# Patient Record
Sex: Female | Born: 1975 | Race: Black or African American | Hispanic: No | Marital: Single | Smoking: Never smoker
Health system: Southern US, Community
[De-identification: ages and names within clinical notes are randomized; demographics above are authoritative.]

## PROBLEM LIST (undated history)

## (undated) ENCOUNTER — Inpatient Hospital Stay (HOSPITAL_COMMUNITY): Payer: Self-pay

## (undated) DIAGNOSIS — Z8619 Personal history of other infectious and parasitic diseases: Secondary | ICD-10-CM

## (undated) DIAGNOSIS — D649 Anemia, unspecified: Secondary | ICD-10-CM

## (undated) DIAGNOSIS — B999 Unspecified infectious disease: Secondary | ICD-10-CM

## (undated) DIAGNOSIS — B379 Candidiasis, unspecified: Secondary | ICD-10-CM

## (undated) DIAGNOSIS — IMO0002 Reserved for concepts with insufficient information to code with codable children: Secondary | ICD-10-CM

## (undated) DIAGNOSIS — S3993XA Unspecified injury of pelvis, initial encounter: Secondary | ICD-10-CM

## (undated) DIAGNOSIS — R87619 Unspecified abnormal cytological findings in specimens from cervix uteri: Secondary | ICD-10-CM

## (undated) DIAGNOSIS — A599 Trichomoniasis, unspecified: Secondary | ICD-10-CM

## (undated) DIAGNOSIS — A749 Chlamydial infection, unspecified: Secondary | ICD-10-CM

## (undated) DIAGNOSIS — R102 Pelvic and perineal pain: Secondary | ICD-10-CM

## (undated) DIAGNOSIS — Z8742 Personal history of other diseases of the female genital tract: Secondary | ICD-10-CM

## (undated) HISTORY — PX: GYNECOLOGIC CRYOSURGERY: SHX857

## (undated) HISTORY — DX: Trichomoniasis, unspecified: A59.9

## (undated) HISTORY — DX: Unspecified infectious disease: B99.9

## (undated) HISTORY — DX: Unspecified injury of pelvis, initial encounter: S39.93XA

## (undated) HISTORY — DX: Personal history of other infectious and parasitic diseases: Z86.19

## (undated) HISTORY — DX: Candidiasis, unspecified: B37.9

## (undated) HISTORY — DX: Pelvic and perineal pain: R10.2

## (undated) HISTORY — PX: TUBAL LIGATION: SHX77

## (undated) HISTORY — DX: Anemia, unspecified: D64.9

## (undated) HISTORY — DX: Personal history of other diseases of the female genital tract: Z87.42

---

## 1998-11-13 ENCOUNTER — Emergency Department (HOSPITAL_COMMUNITY): Admission: EM | Admit: 1998-11-13 | Discharge: 1998-11-13 | Payer: Self-pay | Admitting: Emergency Medicine

## 1999-07-25 ENCOUNTER — Other Ambulatory Visit: Admission: RE | Admit: 1999-07-25 | Discharge: 1999-07-25 | Payer: Self-pay | Admitting: Obstetrics and Gynecology

## 1999-08-20 ENCOUNTER — Inpatient Hospital Stay (HOSPITAL_COMMUNITY): Admission: AD | Admit: 1999-08-20 | Discharge: 1999-08-20 | Payer: Self-pay | Admitting: Obstetrics and Gynecology

## 2000-02-14 ENCOUNTER — Inpatient Hospital Stay (HOSPITAL_COMMUNITY): Admission: AD | Admit: 2000-02-14 | Discharge: 2000-02-16 | Payer: Self-pay | Admitting: *Deleted

## 2000-08-12 ENCOUNTER — Other Ambulatory Visit: Admission: RE | Admit: 2000-08-12 | Discharge: 2000-08-12 | Payer: Self-pay | Admitting: Obstetrics and Gynecology

## 2001-03-20 ENCOUNTER — Emergency Department (HOSPITAL_COMMUNITY): Admission: EM | Admit: 2001-03-20 | Discharge: 2001-03-20 | Payer: Self-pay | Admitting: Internal Medicine

## 2002-02-10 ENCOUNTER — Emergency Department (HOSPITAL_COMMUNITY): Admission: EM | Admit: 2002-02-10 | Discharge: 2002-02-11 | Payer: Self-pay | Admitting: Emergency Medicine

## 2002-03-13 DIAGNOSIS — Z8742 Personal history of other diseases of the female genital tract: Secondary | ICD-10-CM

## 2002-03-13 HISTORY — DX: Personal history of other diseases of the female genital tract: Z87.42

## 2002-03-23 ENCOUNTER — Other Ambulatory Visit: Admission: RE | Admit: 2002-03-23 | Discharge: 2002-03-23 | Payer: Self-pay | Admitting: Obstetrics and Gynecology

## 2002-07-09 DIAGNOSIS — Z8742 Personal history of other diseases of the female genital tract: Secondary | ICD-10-CM

## 2002-07-09 HISTORY — DX: Personal history of other diseases of the female genital tract: Z87.42

## 2002-07-22 ENCOUNTER — Encounter: Payer: Self-pay | Admitting: Obstetrics and Gynecology

## 2002-07-22 ENCOUNTER — Ambulatory Visit (HOSPITAL_COMMUNITY): Admission: RE | Admit: 2002-07-22 | Discharge: 2002-07-22 | Payer: Self-pay | Admitting: Obstetrics and Gynecology

## 2002-11-11 ENCOUNTER — Encounter: Admission: RE | Admit: 2002-11-11 | Discharge: 2002-11-11 | Payer: Self-pay | Admitting: Family Medicine

## 2002-11-17 ENCOUNTER — Encounter: Admission: RE | Admit: 2002-11-17 | Discharge: 2002-11-17 | Payer: Self-pay | Admitting: Sports Medicine

## 2002-11-24 ENCOUNTER — Encounter: Admission: RE | Admit: 2002-11-24 | Discharge: 2002-11-24 | Payer: Self-pay | Admitting: Family Medicine

## 2002-12-25 ENCOUNTER — Encounter: Admission: RE | Admit: 2002-12-25 | Discharge: 2002-12-25 | Payer: Self-pay | Admitting: Family Medicine

## 2002-12-29 ENCOUNTER — Encounter: Admission: RE | Admit: 2002-12-29 | Discharge: 2002-12-29 | Payer: Self-pay | Admitting: Sports Medicine

## 2003-02-11 ENCOUNTER — Encounter: Admission: RE | Admit: 2003-02-11 | Discharge: 2003-02-11 | Payer: Self-pay | Admitting: Sports Medicine

## 2003-12-20 ENCOUNTER — Encounter: Admission: RE | Admit: 2003-12-20 | Discharge: 2003-12-20 | Payer: Self-pay | Admitting: Sports Medicine

## 2004-01-26 ENCOUNTER — Emergency Department (HOSPITAL_COMMUNITY): Admission: EM | Admit: 2004-01-26 | Discharge: 2004-01-26 | Payer: Self-pay | Admitting: Emergency Medicine

## 2004-08-08 ENCOUNTER — Emergency Department (HOSPITAL_COMMUNITY): Admission: EM | Admit: 2004-08-08 | Discharge: 2004-08-08 | Payer: Self-pay | Admitting: Family Medicine

## 2004-09-10 DIAGNOSIS — B999 Unspecified infectious disease: Secondary | ICD-10-CM

## 2004-09-10 HISTORY — DX: Unspecified infectious disease: B99.9

## 2004-09-22 ENCOUNTER — Emergency Department (HOSPITAL_COMMUNITY): Admission: EM | Admit: 2004-09-22 | Discharge: 2004-09-22 | Payer: Self-pay | Admitting: Emergency Medicine

## 2004-12-27 ENCOUNTER — Ambulatory Visit: Payer: Self-pay | Admitting: Sports Medicine

## 2005-01-02 ENCOUNTER — Encounter (INDEPENDENT_AMBULATORY_CARE_PROVIDER_SITE_OTHER): Payer: Self-pay | Admitting: Sports Medicine

## 2005-01-02 ENCOUNTER — Ambulatory Visit: Payer: Self-pay | Admitting: Sports Medicine

## 2005-07-21 ENCOUNTER — Emergency Department (HOSPITAL_COMMUNITY): Admission: EM | Admit: 2005-07-21 | Discharge: 2005-07-21 | Payer: Self-pay | Admitting: Family Medicine

## 2005-08-01 ENCOUNTER — Emergency Department (HOSPITAL_COMMUNITY): Admission: EM | Admit: 2005-08-01 | Discharge: 2005-08-01 | Payer: Self-pay | Admitting: Family Medicine

## 2005-08-20 ENCOUNTER — Inpatient Hospital Stay (HOSPITAL_COMMUNITY): Admission: AD | Admit: 2005-08-20 | Discharge: 2005-08-20 | Payer: Self-pay | Admitting: Obstetrics and Gynecology

## 2005-09-10 HISTORY — PX: WISDOM TOOTH EXTRACTION: SHX21

## 2005-09-26 ENCOUNTER — Other Ambulatory Visit: Admission: RE | Admit: 2005-09-26 | Discharge: 2005-09-26 | Payer: Self-pay | Admitting: Obstetrics and Gynecology

## 2006-04-14 ENCOUNTER — Inpatient Hospital Stay (HOSPITAL_COMMUNITY): Admission: AD | Admit: 2006-04-14 | Discharge: 2006-04-15 | Payer: Self-pay | Admitting: Obstetrics and Gynecology

## 2006-04-16 ENCOUNTER — Inpatient Hospital Stay (HOSPITAL_COMMUNITY): Admission: AD | Admit: 2006-04-16 | Discharge: 2006-04-16 | Payer: Self-pay | Admitting: Obstetrics and Gynecology

## 2006-04-18 ENCOUNTER — Inpatient Hospital Stay (HOSPITAL_COMMUNITY): Admission: AD | Admit: 2006-04-18 | Discharge: 2006-04-19 | Payer: Self-pay | Admitting: Obstetrics and Gynecology

## 2006-04-20 ENCOUNTER — Inpatient Hospital Stay (HOSPITAL_COMMUNITY): Admission: AD | Admit: 2006-04-20 | Discharge: 2006-04-23 | Payer: Self-pay | Admitting: Obstetrics and Gynecology

## 2006-08-10 ENCOUNTER — Encounter (INDEPENDENT_AMBULATORY_CARE_PROVIDER_SITE_OTHER): Payer: Self-pay | Admitting: *Deleted

## 2006-08-10 LAB — CONVERTED CEMR LAB

## 2006-08-27 ENCOUNTER — Ambulatory Visit: Payer: Self-pay | Admitting: Sports Medicine

## 2006-08-27 ENCOUNTER — Other Ambulatory Visit: Admission: RE | Admit: 2006-08-27 | Discharge: 2006-08-27 | Payer: Self-pay | Admitting: Sports Medicine

## 2006-08-27 ENCOUNTER — Encounter (INDEPENDENT_AMBULATORY_CARE_PROVIDER_SITE_OTHER): Payer: Self-pay | Admitting: Sports Medicine

## 2006-10-16 ENCOUNTER — Emergency Department (HOSPITAL_COMMUNITY): Admission: EM | Admit: 2006-10-16 | Discharge: 2006-10-16 | Payer: Self-pay | Admitting: Family Medicine

## 2006-11-08 ENCOUNTER — Encounter (INDEPENDENT_AMBULATORY_CARE_PROVIDER_SITE_OTHER): Payer: Self-pay | Admitting: *Deleted

## 2006-11-19 ENCOUNTER — Ambulatory Visit: Payer: Self-pay | Admitting: Family Medicine

## 2007-01-08 ENCOUNTER — Telehealth (INDEPENDENT_AMBULATORY_CARE_PROVIDER_SITE_OTHER): Payer: Self-pay | Admitting: *Deleted

## 2007-01-08 ENCOUNTER — Telehealth: Payer: Self-pay | Admitting: *Deleted

## 2007-01-09 ENCOUNTER — Encounter (INDEPENDENT_AMBULATORY_CARE_PROVIDER_SITE_OTHER): Payer: Self-pay | Admitting: *Deleted

## 2007-01-10 ENCOUNTER — Telehealth: Payer: Self-pay | Admitting: *Deleted

## 2007-02-07 ENCOUNTER — Encounter: Payer: Self-pay | Admitting: Family Medicine

## 2007-03-17 ENCOUNTER — Telehealth: Payer: Self-pay | Admitting: *Deleted

## 2007-04-01 ENCOUNTER — Ambulatory Visit: Payer: Self-pay | Admitting: Family Medicine

## 2007-04-01 ENCOUNTER — Telehealth (INDEPENDENT_AMBULATORY_CARE_PROVIDER_SITE_OTHER): Payer: Self-pay | Admitting: *Deleted

## 2007-04-01 LAB — CONVERTED CEMR LAB: Beta hcg, urine, semiquantitative: NEGATIVE

## 2007-08-14 ENCOUNTER — Emergency Department (HOSPITAL_COMMUNITY): Admission: EM | Admit: 2007-08-14 | Discharge: 2007-08-14 | Payer: Self-pay | Admitting: Emergency Medicine

## 2008-06-16 ENCOUNTER — Emergency Department (HOSPITAL_COMMUNITY): Admission: EM | Admit: 2008-06-16 | Discharge: 2008-06-16 | Payer: Self-pay | Admitting: Family Medicine

## 2008-06-16 ENCOUNTER — Inpatient Hospital Stay (HOSPITAL_COMMUNITY): Admission: AD | Admit: 2008-06-16 | Discharge: 2008-06-16 | Payer: Self-pay | Admitting: Obstetrics & Gynecology

## 2008-08-23 ENCOUNTER — Emergency Department (HOSPITAL_COMMUNITY): Admission: EM | Admit: 2008-08-23 | Discharge: 2008-08-23 | Payer: Self-pay | Admitting: Family Medicine

## 2008-09-29 ENCOUNTER — Inpatient Hospital Stay (HOSPITAL_COMMUNITY): Admission: AD | Admit: 2008-09-29 | Discharge: 2008-09-29 | Payer: Self-pay | Admitting: Obstetrics & Gynecology

## 2008-10-01 ENCOUNTER — Inpatient Hospital Stay (HOSPITAL_COMMUNITY): Admission: AD | Admit: 2008-10-01 | Discharge: 2008-10-01 | Payer: Self-pay | Admitting: Obstetrics & Gynecology

## 2008-10-22 ENCOUNTER — Ambulatory Visit: Payer: Self-pay | Admitting: Obstetrics and Gynecology

## 2008-10-22 LAB — CONVERTED CEMR LAB: hCG, Beta Chain, Quant, S: <2 milliintl units/mL

## 2008-12-27 ENCOUNTER — Encounter: Payer: Self-pay | Admitting: Family Medicine

## 2009-01-19 ENCOUNTER — Encounter: Admission: RE | Admit: 2009-01-19 | Discharge: 2009-01-19 | Payer: Self-pay | Admitting: Family Medicine

## 2009-01-19 ENCOUNTER — Ambulatory Visit: Payer: Self-pay | Admitting: Family Medicine

## 2009-01-19 ENCOUNTER — Encounter: Payer: Self-pay | Admitting: Family Medicine

## 2009-01-19 ENCOUNTER — Other Ambulatory Visit: Admission: RE | Admit: 2009-01-19 | Discharge: 2009-01-19 | Payer: Self-pay | Admitting: Family Medicine

## 2009-01-19 LAB — CONVERTED CEMR LAB: Beta hcg, urine, semiquantitative: NEGATIVE

## 2009-01-21 ENCOUNTER — Encounter: Payer: Self-pay | Admitting: Family Medicine

## 2009-03-17 ENCOUNTER — Inpatient Hospital Stay (HOSPITAL_COMMUNITY): Admission: AD | Admit: 2009-03-17 | Discharge: 2009-03-17 | Payer: Self-pay | Admitting: Obstetrics & Gynecology

## 2009-04-01 ENCOUNTER — Inpatient Hospital Stay (HOSPITAL_COMMUNITY): Admission: AD | Admit: 2009-04-01 | Discharge: 2009-04-01 | Payer: Self-pay | Admitting: Obstetrics & Gynecology

## 2009-04-17 ENCOUNTER — Inpatient Hospital Stay (HOSPITAL_COMMUNITY): Admission: AD | Admit: 2009-04-17 | Discharge: 2009-04-18 | Payer: Self-pay | Admitting: Obstetrics and Gynecology

## 2009-05-01 ENCOUNTER — Inpatient Hospital Stay (HOSPITAL_COMMUNITY): Admission: AD | Admit: 2009-05-01 | Discharge: 2009-05-01 | Payer: Self-pay | Admitting: Obstetrics and Gynecology

## 2009-06-20 ENCOUNTER — Encounter: Admission: RE | Admit: 2009-06-20 | Discharge: 2009-07-14 | Payer: Self-pay | Admitting: Obstetrics and Gynecology

## 2009-10-29 ENCOUNTER — Inpatient Hospital Stay (HOSPITAL_COMMUNITY): Admission: AD | Admit: 2009-10-29 | Discharge: 2009-10-29 | Payer: Self-pay | Admitting: Obstetrics and Gynecology

## 2009-11-02 ENCOUNTER — Inpatient Hospital Stay (HOSPITAL_COMMUNITY): Admission: AD | Admit: 2009-11-02 | Discharge: 2009-11-05 | Payer: Self-pay | Admitting: Obstetrics and Gynecology

## 2010-07-11 DIAGNOSIS — R102 Pelvic and perineal pain: Secondary | ICD-10-CM

## 2010-07-11 HISTORY — DX: Pelvic and perineal pain: R10.2

## 2010-10-01 ENCOUNTER — Encounter: Payer: Self-pay | Admitting: Obstetrics & Gynecology

## 2010-10-10 NOTE — Letter (Signed)
Summary: PAP Letter  [facility]  [address]   [place], [place] [address]   Phone: [phone]  Fax: [phone]    01/21/2009  [facility] [address] [place], [place]  [address]  Dear Ms. Augusta,   Your pap smear showed no sign of cervical cancer. Your next pap should be in 1 year.         Sincerely,   Dorcas Mcmurray MD  Appended Document: PAP Letter mailed

## 2010-10-10 NOTE — Consult Note (Signed)
Summary: [facility]  [facility]   Imported By: Drucie Ip 03/05/2007 13:37:14  _____________________________________________________________________  External Attachment:    Type:   Image     Comment:   External Document

## 2010-10-10 NOTE — Assessment & Plan Note (Signed)
Summary: MEET NEW DOC/PHYSICAL/[facility]   Vital Signs:  Patient profile:   35 year old female Height:      61 inches Weight:      158.4 pounds BMI:     30.04 Temp:     98 degrees F Pulse rate:   81 / minute BP sitting:   112 / 77  (left arm)  Vitals Entered By: Marcell Barlow RN (Jan 19, 2009 10:26 AM) CC: adult CPE, Pap Is Patient Diabetic? No Pain Assessment Patient in pain? no        History of Present Illness: Here for CPE had miscarriage in January 2010--was at about 6 weeks. No complications of that--is doing pretty well--that was unplanned but wanted pregnancy. Thought it might be time to update her pap and discuss mammogram  FH Breast CA: her Mom had breast Ca in her 20s--is still living at 64 yoa w no recurrence. She has never had mammogram.  Has recurrent issues with yeast vaginitis--especially in teh summer. It resolves w otc meds.  no other current issues  Habits & Providers     Tobacco Status: never  Past History:  Past Medical History:    chlamydia--3/06    Menarche at 8 yoa, had her first child at 62 and her second at 36. [id number].  Family History:    Mom breast canccer in her 68s. She is alive at 37 (in 2010) with no recurrence.    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  The patient denies anorexia, fever, weight loss, chest pain, syncope, dyspnea on exertion, abdominal pain, melena, hematochezia, severe indigestion/heartburn, genital sores, muscle weakness, depression, abnormal bleeding, enlarged lymph nodes, and breast masses.    Physical Exam  General:  alert, well-developed, well-nourished, well-hydrated, and overweight-appearing.   Eyes:  vision grossly intact, pupils equal, pupils round,  and pupils reactive to light.   Ears:  R ear normal and L ear normal.   Mouth:  pharynx pink and moist.   Neck:  supple, full ROM, no masses, and no thyromegaly.   Breasts:  skin/areolae normal, no masses, no abnormal thickening, no nipple discharge, and no tenderness.   Lungs:  normal respiratory effort and normal breath sounds.   Heart:  normal rate, regular rhythm, and no murmur.   Abdomen:  soft and non-tender.   Genitalia:  normal introitus, no external lesions, mucosa pink and moist, no vaginal or cervical lesions, and normal uterus size and position.   Msk:  normal ROM and no joint tenderness.   Extremities:  No clubbing, cyanosis, edema, or deformity noted with normal full range of motion of all joints.   Neurologic:  no focal deficits grossly Skin:  color normal and no suspicious lesions.   Psych:  Oriented X3, good eye contact, not anxious appearing, and not depressed appearing.     Impression & Recommendations:  Problem # 1:  Preventive Health Care (ICD-V70.0) Assessment Unchanged pap today recommend more regular exercise and calcium supplementation will give her some fluconazole to take 1 q week or 1 qoweek to see if we can help out w recurrent yeast vaginitis. Also some terrazol if breathru.  Problem #  2:  ADENOCARCINOMA, BREAST, FAMILY HX (ICD-V16.3)  Orders: Mammogram (Mammogram) [facility] - Est  18-39 yrs [id number]) long discussion about this issue, we figured (roughly) her lifeime risk to be >20% so will send her for screenijng mammo and then will re-discuss next year whether she want annual or other screening, also I offered sending her to breast center for more discussion. unclear specifics of her Moms breast CA--she has had no other cancers and is without recurrence 20 years later. Clinical breast exam today and discussed monthly SBE which I recommend for her.  Problem # 3:  CONTRACEPTIVE MANAGEMENT (ICD-V25.09) Assessment: Unchanged  Orders: Pap Smear-[facility]  ([id number]) [facility] - Est  18-39 yrs ([id number]) she does not want to consider anything other than condoms at this time.  Complete Medication List: 1)  Fluconazole 100 Mg Tabs (Fluconazole) .[name].. 1 by mouth q week 2)  Terazol 7 0.4 % Crea (Terconazole) .[name].. 1 applicator per vagina at bedtime for 7 days dispense one 7 day packet  Other Orders: U Preg-[facility] (21194)  Patient Instructions: 1)  I sent two presciptions to [facility] on [address] for your yeast infections that are recurrent. 2)  We will be setting up your mammogram. 3)  I will send you a note about your pap smear. I will see you in a year for another pap and a further discussion about your family history of breast cancer and appropriate screening. 4)  Nice to see you!  Prescriptions: TERAZOL 7 0.4 % CREA (TERCONAZOLE) 1 applicator per vagina at bedtime for 7 days dispense one 7 day packet  #1 x 5   Entered and Authorized by:   Dorcas Mcmurray MD   Signed by:   Dorcas Mcmurray MD on 01/19/2009   Method used:   Electronically to        [facility] [phone]* (retail)       [place], [place]  [address]       Ph: [phone]       Fax: [phone]   RxID:   [id number] FLUCONAZOLE 100 MG TABS (FLUCONAZOLE) 1 by mouth q week  #12 x 3   Entered and Authorized by:   Dorcas Mcmurray MD   Signed by:   Dorcas Mcmurray MD on 01/19/2009   Method used:   Electronically to        [facility] [phone]* (retail)       [address]       [place], [place]  [address]       Ph: [phone]       Fax: [phone]   RxID:   [id number]   Laboratory Results   Urine Tests  Date/Time Received: Jan 19, 2009 10:56 AM  Date/Time Reported: Jan 19, 2009 11:00 AM     Urine HCG: negative Comments: ...............test performed by......Marland KitchenBonnie A. Martin[place], [place] (ASCP)

## 2010-10-10 NOTE — Assessment & Plan Note (Signed)
Summary: Depo Injection  Nurse Visit       Medication Administration  Injection # 1:    Medication: Depo-Provera 124m    Diagnosis: CONTRACEPTIVE MANAGEMENT (ICD-V25.09)    Route: IM    Site: LUOQ gluteus    Exp Date: 01/10    Lot #: [id number]   Mfr: Sicor    Comments: Pt tolerated injection well, site unremarkable, injection given by DSharlyne Pacas SMA.    Patient tolerated injection without complications  Orders Added: 1)  Depo-Provera 1549m[J1055]

## 2010-10-10 NOTE — Progress Notes (Signed)
Summary: Referral to allergy specialist (Dr Gaynell Face)  Phone Note Call from Patient Call back at [phone]   Reason for Call: Referral Summary of Call: pt is wanting a referral to Dr Gaynell Face - states Dr Layne Benton knows about her allergy problems Initial call taken by: Drucie Ip,  January 08, 2007 2:20 PM  Follow-up for Phone Call        ok to refer to allergy specialist-- would refer to [facility] in [place].  dr. Hardie Pulley.  dr. Gaynell Face is a neurologist. Follow-up by: Wandra Feinstein MD,  Jan 09, 2007 11:23 AM  Additional Follow-up for Phone Call Additional follow up Details #1::        scheduled. see orders Additional Follow-up by: April Manson CMA,  Jan 09, 2007 2:54 PM

## 2010-10-10 NOTE — Progress Notes (Signed)
Summary: referral  Phone Note Call from Patient Call back at [phone]   Reason for Call: Talk to Nurse, Talk to Doctor Summary of Call: pt had requested a referral to the allergist but they couldn't see her until the end of May, pt wants to know if we can refer her anywhere else? Initial call taken by: ERIN LEVAN,  Jan 10, 2007 4:56 PM  Follow-up for Phone Call        ok with me to try another allergy specialist (nurse can pick out of book).  If cannot find one she can come in to see me or workin if urgent.  //[place] Follow-up by: Wandra Feinstein MD,  Jan 13, 2007 9:17 AM  Additional Follow-up for Phone Call Additional follow up Details #1::        CALLED PT AND ADVISED TO [place]. [place] APPT OR APPT WITH DR.BASSETT. PT AGREED. Additional Follow-up by: Mauricia Area CMA,,  Jan 13, 2007 3:07 PM

## 2010-10-10 NOTE — Consult Note (Signed)
Summary: [facility]  [facility]   Imported By: Audie Clear 01/19/2009 12:10:00  _____________________________________________________________________  External Attachment:    Type:   Image     Comment:   External Document

## 2010-10-10 NOTE — Miscellaneous (Signed)
Summary: Allergist appt  Appt with Dr. Orvil Feil [place]. [address] [place] May 30th, 2008 @ 9:00am No antihistamines 3-5 days prior to appt Pt's aunt Santiago Glad informed

## 2010-10-10 NOTE — Assessment & Plan Note (Signed)
Summary: pregnancy test/depo shot/neal/el  Nurse Visit     Laboratory Results   Urine Tests  Date/Time Recieved: April 01, 2007 3:00 PM     Urine HCG: negative Comments: ...................................................................[place] CMA  April 01, 2007 3:05 PM       Medication Administration  Injection # 1:    Medication: Depo-Provera 17m    Diagnosis: CONTRACEPTIVE MANAGEMENT (ICD-V25.09)    Route: IM    Site: RUOQ gluteus    Exp Date: 07/11/2009    Lot #: [id number]   Mfr: [email or web address]    Comments: To RTC 0ct 7th-oct 21st    Patient tolerated injection without complications    Given by: JChristen BameCMA (April 01, 2007 3:06 PM)  Orders Added: 1)  U Preg-[facility] [81025] 2)  Depo-Provera 1540m[J1055]

## 2010-10-10 NOTE — Progress Notes (Signed)
Summary: referral  Phone Note Call from Patient Call back at Home Phone [phone]   Reason for Call: Talk to Nurse Summary of Call: pt is requesting wi appt for her allergies, she wants a referral Initial call taken by: ERIN LEVAN,  January 08, 2007 2:12 PM  Follow-up for Phone Call        pt reports itchy watery eyes, runny nose with occasional bleeding. appointment scheduled tomorrow. Follow-up by: Marcell Barlow RN,  January 08, 2007 2:17 PM

## 2010-10-10 NOTE — Progress Notes (Signed)
Summary: Depo  Phone Note Call from Patient Call back at [phone]   Summary of Call: pt wants to speak with someone about being late for her depo Initial call taken by: Drucie Ip,  April 01, 2007 9:38 AM  Follow-up for Phone Call        Spoke with pt, states on menses @ this time and is late for her Depo. Instructed pt to make appt with nursing for U-preg if negative may receive Depo. Follow-up by: Dalbert Mayotte,  April 01, 2007 10:02 AM

## 2010-10-10 NOTE — Progress Notes (Signed)
Summary: Depo  Phone Note Call from Patient Call back at [phone]   Summary of Call: pt wants to speak with someone about being late for her depo Initial call taken by: Drucie Ip,  March 17, 2007 9:40 AM  Follow-up for Phone Call        Attempted above nimber, but it was the wrong one.  # in EMR was disconnected.  When pt calls back will inform her of need to take pregnacny test here and then we can give her the shot Follow-up by: [facility] CMA,  March 17, 2007 9:48 AM  Additional Follow-up for Phone Call Additional follow up Details #1::        attempted to call again Additional Follow-up by: [facility] CMA,  March 17, 2007 4:44 PM

## 2010-10-29 ENCOUNTER — Encounter: Payer: Self-pay | Admitting: *Deleted

## 2010-11-29 LAB — CBC
HCT: 30.4 % — ABNORMAL LOW (ref 36.0–46.0)
HCT: 36.5 % (ref 36.0–46.0)
Hemoglobin: 10.2 g/dL — ABNORMAL LOW (ref 12.0–15.0)
Hemoglobin: 12.1 g/dL (ref 12.0–15.0)
MCHC: 33 g/dL (ref 30.0–36.0)
MCHC: 33.4 g/dL (ref 30.0–36.0)
MCV: 88.8 fL (ref 78.0–100.0)
MCV: 90.2 fL (ref 78.0–100.0)
Platelets: 177 10*3/uL (ref 150–400)
Platelets: 201 10*3/uL (ref 150–400)
RBC: 3.37 MIL/uL — ABNORMAL LOW (ref 3.87–5.11)
RBC: 4.11 MIL/uL (ref 3.87–5.11)
RDW: 15.2 % (ref 11.5–15.5)
RDW: 15.5 % (ref 11.5–15.5)
WBC: 8.2 10*3/uL (ref 4.0–10.5)
WBC: 9.5 10*3/uL (ref 4.0–10.5)

## 2010-11-29 LAB — CCBB MATERNAL DONOR DRAW

## 2010-11-29 LAB — RPR: RPR Ser Ql: NONREACTIVE

## 2010-12-16 LAB — URINALYSIS, ROUTINE W REFLEX MICROSCOPIC
Bilirubin Urine: NEGATIVE
Bilirubin Urine: NEGATIVE
Glucose, UA: NEGATIVE mg/dL
Glucose, UA: NEGATIVE mg/dL
Hgb urine dipstick: NEGATIVE
Hgb urine dipstick: NEGATIVE
Ketones, ur: >80 mg/dL — AB
Ketones, ur: >80 mg/dL — AB
Nitrite: NEGATIVE
Nitrite: NEGATIVE
Protein, ur: NEGATIVE mg/dL
Protein, ur: NEGATIVE mg/dL
Specific Gravity, Urine: 1.02 (ref 1.005–1.030)
Specific Gravity, Urine: >1.03 — ABNORMAL HIGH (ref 1.005–1.030)
Urobilinogen, UA: 0.2 mg/dL (ref 0.0–1.0)
Urobilinogen, UA: 0.2 mg/dL (ref 0.0–1.0)
pH: 6.5 (ref 5.0–8.0)
pH: 6 (ref 5.0–8.0)

## 2010-12-16 LAB — COMPREHENSIVE METABOLIC PANEL
ALT: 10 U/L (ref 0–35)
AST: 14 U/L (ref 0–37)
Albumin: 3.7 g/dL (ref 3.5–5.2)
Alkaline Phosphatase: 60 U/L (ref 39–117)
BUN: 3 mg/dL — ABNORMAL LOW (ref 6–23)
CO2: 23 mEq/L (ref 19–32)
Calcium: 9.3 mg/dL (ref 8.4–10.5)
Chloride: 105 mEq/L (ref 96–112)
Creatinine, Ser: 0.61 mg/dL (ref 0.4–1.2)
GFR calc Af Amer: >60 mL/min (ref >60)
GFR calc non Af Amer: >60 mL/min (ref >60)
Glucose, Bld: 80 mg/dL (ref 70–99)
Potassium: 3.4 mEq/L — ABNORMAL LOW (ref 3.5–5.1)
Sodium: 135 mEq/L (ref 135–145)
Total Bilirubin: 0.4 mg/dL (ref 0.3–1.2)
Total Protein: 6.7 g/dL (ref 6.0–8.3)

## 2010-12-16 LAB — CBC
HCT: 35.4 % — ABNORMAL LOW (ref 36.0–46.0)
Hemoglobin: 12.1 g/dL (ref 12.0–15.0)
MCHC: 34.2 g/dL (ref 30.0–36.0)
MCV: 89.3 fL (ref 78.0–100.0)
Platelets: 176 10*3/uL (ref 150–400)
RBC: 3.96 MIL/uL (ref 3.87–5.11)
RDW: 12.2 % (ref 11.5–15.5)
WBC: 6.7 10*3/uL (ref 4.0–10.5)

## 2010-12-17 LAB — COMPREHENSIVE METABOLIC PANEL
ALT: 10 U/L (ref 0–35)
AST: 12 U/L (ref 0–37)
Albumin: 3.9 g/dL (ref 3.5–5.2)
Alkaline Phosphatase: 68 U/L (ref 39–117)
BUN: 5 mg/dL — ABNORMAL LOW (ref 6–23)
CO2: 24 mEq/L (ref 19–32)
Calcium: 9.1 mg/dL (ref 8.4–10.5)
Chloride: 102 mEq/L (ref 96–112)
Creatinine, Ser: 0.63 mg/dL (ref 0.4–1.2)
GFR calc Af Amer: >60 mL/min (ref >60)
GFR calc non Af Amer: >60 mL/min (ref >60)
Glucose, Bld: 81 mg/dL (ref 70–99)
Potassium: 3.5 mEq/L (ref 3.5–5.1)
Sodium: 135 mEq/L (ref 135–145)
Total Bilirubin: 0.3 mg/dL (ref 0.3–1.2)
Total Protein: 6.9 g/dL (ref 6.0–8.3)

## 2010-12-17 LAB — CBC
HCT: 34.7 % — ABNORMAL LOW (ref 36.0–46.0)
HCT: 37.2 % (ref 36.0–46.0)
Hemoglobin: 12.2 g/dL (ref 12.0–15.0)
Hemoglobin: 13 g/dL (ref 12.0–15.0)
MCHC: 35.2 g/dL (ref 30.0–36.0)
MCHC: 35 g/dL (ref 30.0–36.0)
MCV: 88.8 fL (ref 78.0–100.0)
MCV: 88.7 fL (ref 78.0–100.0)
Platelets: 187 10*3/uL (ref 150–400)
Platelets: 187 10*3/uL (ref 150–400)
RBC: 3.91 MIL/uL (ref 3.87–5.11)
RBC: 4.2 MIL/uL (ref 3.87–5.11)
RDW: 12.4 % (ref 11.5–15.5)
RDW: 13.1 % (ref 11.5–15.5)
WBC: 7.2 10*3/uL (ref 4.0–10.5)
WBC: 6.2 10*3/uL (ref 4.0–10.5)

## 2010-12-17 LAB — WET PREP, GENITAL
Trich, Wet Prep: NONE SEEN
Yeast Wet Prep HPF POC: NONE SEEN

## 2010-12-17 LAB — BASIC METABOLIC PANEL
BUN: 5 mg/dL — ABNORMAL LOW (ref 6–23)
CO2: 24 mEq/L (ref 19–32)
Calcium: 9.4 mg/dL (ref 8.4–10.5)
Chloride: 104 mEq/L (ref 96–112)
Creatinine, Ser: 0.77 mg/dL (ref 0.4–1.2)
GFR calc Af Amer: >60 mL/min (ref >60)
GFR calc non Af Amer: >60 mL/min (ref >60)
Glucose, Bld: 114 mg/dL — ABNORMAL HIGH (ref 70–99)
Potassium: 3.2 mEq/L — ABNORMAL LOW (ref 3.5–5.1)
Sodium: 136 mEq/L (ref 135–145)

## 2010-12-17 LAB — URINALYSIS, ROUTINE W REFLEX MICROSCOPIC
Bilirubin Urine: NEGATIVE
Bilirubin Urine: NEGATIVE
Glucose, UA: NEGATIVE mg/dL
Glucose, UA: NEGATIVE mg/dL
Hgb urine dipstick: NEGATIVE
Hgb urine dipstick: NEGATIVE
Ketones, ur: NEGATIVE mg/dL
Ketones, ur: NEGATIVE mg/dL
Nitrite: NEGATIVE
Nitrite: NEGATIVE
Protein, ur: NEGATIVE mg/dL
Protein, ur: NEGATIVE mg/dL
Specific Gravity, Urine: 1.01 (ref 1.005–1.030)
Specific Gravity, Urine: >1.03 — ABNORMAL HIGH (ref 1.005–1.030)
Urobilinogen, UA: 0.2 mg/dL (ref 0.0–1.0)
Urobilinogen, UA: 0.2 mg/dL (ref 0.0–1.0)
pH: 7 (ref 5.0–8.0)
pH: 5 (ref 5.0–8.0)

## 2010-12-17 LAB — POCT PREGNANCY, URINE
Preg Test, Ur: POSITIVE
Preg Test, Ur: POSITIVE

## 2010-12-17 LAB — GC/CHLAMYDIA PROBE AMP, GENITAL
Chlamydia, DNA Probe: NEGATIVE
GC Probe Amp, Genital: NEGATIVE

## 2010-12-25 LAB — CBC
HCT: 39 % (ref 36.0–46.0)
Hemoglobin: 13.1 g/dL (ref 12.0–15.0)
MCHC: 33.6 g/dL (ref 30.0–36.0)
MCV: 89.8 fL (ref 78.0–100.0)
Platelets: 197 10*3/uL (ref 150–400)
RBC: 4.34 MIL/uL (ref 3.87–5.11)
RDW: 13.3 % (ref 11.5–15.5)
WBC: 6 10*3/uL (ref 4.0–10.5)

## 2010-12-25 LAB — WET PREP, GENITAL
Trich, Wet Prep: NONE SEEN
Yeast Wet Prep HPF POC: NONE SEEN

## 2010-12-25 LAB — GC/CHLAMYDIA PROBE AMP, GENITAL
Chlamydia, DNA Probe: NEGATIVE
GC Probe Amp, Genital: NEGATIVE

## 2010-12-25 LAB — URINALYSIS, ROUTINE W REFLEX MICROSCOPIC
Bilirubin Urine: NEGATIVE
Glucose, UA: NEGATIVE mg/dL
Ketones, ur: NEGATIVE mg/dL
Leukocytes, UA: NEGATIVE
Nitrite: NEGATIVE
Protein, ur: NEGATIVE mg/dL
Specific Gravity, Urine: 1.01 (ref 1.005–1.030)
Urobilinogen, UA: 0.2 mg/dL (ref 0.0–1.0)
pH: 6 (ref 5.0–8.0)

## 2010-12-25 LAB — ABO/RH: ABO/RH(D): B POS

## 2010-12-25 LAB — URINE MICROSCOPIC-ADD ON

## 2010-12-25 LAB — HCG, QUANTITATIVE, PREGNANCY
hCG, Beta Chain, Quant, S: 819 m[IU]/mL — ABNORMAL HIGH (ref <5)
hCG, Beta Chain, Quant, S: 878 m[IU]/mL — ABNORMAL HIGH (ref <5)

## 2010-12-25 LAB — POCT PREGNANCY, URINE: Preg Test, Ur: POSITIVE

## 2011-01-26 NOTE — H&P (Signed)
[place]  Patient:    Erika David, Erika David                        MRN: [id number] Adm. Date:  [id number] Attending:  Beaulah Dinning Dictator:   Hansel Feinstein, C.N.M.                         History and Physical  HISTORY OF PRESENT ILLNESS:   Ms. Wojtas is a 35 year old single black female, gravida 1, para 0, at 39-1/7 weeks who presents with uterine contractions every  four to five minutes since 3 a.m.  She denies leaking or bleeding.  Denies nausea, vomiting, headache, or visual disturbances, and reports positive fetal movement. Pregnancy has been followed at [facility] by the certified nurse midwife service and has been at risk for:  1) First trimester weight loss.  2) History of LEEP procedure.  3) Group B Strep negative.  Her cervix on admission was 1.5 cm, 50% effaced, and -2 to -1 station.  Her cervix at this  time has progressed to 3+ cm, 90% effaced, and -1 to 0 station.  Therefore, she is admitted for labor.  PRENATAL LABORATORY DATA:     Hemoglobin 13.1, hematocrit 32.1, platelets 203. Blood type B positive, antibody screen negative, sickle cell trait negative, RPR nonreactive, rubella immune.  HBSAG negative, HIV declined.  Gonorrhea negative, Chlamydia negative.  AFP free beta within normal limits.  Glucose Challenge 108 and Group B Strep negative.  PAST MEDICAL HISTORY:         Remarkable for abnormal Pap smear in 1998 treated  with a LEEP procedure.  Normal Paps since then.  History of Trichomonas in 1998  which was treated.  Rare yeast infections.  Childhood varicella.  FAMILY HISTORY:               Remarkable for maternal grandmother with stroke and varicose veins and type 2 diabetes.  Mother with some type of thyroid dysfunction and breast cancer.  Maternal aunt with drug abuse.  GENETIC HISTORY:              Remarkable for sister with a heart murmur and a cousin with a hole in her  heart.  SOCIAL HISTORY:               The patient is single, father of the babys name is [facility] who is involved and supportive.  She has other family members here  with her today, however.  She works as a Chemical engineer. She denies any alcohol, tobacco, or drug use.  REVIEW OF SYSTEMS:            No somatic complaints in ENT, chest, abdomen, or extremities.  The patient reports painful uterine contractions since 3 a.m. with no leaking of fluid and positive fetal movement.  PHYSICAL EXAMINATION:  HEENT:                        Within normal limits.  Thyroid normal, not enlarged.  CHEST:                        Clear to auscultation bilaterally.  HEART:                        Regular rate and rhythm with no  murmurs.  ABDOMEN:                      Gravid at 39 cm.  NST is reactive with fetal heart rate in the 130s to 140s with positive accellerations and average variability. Ucx are every two to four minutes, moderate in strength.  PELVIC:                       Cervical examination is 3 cm, 90% effaced, and -1 to 0 station.  EXTREMITIES:                  Within normal limits.  ASSESSMENT:                   1. Intrauterine pregnancy at 39-1/7 weeks.                               2. Early labor.                               3. Group B Strep negative.  PLAN:                         1) Admit to birthing suite per consult with Eli Hose, M.D.  2) Routine C.N.M. orders.  3) Analgesia p.r.n.  4) Anticipate NSVD. DD:  02/14/00 TD:  02/14/00 Job: [id number]

## 2011-01-26 NOTE — H&P (Signed)
NAME:  Erika David, Erika David NO.:  [id number]   MEDICAL RECORD NO.:  [id number]          PATIENT TYPE:  INP   LOCATION:  9107                          FACILITY:  [facility]   PHYSICIAN:  Everett Graff, M.D.   DATE OF BIRTH:  10-Dec-1975   DATE OF ADMISSION:  04/20/2006  DATE OF DISCHARGE:                                HISTORY & PHYSICAL   HISTORY OF PRESENT ILLNESS:  Ms. Mastrogiovanni is a 35 year old single black female,  gravida 4, para 1-0-2-1 at 39-5/7 weeks who presents with leaking fluid  since 7:40 p.m. tonight.  She has also been having regular uterine  contractions every three minutes all day today.  She reports positive fetal  movement.  She denies nausea, vomiting, diarrhea.  She denies signs and  symptoms of PIH or bleeding.  Her pregnancy has been followed by the [facility] Certified Nurse Midwife service and has been remarkable for:  (1) history of abnormal Pap and LEEP in 1999, (2) positive gonorrhea and  chlamydia in November of 2006, and (3) positive group B strep.   PRENATAL LABS:  Prenatal labs were collected on September 26, 2005.  Hemoglobin 13.0, hematocrit 37.2, platelets 194,000.  Blood type B positive,  antibody negative, sickle cell trait negative, RPR nonreactive, rubella  immune, hepatitis B surface antigen negative, HIV nonreactive, pap smear  within normal limits, gonorrhea negative, chlamydia negative.  One-hour  Glucola from Jan 15, 2006 was 160.  RPR at that same time was nonreactive.  Hemoglobin at that time was 12.1.  Three-hour glucose tolerance test from  Feb 05, 2006 was within normal limits.  Culture of the vaginal tract for  group B strep on March 26, 2006 was positive.   HISTORY OF PRESENT PREGNANCY:  The patient presented for care at [facility] on September 26, 2005 at 10-3/[redacted] weeks gestation.  She reported being  treated for gonorrhea and chlamydia in November of 2006.  Ultrasonography at  18-2/[redacted] weeks gestation confirmed the  [facility] of April 22, 2006.  Anatomy scan  was complete.  One-hour Glucola was elevated, requiring a three-hour glucose  tolerance test which was within normal limits.  The patient had a  hydradenitis lesion on her mons pubis at [redacted] weeks gestation.  She also had  bacterial vaginosis at that time.  She was treated with Keflex and MetroGel.  Ultrasonography at [redacted] weeks gestation was done for measuring size greater  than dates.  Estimated fetal weight at that time was at the 69th percentile  with normal fluid.  The patient has been seen frequently during this past  week for prodromal labor and minimal cervix change.  Other than that, the  rest of her pregnancy has been unremarkable.   PAST OBSTETRICAL HISTORY:  She is a gravida 4, para 1-0-2-1.  In June of  2001, she had a vaginal delivery of a female infant weighing 6 pounds and 6  ounces at [redacted] weeks gestation after 12 hours in labor.  She had an epidural  for anesthesia.  There was meconium stained fluid at the birth.  The  infant's name was Lois Huxley and was delivered by Hansel Feinstein, Certified  Nurse Midwife.  In April of 2003, she had an EAB at 12 weeks.  In June of  2003, she had an SAB at 6 weeks.  This present pregnancy is with a different  paternity.   ALLERGIES:  SHE HAS NO MEDICATION ALLERGIES.   PAST GYNECOLOGICAL HISTORY:  She experienced menarche at the age of 68 with  28-30 day cycles lasting four to five days.  She had a certain last  menstrual period of July 16, 2005.  She has used Depo-Provera for two  years, stopped in 2003.  Has used condoms since then.  She had an abnormal  Pap and cryosurgery in 1999.  She was treated for gonorrhea and chlamydia in  November of 2006.   PAST MEDICAL HISTORY:  She reports having had the usual childhood illnesses.  In 1999, she chipped her hip bone and was on bedrest for two weeks at home.   PAST SURGICAL HISTORY:  She has no surgical history.   FAMILY HISTORY:  Remarkable for mother  and sister with chronic hypertension  and maternal grandmother and father with diabetes.  Maternal grandmother  with chronic renal disease.  Mother with breast cancer.  Maternal aunt with  unknown type of cancer.  Genetic history is limited of the father of the  baby and his family.  The patient's sister has a heart murmur.  Maternal  cousin died at 13 to 52 years of age of heart defect.   SOCIAL HISTORY:  The patient is single.  Father of the baby is involved per  patient's report, but the patient does not want his name on her prenatal  record.  The patient is a Interior and spatial designer.  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  Father of the baby education and  occupation are not given.  The patient is of the [place].  She denies  any alcohol, tobacco, or illicit drug use with the pregnancy.   PHYSICAL EXAMINATION:  VITAL SIGNS:  Stable.  She is afebrile.  HEENT:  Grossly within normal limits.  CHEST:  Clear to auscultation.  HEART:  Regular rate and rhythm.  ABDOMEN:  Gravid in contour with fundal height extending approximately 39 cm  above the pubic symphysis.  Fetal heart rate is reactive and reassuring with  occasional variables.  Contractions are every two to four minutes.  The  cervix is 3 cm, 60% effaced, vertex -2 which is unchanged from April 19, 2006.  There is copious meconium stained fluid present, moderate in quality.  EXTREMITIES:  Normal.   ASSESSMENT:  1. Intrauterine pregnancy at term.  2. Spontaneous rupture of membranes.  3. Meconium stained fluid.  4. Group B strep positive.   PLAN:  1. Admit to birthing suites per Dr. Mancel Bale.  2. Routine CNM orders.  3. Plan penicillin G for group B strep prophylaxis.  4. Patient plans epidural for labor.  5. Anticipate normal spontaneous vaginal birth.      Serita Grammes, C.N.M.      Everett Graff, M.D.  Electronically Signed   KS/MEDQ  D:  04/20/2006  T:  04/21/2006  Job:   [id number]

## 2011-03-27 ENCOUNTER — Ambulatory Visit (INDEPENDENT_AMBULATORY_CARE_PROVIDER_SITE_OTHER): Payer: Self-pay | Admitting: Family Medicine

## 2011-03-27 ENCOUNTER — Encounter: Payer: Self-pay | Admitting: Family Medicine

## 2011-03-27 DIAGNOSIS — Z309 Encounter for contraceptive management, unspecified: Secondary | ICD-10-CM

## 2011-03-27 DIAGNOSIS — K089 Disorder of teeth and supporting structures, unspecified: Secondary | ICD-10-CM

## 2011-03-27 DIAGNOSIS — K0889 Other specified disorders of teeth and supporting structures: Secondary | ICD-10-CM

## 2011-03-27 MED ORDER — TRAMADOL HCL 50 MG PO TABS: 50.0000 mg | ORAL_TABLET | Freq: Four times a day (QID) | ORAL | Status: AC | PRN

## 2011-03-27 MED ORDER — MEDROXYPROGESTERONE ACETATE 150 MG/ML IM SUSP: 150.0000 mg | INTRAMUSCULAR | Status: DC

## 2011-03-27 NOTE — Patient Instructions (Signed)
It was nice to meet you. Please meet with Erika David and schedule a dentist appointment as soon as you can. Take Ultram 1 tablet every 6 hrs as needed for pain. If you develop fever, chills, decreased appetite, difficulty swallowing, please return to clinic. Please return in 3 months for depo shot. You may schedule an annual physical exam at your earliest convenience. Thank you.

## 2011-03-28 DIAGNOSIS — K0889 Other specified disorders of teeth and supporting structures: Secondary | ICD-10-CM | POA: Insufficient documentation

## 2011-03-28 NOTE — Assessment & Plan Note (Signed)
Pain is moderate.  Will treat symptoms for now.  Patient has seen a dentist in the last 4 months who recommended surgery, but patient no longer has insurance.  She is applying for [facility] card and hopefully will be able to see a dentist soon.  No need for antibiotics now.  Will give Ultram prn pain.  RTC if symptoms worsen, she develops fever or signs of infection.

## 2011-03-28 NOTE — Progress Notes (Signed)
  Subjective:    Patient ID: Erika David, female    DOB: April 09, 1976, 35 y.o.   MRN: [id number]  HPI Patients is here to meet new MD.  Wants to discuss contraception management and toothache.  For contraception, patient undecided about method of choice.  Has tried Depo, Mirena, and OCP in the past, but IUDs gave her headaches and bleeding and she does not want to take a daily pill.  She prefers Depo, but afraid of weight gain.  Furthermore, she does not have insurance and is waiting for [facility] card.  She cannot afford Depo without it.  Dental pain has been going on for > 2 weeks.  She saw a dentist in February who recommended surgery.  Since then she has lost insurance.  Pain is currently 9/10.  Takes OTC Motrin daily with some relief.  Difficult to open jaw due to pain.  Able to tolerate soft, liquid diet.  No difficulty swallowing.  Denies difficulty breathing, fever, chills, night sweats, nausea/vomiting.   Review of Systems Per HPI    Objective:   Physical Exam  Constitutional: She is oriented to person, place, and time. She appears well-developed and well-nourished. No distress.  HENT:  Head: Normocephalic.  Mouth/Throat: No oral lesions. Dental caries present. No dental abscesses, uvula swelling or lacerations. Posterior oropharyngeal edema and posterior oropharyngeal erythema present. No oropharyngeal exudate or tonsillar abscesses.  Neck: Normal range of motion. Neck supple.  Cardiovascular: Normal rate and regular rhythm.  Exam reveals no gallop and no friction rub.   No murmur heard. Pulmonary/Chest: Effort normal and breath sounds normal. No respiratory distress. She has no wheezes. She has no rales.  Neurological: She is alert and oriented to person, place, and time.          Assessment & Plan:

## 2011-03-28 NOTE — Assessment & Plan Note (Signed)
Patient unsure about which contraceptive device to start, but she does not have any insurance at this time.  If patient receives [facility] card, then she can RTC for depo shot.  She agreed to the plan.  Condom use in the meantime.

## 2011-06-11 LAB — WET PREP, GENITAL
Clue Cells Wet Prep HPF POC: NONE SEEN
Trich, Wet Prep: NONE SEEN
Yeast Wet Prep HPF POC: NONE SEEN

## 2011-06-11 LAB — GC/CHLAMYDIA PROBE AMP, GENITAL
Chlamydia, DNA Probe: NEGATIVE
GC Probe Amp, Genital: NEGATIVE

## 2011-06-11 LAB — POCT PREGNANCY, URINE
Preg Test, Ur: NEGATIVE
Preg Test, Ur: NEGATIVE

## 2011-06-18 LAB — WET PREP, GENITAL: Trich, Wet Prep: NONE SEEN

## 2011-06-18 LAB — POCT URINALYSIS DIP (DEVICE)
Bilirubin Urine: NEGATIVE
Glucose, UA: NEGATIVE
Hgb urine dipstick: NEGATIVE
Ketones, ur: NEGATIVE
Nitrite: NEGATIVE
Operator id: 239701
Protein, ur: NEGATIVE
Specific Gravity, Urine: 1.025
Urobilinogen, UA: 0.2
pH: 6

## 2011-06-18 LAB — GC/CHLAMYDIA PROBE AMP, GENITAL
Chlamydia, DNA Probe: NEGATIVE
GC Probe Amp, Genital: NEGATIVE

## 2011-06-18 LAB — POCT PREGNANCY, URINE
Operator id: 239701
Preg Test, Ur: NEGATIVE

## 2011-09-19 ENCOUNTER — Inpatient Hospital Stay (HOSPITAL_COMMUNITY)
Admission: AD | Admit: 2011-09-19 | Discharge: 2011-09-19 | Disposition: A | Discharge status: Home / Self Care | Payer: Self-pay | Source: Ambulatory Visit | Attending: Obstetrics & Gynecology | Admitting: Obstetrics & Gynecology

## 2011-09-19 ENCOUNTER — Encounter (HOSPITAL_COMMUNITY): Payer: Self-pay | Admitting: *Deleted

## 2011-09-19 DIAGNOSIS — O210 Mild hyperemesis gravidarum: Secondary | ICD-10-CM | POA: Insufficient documentation

## 2011-09-19 DIAGNOSIS — O219 Vomiting of pregnancy, unspecified: Secondary | ICD-10-CM

## 2011-09-19 HISTORY — DX: Chlamydial infection, unspecified: A74.9

## 2011-09-19 LAB — URINALYSIS, ROUTINE W REFLEX MICROSCOPIC
Bilirubin Urine: NEGATIVE
Glucose, UA: NEGATIVE mg/dL
Hgb urine dipstick: NEGATIVE
Ketones, ur: NEGATIVE mg/dL
Leukocytes, UA: NEGATIVE
Nitrite: NEGATIVE
Protein, ur: NEGATIVE mg/dL
Specific Gravity, Urine: 1.02 (ref 1.005–1.030)
Urobilinogen, UA: 0.2 mg/dL (ref 0.0–1.0)
pH: 7.5 (ref 5.0–8.0)

## 2011-09-19 LAB — POCT PREGNANCY, URINE: Preg Test, Ur: POSITIVE

## 2011-09-19 MED ORDER — PROMETHAZINE HCL 25 MG PO TABS
12.5000 mg | ORAL_TABLET | ORAL | Status: AC
  Administered 2011-09-19: 12.5 mg via ORAL
  Filled 2011-09-19: qty 1

## 2011-09-19 MED ORDER — PROMETHAZINE HCL 12.5 MG PO TABS: 12.5000 mg | ORAL_TABLET | Freq: Four times a day (QID) | ORAL | Status: AC | PRN

## 2011-09-19 NOTE — Progress Notes (Signed)
Patient states she has been vomiting since 1-3. Started having cramping on 1-7. Has a headache for a couple of hours. No bleeding but a little more vaginal discharge than usual. Has had a positive home pregnancy test. Was taking OCP's.

## 2011-10-16 ENCOUNTER — Inpatient Hospital Stay (HOSPITAL_COMMUNITY)
Admission: AD | Admit: 2011-10-16 | Discharge: 2011-10-16 | Disposition: A | Discharge status: Home / Self Care | Payer: Medicaid Other | Source: Ambulatory Visit | Attending: Obstetrics & Gynecology | Admitting: Obstetrics & Gynecology

## 2011-10-16 ENCOUNTER — Encounter (HOSPITAL_COMMUNITY): Payer: Self-pay | Admitting: *Deleted

## 2011-10-16 DIAGNOSIS — K089 Disorder of teeth and supporting structures, unspecified: Secondary | ICD-10-CM

## 2011-10-16 DIAGNOSIS — K0889 Other specified disorders of teeth and supporting structures: Secondary | ICD-10-CM

## 2011-10-16 DIAGNOSIS — R112 Nausea with vomiting, unspecified: Secondary | ICD-10-CM

## 2011-10-16 DIAGNOSIS — O210 Mild hyperemesis gravidarum: Secondary | ICD-10-CM | POA: Insufficient documentation

## 2011-10-16 DIAGNOSIS — O99891 Other specified diseases and conditions complicating pregnancy: Secondary | ICD-10-CM | POA: Insufficient documentation

## 2011-10-16 LAB — URINALYSIS, ROUTINE W REFLEX MICROSCOPIC
Bilirubin Urine: NEGATIVE
Glucose, UA: NEGATIVE mg/dL
Hgb urine dipstick: NEGATIVE
Ketones, ur: NEGATIVE mg/dL
Leukocytes, UA: NEGATIVE
Nitrite: NEGATIVE
Protein, ur: NEGATIVE mg/dL
Specific Gravity, Urine: 1.01 (ref 1.005–1.030)
Urobilinogen, UA: 0.2 mg/dL (ref 0.0–1.0)
pH: 7 (ref 5.0–8.0)

## 2011-10-16 LAB — COMPREHENSIVE METABOLIC PANEL
ALT: 7 U/L (ref 0–35)
AST: 9 U/L (ref 0–37)
Albumin: 3.7 g/dL (ref 3.5–5.2)
Alkaline Phosphatase: 63 U/L (ref 39–117)
BUN: 5 mg/dL — ABNORMAL LOW (ref 6–23)
CO2: 24 mEq/L (ref 19–32)
Calcium: 9.7 mg/dL (ref 8.4–10.5)
Chloride: 100 mEq/L (ref 96–112)
Creatinine, Ser: 0.6 mg/dL (ref 0.50–1.10)
GFR calc Af Amer: >90 mL/min (ref >90)
GFR calc non Af Amer: >90 mL/min (ref >90)
Glucose, Bld: 88 mg/dL (ref 70–99)
Potassium: 4 mEq/L (ref 3.5–5.1)
Sodium: 134 mEq/L — ABNORMAL LOW (ref 135–145)
Total Bilirubin: 0.1 mg/dL — ABNORMAL LOW (ref 0.3–1.2)
Total Protein: 7.2 g/dL (ref 6.0–8.3)

## 2011-10-16 LAB — CBC
HCT: 36.8 % (ref 36.0–46.0)
Hemoglobin: 12.7 g/dL (ref 12.0–15.0)
MCH: 29.2 pg (ref 26.0–34.0)
MCHC: 34.5 g/dL (ref 30.0–36.0)
MCV: 84.6 fL (ref 78.0–100.0)
Platelets: 222 10*3/uL (ref 150–400)
RBC: 4.35 MIL/uL (ref 3.87–5.11)
RDW: 12.9 % (ref 11.5–15.5)
WBC: 6.8 10*3/uL (ref 4.0–10.5)

## 2011-10-16 MED ORDER — PROMETHAZINE HCL 25 MG PO TABS: 25.0000 mg | ORAL_TABLET | Freq: Four times a day (QID) | ORAL | Status: AC | PRN

## 2011-10-16 MED ORDER — ONDANSETRON 4 MG PO TBDP
4.0000 mg | ORAL_TABLET | Freq: Once | ORAL | Status: AC
  Administered 2011-10-16: 4 mg via ORAL
  Filled 2011-10-16: qty 1

## 2011-10-16 MED ORDER — ACETAMINOPHEN 500 MG PO TABS
1000.0000 mg | ORAL_TABLET | Freq: Once | ORAL | Status: AC
  Administered 2011-10-16: 1000 mg via ORAL

## 2011-10-16 NOTE — ED Provider Notes (Signed)
History   Pt presents today c/o severe N&V. She states she has been vomiting for the past 4wks and today she has vomited at least 7 times. She states she can no longer keep anything on her stomach. She denies fever, abd pain, vag dc, bleeding, or any other problems at this time.  Chief Complaint  Patient presents with  . Morning Sickness   HPI  OB History    Grav Para Term Preterm Abortions TAB SAB Ect Mult Living   6 3 3  2 1 1   3       Past Medical History  Diagnosis Date  . Chlamydia     Past Surgical History  Procedure Date  . Gynecologic cryosurgery   . Wisdom tooth extraction 2007    History reviewed. No pertinent family history.  History  Substance Use Topics  . Smoking status: Never Smoker   . Smokeless tobacco: Never Used  . Alcohol Use: Yes     occasional alcohol    Allergies: No Known Allergies  No prescriptions prior to admission    Review of Systems  Constitutional: Negative for fever and chills.  Eyes: Negative for blurred vision and double vision.  Respiratory: Negative for cough, hemoptysis, sputum production, shortness of breath and wheezing.   Cardiovascular: Negative for chest pain and palpitations.  Gastrointestinal: Positive for nausea and vomiting. Negative for abdominal pain, diarrhea and constipation.  Genitourinary: Negative for dysuria, urgency, frequency and hematuria.  Neurological: Negative for dizziness and headaches.  Psychiatric/Behavioral: Negative for depression and suicidal ideas.   Physical Exam   Blood pressure 118/80, pulse 93, temperature 98.9 F (37.2 C), temperature source Oral, resp. rate 16, height 5' 1.5" (1.562 m), weight 153 lb (69.4 kg), last menstrual period 07/27/2011.  Physical Exam  Nursing note and vitals reviewed. Constitutional: She is oriented to person, place, and time. She appears well-developed and well-nourished. No distress.  HENT:  Head: Normocephalic and atraumatic.  Eyes: EOM are normal. Pupils  are equal, round, and reactive to light.  GI: Soft. She exhibits no distension and no mass. There is no tenderness. There is no rebound and no guarding.  Neurological: She is alert and oriented to person, place, and time.  Skin: Skin is warm and dry. She is not diaphoretic.  Psychiatric: She has a normal mood and affect. Her behavior is normal. Judgment and thought content normal.    MAU Course  Procedures  Results for orders placed during the hospital encounter of 10/16/11 (from the past 72 hour(s))  URINALYSIS, ROUTINE W REFLEX MICROSCOPIC     Status: Normal   Collection Time   10/16/11  2:43 PM      Component Value Range Comment   Color, Urine YELLOW  YELLOW     APPearance CLEAR  CLEAR     Specific Gravity, Urine 1.010  1.005 - 1.030     pH 7.0  5.0 - 8.0     Glucose, UA NEGATIVE  NEGATIVE (mg/dL)    Hgb urine dipstick NEGATIVE  NEGATIVE     Bilirubin Urine NEGATIVE  NEGATIVE     Ketones, ur NEGATIVE  NEGATIVE (mg/dL)    Protein, ur NEGATIVE  NEGATIVE (mg/dL)    Urobilinogen, UA 0.2  0.0 - 1.0 (mg/dL)    Nitrite NEGATIVE  NEGATIVE     Leukocytes, UA NEGATIVE  NEGATIVE  MICROSCOPIC NOT DONE ON URINES WITH NEGATIVE PROTEIN, BLOOD, LEUKOCYTES, NITRITE, OR GLUCOSE <1000 mg/dL.  CBC     Status: Normal  Collection Time   10/16/11  3:28 PM      Component Value Range Comment   WBC 6.8  4.0 - 10.5 (K/uL)    RBC 4.35  3.87 - 5.11 (MIL/uL)    Hemoglobin 12.7  12.0 - 15.0 (g/dL)    HCT 36.8  36.0 - 46.0 (%)    MCV 84.6  78.0 - 100.0 (fL)    MCH 29.2  26.0 - 34.0 (pg)    MCHC 34.5  30.0 - 36.0 (g/dL)    RDW 12.9  11.5 - 15.5 (%)    Platelets 222  150 - 400 (K/uL)   COMPREHENSIVE METABOLIC PANEL     Status: Abnormal   Collection Time   10/16/11  3:28 PM      Component Value Range Comment   Sodium 134 (*) 135 - 145 (mEq/L)    Potassium 4.0  3.5 - 5.1 (mEq/L)    Chloride 100  96 - 112 (mEq/L)    CO2 24  19 - 32 (mEq/L)    Glucose, Bld 88  70 - 99 (mg/dL)    BUN 5 (*) 6 - 23 (mg/dL)     Creatinine, Ser 0.60  0.50 - 1.10 (mg/dL)    Calcium 9.7  8.4 - 10.5 (mg/dL)    Total Protein 7.2  6.0 - 8.3 (g/dL)    Albumin 3.7  3.5 - 5.2 (g/dL)    AST 9  0 - 37 (U/L)    ALT 7  0 - 35 (U/L)    Alkaline Phosphatase 63  39 - 117 (U/L)    Total Bilirubin 0.1 (*) 0.3 - 1.2 (mg/dL)    GFR calc non Af Amer >90  >90 (mL/min)    GFR calc Af Amer >90  >90 (mL/min)     Pt feels improved following Zofran.  Assessment and Plan  N&V: discussed with pt at length. Will give Rx for phenergan. She may use these vaginally. Discussed adequate hydration. Discussed diet, activity, risks, and precautions.  Tooth pain: pt has already seen a dentist and was told she needed a root-canal. She states she needs a referral. Will give written referral. She will continue to take her Tylenol 3 and amoxicillin as directed. Discussed diet, activity, risks, and precautions.  Alinda Deem. Marvon Shillingburg III, DrHSc, MPAS, PA-C  10/16/2011, 3:12 PM   Eliott Nine, PA 10/16/11 [phone]

## 2011-10-16 NOTE — Progress Notes (Signed)
Pt states, " I've been vomiting ever since I found out I was pregnant. I've vomited five times today."

## 2011-10-17 NOTE — ED Provider Notes (Signed)
Attestation of Attending Supervision of Advanced Practitioner: Evaluation and management procedures were performed by the PA/NP/CNM/OB Fellow under my supervision/collaboration. Chart reviewed, and agree with management and plan.  Verita Schneiders, M.D. 10/17/2011 9:42 AM

## 2011-11-20 ENCOUNTER — Inpatient Hospital Stay (HOSPITAL_COMMUNITY)
Admission: AD | Admit: 2011-11-20 | Discharge: 2011-11-20 | Disposition: A | Discharge status: Home / Self Care | Payer: Medicaid Other | Source: Ambulatory Visit | Attending: Obstetrics and Gynecology | Admitting: Obstetrics and Gynecology

## 2011-11-20 ENCOUNTER — Encounter (HOSPITAL_COMMUNITY): Payer: Self-pay

## 2011-11-20 DIAGNOSIS — O210 Mild hyperemesis gravidarum: Secondary | ICD-10-CM | POA: Insufficient documentation

## 2011-11-20 DIAGNOSIS — A0811 Acute gastroenteropathy due to Norwalk agent: Secondary | ICD-10-CM

## 2011-11-20 DIAGNOSIS — A088 Other specified intestinal infections: Secondary | ICD-10-CM | POA: Insufficient documentation

## 2011-11-20 DIAGNOSIS — O99891 Other specified diseases and conditions complicating pregnancy: Secondary | ICD-10-CM | POA: Insufficient documentation

## 2011-11-20 LAB — URINALYSIS, ROUTINE W REFLEX MICROSCOPIC
Bilirubin Urine: NEGATIVE
Glucose, UA: NEGATIVE mg/dL
Hgb urine dipstick: NEGATIVE
Ketones, ur: 40 mg/dL — AB
Leukocytes, UA: NEGATIVE
Nitrite: NEGATIVE
Protein, ur: NEGATIVE mg/dL
Specific Gravity, Urine: 1.015 (ref 1.005–1.030)
Urobilinogen, UA: 1 mg/dL (ref 0.0–1.0)
pH: 6.5 (ref 5.0–8.0)

## 2011-11-20 LAB — COMPREHENSIVE METABOLIC PANEL
ALT: 6 U/L (ref 0–35)
AST: 11 U/L (ref 0–37)
Albumin: 3.5 g/dL (ref 3.5–5.2)
Alkaline Phosphatase: 78 U/L (ref 39–117)
BUN: 6 mg/dL (ref 6–23)
CO2: 22 mEq/L (ref 19–32)
Calcium: 9.1 mg/dL (ref 8.4–10.5)
Chloride: 100 mEq/L (ref 96–112)
Creatinine, Ser: 0.59 mg/dL (ref 0.50–1.10)
GFR calc Af Amer: >90 mL/min (ref >90)
GFR calc non Af Amer: >90 mL/min (ref >90)
Glucose, Bld: 87 mg/dL (ref 70–99)
Potassium: 3.6 mEq/L (ref 3.5–5.1)
Sodium: 133 mEq/L — ABNORMAL LOW (ref 135–145)
Total Bilirubin: 0.2 mg/dL — ABNORMAL LOW (ref 0.3–1.2)
Total Protein: 7 g/dL (ref 6.0–8.3)

## 2011-11-20 LAB — DIFFERENTIAL
Basophils Absolute: 0 10*3/uL (ref 0.0–0.1)
Basophils Relative: 0 % (ref 0–1)
Eosinophils Absolute: 0.1 10*3/uL (ref 0.0–0.7)
Eosinophils Relative: 1 % (ref 0–5)
Lymphocytes Relative: 10 % — ABNORMAL LOW (ref 12–46)
Lymphs Abs: 0.7 10*3/uL (ref 0.7–4.0)
Monocytes Absolute: 0.4 10*3/uL (ref 0.1–1.0)
Monocytes Relative: 6 % (ref 3–12)
Neutro Abs: 6.2 10*3/uL (ref 1.7–7.7)
Neutrophils Relative %: 84 % — ABNORMAL HIGH (ref 43–77)

## 2011-11-20 LAB — CBC
HCT: 33.8 % — ABNORMAL LOW (ref 36.0–46.0)
Hemoglobin: 11.8 g/dL — ABNORMAL LOW (ref 12.0–15.0)
MCH: 29.6 pg (ref 26.0–34.0)
MCHC: 34.9 g/dL (ref 30.0–36.0)
MCV: 84.9 fL (ref 78.0–100.0)
Platelets: 203 10*3/uL (ref 150–400)
RBC: 3.98 MIL/uL (ref 3.87–5.11)
RDW: 13.6 % (ref 11.5–15.5)
WBC: 7.4 10*3/uL (ref 4.0–10.5)

## 2011-11-20 MED ORDER — LOPERAMIDE HCL 2 MG PO CAPS: 2.0000 mg | ORAL_CAPSULE | ORAL | Status: AC | PRN

## 2011-11-20 MED ORDER — LOPERAMIDE HCL 2 MG PO CAPS
2.0000 mg | ORAL_CAPSULE | ORAL | Status: AC
  Administered 2011-11-20: 2 mg via ORAL
  Filled 2011-11-20: qty 1

## 2011-11-20 MED ORDER — ACETAMINOPHEN 325 MG PO TABS
650.0000 mg | ORAL_TABLET | Freq: Four times a day (QID) | ORAL | Status: DC | PRN
  Administered 2011-11-20: 650 mg via ORAL
  Filled 2011-11-20: qty 2

## 2011-11-20 MED ORDER — LACTATED RINGERS IV BOLUS (SEPSIS)
500.0000 mL | Freq: Once | INTRAVENOUS | Status: AC
  Administered 2011-11-20: 1000 mL via INTRAVENOUS

## 2011-11-20 MED ORDER — LOPERAMIDE HCL 2 MG PO CAPS: 2.0000 mg | ORAL_CAPSULE | ORAL | Status: DC | PRN

## 2011-11-20 MED ORDER — ONDANSETRON HCL 4 MG PO TABS: 4.0000 mg | ORAL_TABLET | Freq: Three times a day (TID) | ORAL | Status: DC | PRN

## 2011-11-20 MED ORDER — ONDANSETRON HCL 4 MG/2ML IJ SOLN
4.0000 mg | Freq: Once | INTRAMUSCULAR | Status: AC
  Administered 2011-11-20: 4 mg via INTRAVENOUS
  Filled 2011-11-20: qty 2

## 2011-11-20 MED ORDER — LACTATED RINGERS IV SOLN
INTRAVENOUS | Status: DC
Start: 2011-11-20 — End: 2011-11-20
  Administered 2011-11-20: 12:00:00 via INTRAVENOUS

## 2011-11-20 MED ORDER — ONDANSETRON HCL 4 MG PO TABS
4.0000 mg | ORAL_TABLET | Freq: Three times a day (TID) | ORAL | Status: AC | PRN
Start: 2011-11-20 — End: 2011-11-27

## 2011-11-20 NOTE — MAU Note (Signed)
History   36 yo [id number] at 18 4/7 weeks presented unannounced c/o onset last night of N/V/D--no other family members sick with similar symptoms.  Unsure if fever, denies leaking, bleeding, discharge, or any other symptoms. Has not started prenatal care with [facility] yet, but has appointment at end of March for NOB work-up.  Pregnancy remarkable for: Cryosurgery Remote hx chlamydia  Chief Complaint  Patient presents with  . Emesis  . Diarrhea   @SFHPI @  OB History    Grav Para Term Preterm Abortions TAB SAB Ect Mult Living   6 3 3  2 1 1   3       Past Medical History  Diagnosis Date  . Chlamydia     Past Surgical History  Procedure Date  . Gynecologic cryosurgery   . Wisdom tooth extraction 2007    Family History  Problem Relation Age of Onset  . Anesthesia problems Neg Hx     History  Substance Use Topics  . Smoking status: Never Smoker   . Smokeless tobacco: Never Used  . Alcohol Use: No     occasional alcohol    Allergies: No Known Allergies  Prescriptions prior to admission  Medication Sig Dispense Refill  . acetaminophen (TYLENOL) 325 MG tablet Take 650 mg by mouth every 6 (six) hours as needed. For headache.        Physical Exam   Blood pressure 117/67, pulse 102, temperature 98.9 F (37.2 C), temperature source Oral, resp. rate 16, height 5' 1.5" (1.562 m), last menstrual period 07/27/2011.  Chest clear Heart RRR without murmur Abd gravid, NT Back negative CVAT Pelvic--cervix closed, long, NT, no discharge FHR 150s by doppler  Results for orders placed during the hospital encounter of 11/20/11 (from the past 24 hour(s))  URINALYSIS, ROUTINE W REFLEX MICROSCOPIC     Status: Abnormal   Collection Time   11/20/11  8:50 AM      Component Value Range   Color, Urine YELLOW  YELLOW    APPearance CLEAR  CLEAR    Specific Gravity, Urine 1.015  1.005 - 1.030    pH 6.5  5.0 - 8.0    Glucose, UA NEGATIVE  NEGATIVE (mg/dL)   Hgb urine dipstick NEGATIVE   NEGATIVE    Bilirubin Urine NEGATIVE  NEGATIVE    Ketones, ur 40 (*) NEGATIVE (mg/dL)   Protein, ur NEGATIVE  NEGATIVE (mg/dL)   Urobilinogen, UA 1.0  0.0 - 1.0 (mg/dL)   Nitrite NEGATIVE  NEGATIVE    Leukocytes, UA NEGATIVE  NEGATIVE   CBC     Status: Abnormal   Collection Time   11/20/11  9:08 AM      Component Value Range   WBC 7.4  4.0 - 10.5 (K/uL)   RBC 3.98  3.87 - 5.11 (MIL/uL)   Hemoglobin 11.8 (*) 12.0 - 15.0 (g/dL)   HCT 33.8 (*) 36.0 - 46.0 (%)   MCV 84.9  78.0 - 100.0 (fL)   MCH 29.6  26.0 - 34.0 (pg)   MCHC 34.9  30.0 - 36.0 (g/dL)   RDW 13.6  11.5 - 15.5 (%)   Platelets 203  150 - 400 (K/uL)  DIFFERENTIAL     Status: Abnormal   Collection Time   11/20/11  9:08 AM      Component Value Range   Neutrophils Relative 84 (*) 43 - 77 (%)   Neutro Abs 6.2  1.7 - 7.7 (K/uL)   Lymphocytes Relative 10 (*) 12 - 46 (%)  Lymphs Abs 0.7  0.7 - 4.0 (K/uL)   Monocytes Relative 6  3 - 12 (%)   Monocytes Absolute 0.4  0.1 - 1.0 (K/uL)   Eosinophils Relative 1  0 - 5 (%)   Eosinophils Absolute 0.1  0.0 - 0.7 (K/uL)   Basophils Relative 0  0 - 1 (%)   Basophils Absolute 0.0  0.0 - 0.1 (K/uL)  COMPREHENSIVE METABOLIC PANEL     Status: Abnormal   Collection Time   11/20/11  9:08 AM      Component Value Range   Sodium 133 (*) 135 - 145 (mEq/L)   Potassium 3.6  3.5 - 5.1 (mEq/L)   Chloride 100  96 - 112 (mEq/L)   CO2 22  19 - 32 (mEq/L)   Glucose, Bld 87  70 - 99 (mg/dL)   BUN 6  6 - 23 (mg/dL)   Creatinine, Ser 0.59  0.50 - 1.10 (mg/dL)   Calcium 9.1  8.4 - 10.5 (mg/dL)   Total Protein 7.0  6.0 - 8.3 (g/dL)   Albumin 3.5  3.5 - 5.2 (g/dL)   AST 11  0 - 37 (U/L)   ALT 6  0 - 35 (U/L)   Alkaline Phosphatase 78  39 - 117 (U/L)   Total Bilirubin 0.2 (*) 0.3 - 1.2 (mg/dL)   GFR calc non Af Amer >90  >90 (mL/min)   GFR calc Af Amer >90  >90 (mL/min)     ED Course  IUP at 16 4/7 weeks Probably viral gastroenteritis (currently local norovirus outbreak)  Plan: IV  hydration Zofran Immodium Tylenol for headache Re-evaluate after fluids and meds.  Donnel Saxon, CNM, [place] 11/20/11 12:15pm   Addendum:  Feeling much better after IV fluids, Zofran, and Immodium.   No nausea, no more episodes of diarrhea. Tolerated po fluids and crackers.  Filed Vitals:   11/20/11 0933 11/20/11 1438  BP: 117/67 95/52  Pulse: 102 93  Temp: 98.9 F (37.2 C) 98.6 F (37 C)  TempSrc: Oral   Resp: 16 18  Height: 5' 1.5" (1.562 m)    D/C'd home with instructions for fluids, rest, Immodium, Zofran prn. Rx Immodium 2 mg po prn diarrhea, Zofran 4 mg po Q 8 hours prn. Will reschedule her current NOB interview appointment as a NOB work-up and [place] with VL on 11/29/11. To call prn any issues or concerns.  Donnel Saxon, CNM, [place] 11/20/11 2:40pm

## 2011-11-20 NOTE — MAU Note (Signed)
Patient states she had sudden onset of nausea, vomiting and diarrhea last night. Multiple episodes, last vomiting about 0500. Mid to upper abdominal cramping.

## 2011-11-20 NOTE — Discharge Instructions (Signed)

## 2011-11-29 ENCOUNTER — Encounter: Payer: Medicaid Other | Admitting: Obstetrics and Gynecology

## 2011-12-11 ENCOUNTER — Other Ambulatory Visit: Payer: Medicaid Other

## 2011-12-11 ENCOUNTER — Encounter: Payer: Medicaid Other | Admitting: Registered Nurse

## 2011-12-13 ENCOUNTER — Encounter (INDEPENDENT_AMBULATORY_CARE_PROVIDER_SITE_OTHER): Payer: Medicaid Other

## 2011-12-13 ENCOUNTER — Encounter: Payer: Medicaid Other | Admitting: Registered Nurse

## 2011-12-13 ENCOUNTER — Encounter (INDEPENDENT_AMBULATORY_CARE_PROVIDER_SITE_OTHER): Payer: Medicaid Other | Admitting: Registered Nurse

## 2011-12-13 DIAGNOSIS — Z1389 Encounter for screening for other disorder: Secondary | ICD-10-CM

## 2011-12-13 DIAGNOSIS — O30009 Twin pregnancy, unspecified number of placenta and unspecified number of amniotic sacs, unspecified trimester: Secondary | ICD-10-CM

## 2011-12-13 DIAGNOSIS — Z331 Pregnant state, incidental: Secondary | ICD-10-CM

## 2011-12-13 LAB — OB RESULTS CONSOLE ABO/RH: RH Type: POSITIVE

## 2011-12-13 LAB — OB RESULTS CONSOLE HIV ANTIBODY (ROUTINE TESTING): HIV: NONREACTIVE

## 2011-12-13 LAB — OB RESULTS CONSOLE ANTIBODY SCREEN: Antibody Screen: NEGATIVE

## 2011-12-17 ENCOUNTER — Other Ambulatory Visit: Payer: Self-pay

## 2011-12-17 DIAGNOSIS — Z3689 Encounter for other specified antenatal screening: Secondary | ICD-10-CM

## 2011-12-20 ENCOUNTER — Other Ambulatory Visit: Payer: Medicaid Other

## 2011-12-20 ENCOUNTER — Ambulatory Visit: Payer: Medicaid Other

## 2011-12-20 ENCOUNTER — Encounter: Payer: Medicaid Other | Admitting: Registered Nurse

## 2011-12-26 ENCOUNTER — Encounter: Payer: Self-pay | Admitting: Obstetrics and Gynecology

## 2011-12-26 ENCOUNTER — Ambulatory Visit (INDEPENDENT_AMBULATORY_CARE_PROVIDER_SITE_OTHER): Payer: Medicaid Other | Admitting: Obstetrics and Gynecology

## 2011-12-26 ENCOUNTER — Other Ambulatory Visit: Payer: Self-pay | Admitting: Obstetrics and Gynecology

## 2011-12-26 ENCOUNTER — Ambulatory Visit (INDEPENDENT_AMBULATORY_CARE_PROVIDER_SITE_OTHER): Payer: Medicaid Other

## 2011-12-26 VITALS — BP 100/56 | Wt 161.0 lb

## 2011-12-26 DIAGNOSIS — Z331 Pregnant state, incidental: Secondary | ICD-10-CM

## 2011-12-26 DIAGNOSIS — O30009 Twin pregnancy, unspecified number of placenta and unspecified number of amniotic sacs, unspecified trimester: Secondary | ICD-10-CM

## 2011-12-26 DIAGNOSIS — Z1389 Encounter for screening for other disorder: Secondary | ICD-10-CM

## 2011-12-26 DIAGNOSIS — IMO0001 Reserved for inherently not codable concepts without codable children: Secondary | ICD-10-CM

## 2011-12-26 LAB — US OB FOLLOW UP

## 2011-12-26 MED ORDER — ZOLPIDEM TARTRATE 10 MG PO TABS: 10.0000 mg | ORAL_TABLET | Freq: Every evening | ORAL | Status: DC | PRN

## 2011-12-26 MED ORDER — CONCEPT DHA 53.5-38-1 MG PO CAPS: 1.0000 | ORAL_CAPSULE | Freq: Every day | ORAL | Status: DC

## 2011-12-26 NOTE — Progress Notes (Signed)
Erika David  F/u only today - has NOB interview and w/u sched  Twin gest [facility] 8/24 based on LMP  Baby A EDC 8/14 EFW 1lb4oz 56% on maternal left, vertex Anterior placenta grade 0 Normal fluid Female No anomalies seens Twin B - maternal right Breech Anterior placenta grade 0 Normal fluid Female No anomalies Incomplete heart views, no view of DA, AA  Concordant growth Di/Di  Cx=3.47cm  Repeat scan in 2 weeks for complete views

## 2011-12-31 ENCOUNTER — Encounter (HOSPITAL_COMMUNITY): Payer: Self-pay | Admitting: *Deleted

## 2011-12-31 ENCOUNTER — Telehealth: Payer: Self-pay | Admitting: Obstetrics and Gynecology

## 2011-12-31 ENCOUNTER — Other Ambulatory Visit: Payer: Self-pay

## 2011-12-31 ENCOUNTER — Inpatient Hospital Stay (HOSPITAL_COMMUNITY)
Admission: AD | Admit: 2011-12-31 | Discharge: 2011-12-31 | Disposition: A | Discharge status: Home / Self Care | Payer: Medicaid Other | Source: Ambulatory Visit | Attending: Obstetrics and Gynecology | Admitting: Obstetrics and Gynecology

## 2011-12-31 DIAGNOSIS — O30009 Twin pregnancy, unspecified number of placenta and unspecified number of amniotic sacs, unspecified trimester: Secondary | ICD-10-CM | POA: Insufficient documentation

## 2011-12-31 DIAGNOSIS — O26899 Other specified pregnancy related conditions, unspecified trimester: Secondary | ICD-10-CM

## 2011-12-31 DIAGNOSIS — O99891 Other specified diseases and conditions complicating pregnancy: Secondary | ICD-10-CM | POA: Insufficient documentation

## 2011-12-31 DIAGNOSIS — N949 Unspecified condition associated with female genital organs and menstrual cycle: Secondary | ICD-10-CM | POA: Insufficient documentation

## 2011-12-31 DIAGNOSIS — Z331 Pregnant state, incidental: Secondary | ICD-10-CM

## 2011-12-31 DIAGNOSIS — N898 Other specified noninflammatory disorders of vagina: Secondary | ICD-10-CM

## 2011-12-31 HISTORY — DX: Reserved for concepts with insufficient information to code with codable children: IMO0002

## 2011-12-31 HISTORY — DX: Unspecified abnormal cytological findings in specimens from cervix uteri: R87.619

## 2011-12-31 LAB — URINALYSIS, ROUTINE W REFLEX MICROSCOPIC
Bilirubin Urine: NEGATIVE
Glucose, UA: NEGATIVE mg/dL
Hgb urine dipstick: NEGATIVE
Ketones, ur: NEGATIVE mg/dL
Leukocytes, UA: NEGATIVE
Nitrite: NEGATIVE
Protein, ur: NEGATIVE mg/dL
Specific Gravity, Urine: <1.005 — ABNORMAL LOW (ref 1.005–1.030)
Urobilinogen, UA: 0.2 mg/dL (ref 0.0–1.0)
pH: 6 (ref 5.0–8.0)

## 2011-12-31 LAB — OB RESULTS CONSOLE HEPATITIS B SURFACE ANTIGEN: Hepatitis B Surface Ag: NEGATIVE

## 2011-12-31 LAB — WET PREP, GENITAL
Clue Cells Wet Prep HPF POC: NONE SEEN
Trich, Wet Prep: NONE SEEN
Yeast Wet Prep HPF POC: NONE SEEN

## 2011-12-31 LAB — OB RESULTS CONSOLE RUBELLA ANTIBODY, IGM: Rubella: IMMUNE

## 2011-12-31 LAB — AMNISURE RUPTURE OF MEMBRANE (ROM) NOT AT ARMC: Amnisure ROM: NEGATIVE

## 2011-12-31 NOTE — Progress Notes (Signed)
History     CSN: [id number]  Arrival date & time 12/31/11  1544   First Provider Initiated Contact with Patient 12/31/11 1656      Chief Complaint  Patient presents with  . vaginal leakage, watery   states leaking off and on since 0600, crampy to day like period 6-8 cramps per hour, no vag bleeding, had sex in the last 24 hours  HPI  Past Medical History  Diagnosis Date  . Chlamydia   . Abnormal Pap smear     Past Surgical History  Procedure Date  . Gynecologic cryosurgery   . Wisdom tooth extraction 2007    Family History  Problem Relation Age of Onset  . Anesthesia problems Neg Hx   . Hypertension Mother   . Cancer Mother   . Diabetes Father   . Hypertension Sister     History  Substance Use Topics  . Smoking status: Never Smoker   . Smokeless tobacco: Never Used  . Alcohol Use: No     occasional alcohol    OB History    Grav Para Term Preterm Abortions TAB SAB Ect Mult Living   6 3 3  2 1 1   3      6/01 Female SVD term 8/07 Female SVD term 2/11 Female SVD term SAB x 2 Review of Systems  Allergies  Review of patient's allergies indicates no known allergies.  Home Medications  No current outpatient prescriptions on file. PNV Zertec Nasal spray prn allergies BP 120/74  Pulse 102  Temp(Src) 98.6 F (37 C) (Oral)  Resp 18  Ht 5' 1"  (1.549 m)  Wt 162 lb (73.483 kg)  BMI 30.61 kg/m2  LMP 07/28/2011  Physical Exam Abd soft, gravid, nt SSE cervix pink moist thick discharge neg pool, digital exam cervix LTC FHTs 140 and 150 toco slilght irritability  MAU Course  Procedures (including critical care time)   Labs Reviewed  AMNISURE RUPTURE OF MEMBRANE (ROM)  GC/CHLAMYDIA PROBE AMP, GENITAL  WET PREP, GENITAL  URINALYSIS, ROUTINE W REFLEX MICROSCOPIC   No results found.   No diagnosis found.    MDM  23 4/7 week IUP Twin gestation Di/Di P amnisure, wet prep, ua, gc/chl pending, po fluids now. Hetty Ely, CNM  ADDENDUM: neg  amnisure, neg wet prep, neg ua, discharge home f/o office Fri as schedules, s/s PTL to report. Collaboration with Dr. Cletis Media per telephone. Hetty Ely, CNM

## 2011-12-31 NOTE — MAU Note (Signed)
Feels like having fluid leakage, having some cramping-noted since 0600.  TWINS

## 2011-12-31 NOTE — Telephone Encounter (Signed)
TC from pt.   States at [facility] after urinating, felt"wet."  None since then until a few minutes ago.  Having clear watery vag d/c, enough to need a pad.  +cramping.  +FM.  States is having twins.  Per [place] pt advised to go to MAU.  Pt verbalizes comprehension.

## 2011-12-31 NOTE — Discharge Instructions (Signed)
Preterm Labor Preterm labor is when labor starts at less than 37 weeks of pregnancy. The normal length of a pregnancy is 39 to 41 weeks. CAUSES Often, there is no identifiable underlying cause as to why a woman goes into preterm labor. However, one of the most common known causes of preterm labor is infection. Infections of the uterus, cervix, vagina, amniotic sac, bladder, kidney, or even the lungs (pneumonia) can cause labor to start. Other causes of preterm labor include:  Urogenital infections, such as yeast infections and bacterial vaginosis.   Uterine abnormalities (uterine shape, uterine septum, fibroids, bleeding from the placenta).   A cervix that has been operated on and opens prematurely.   Malformations in the baby.   Multiple gestations (twins, triplets, and so on).   Breakage of the amniotic sac.  Additional risk factors for preterm labor include:  Previous history of preterm labor.  ABCs of Pregnancy A Antepartum care is very important. Be sure you see your doctor and get prenatal care as soon as you think you are pregnant. At this time, you will be tested for infection, genetic abnormalities and potential problems with you and the pregnancy. This is the time to discuss diet, exercise, work, medications, labor, pain medication during labor and the possibility of a cesarean delivery. Ask any questions that may concern you. It is important to see your doctor regularly throughout your pregnancy. Avoid exposure to toxic substances and chemicals - such as cleaning solvents, lead and mercury, some insecticides, and paint. Pregnant women should avoid exposure to paint fumes, and fumes that cause you to feel ill, dizzy or faint. When possible, it is a good idea to have a pre-pregnancy consultation with your caregiver to begin some important recommendations your caregiver suggests such as, taking folic acid, exercising, quitting smoking, avoiding alcoholic beverages, etc. B Breastfeeding  is the healthiest choice for both you and your baby. It has many nutritional benefits for the baby and health benefits for the mother. It also creates a very tight and loving bond between the baby and mother. Talk to your doctor, your family and friends, and your employer about how you choose to feed your baby and how they can support you in your decision. Not all birth defects can be prevented, but a woman can take actions that may increase her chance of having a healthy baby. Many birth defects happen very early in pregnancy, sometimes before a woman even knows she is pregnant. Birth defects or abnormalities of any child in your or the father's family should be discussed with your caregiver. Get a good support bra as your breast size changes. Wear it especially when you exercise and when nursing.  C Celebrate the news of your pregnancy with the your spouse/father and family. Childbirth classes are helpful to take for you and the spouse/father because it helps to understand what happens during the pregnancy, labor and delivery. Cesarean delivery should be discussed with your doctor so you are prepared for that possibility. The pros and cons of circumcision if it is a boy, should be discussed with your pediatrician. Cigarette smoking during pregnancy can result in low birth weight babies. It has been associated with infertility, miscarriages, tubal pregnancies, infant death (mortality) and poor health (morbidity) in childhood. Additionally, cigarette smoking may cause long-term learning disabilities. If you smoke, you should try to quit before getting pregnant and not smoke during the pregnancy. Secondary smoke may also harm a mother and her developing baby. It is a good idea  to ask people to stop smoking around you during your pregnancy and after the baby is born. Extra calcium is necessary when you are pregnant and is found in your prenatal vitamin, in dairy products, green leafy vegetables and in calcium  supplements. D A healthy diet according to your current weight and height, along with vitamins and mineral supplements should be discussed with your caregiver. Domestic abuse or violence should be made known to your doctor right away to get the situation corrected. Drink more water when you exercise to keep hydrated. Discomfort of your back and legs usually develops and progresses from the middle of the second trimester through to delivery of the baby. This is because of the enlarging baby and uterus, which may also affect your balance. Do not take illegal drugs. Illegal drugs can seriously harm the baby and you. Drink extra fluids (water is best) throughout pregnancy to help your body keep up with the increases in your blood volume. Drink at least 6 to 8 glasses of water, fruit juice, or milk each day. A good way to know you are drinking enough fluid is when your urine looks almost like clear water or is very light yellow.  E Eat healthy to get the nutrients you and your unborn baby need. Your meals should include the five basic food groups. Exercise (30 minutes of light to moderate exercise a day) is important and encouraged during pregnancy, if there are no medical problems or problems with the pregnancy. Exercise that causes discomfort or dizziness should be stopped and reported to your caregiver. Emotions during pregnancy can change from being ecstatic to depression and should be understood by you, your partner and your family. F Fetal screening with ultrasound, amniocentesis and monitoring during pregnancy and labor is common and sometimes necessary. Take 400 micrograms of folic acid daily both before, when possible, and during the first few months of pregnancy to reduce the risk of birth defects of the brain and spine. All women who could possibly become pregnant should take a vitamin with folic acid, every day. It is also important to eat a healthy diet with fortified foods (enriched grain products,  including cereals, rice, breads, and pastas) and foods with natural sources of folate (orange juice, green leafy vegetables, beans, peanuts, broccoli, asparagus, peas, and lentils). The father should be involved with all aspects of the pregnancy including, the prenatal care, childbirth classes, labor, delivery, and postpartum time. Fathers may also have emotional concerns about being a father, financial needs, and raising a family. G Genetic testing should be done appropriately. It is important to know your family and the father's history. If there have been problems with pregnancies or birth defects in your family, report these to your doctor. Also, genetic counselors can talk with you about the information you might need in making decisions about having a family. You can call a major medical center in your area for help in finding a board-certified genetic counselor. Genetic testing and counseling should be done before pregnancy when possible, especially if there is a history of problems in the mother's or father's family. Certain ethnic backgrounds are more at risk for genetic defects. H Get familiar with the hospital where you will be having your baby. Get to know how long it takes to get there, the labor and delivery area, and the hospital procedures. Be sure your medical insurance is accepted there. Get your home ready for the baby including, clothes, the baby's room (when possible), furniture and car seat.  Hand washing is important throughout the day, especially after handling raw meat and poultry, changing the baby's diaper or using the bathroom. This can help prevent the spread of many bacteria and viruses that cause infection. Your hair may become dry and thinner, but will return to normal a few weeks after the baby is born. Heartburn is a common problem that can be treated by taking antacids recommended by your caregiver, eating smaller meals 5 or 6 times a day, not drinking liquids when eating,  drinking between meals and raising the head of your bed 2 to 3 inches. I Insurance to cover you, the baby, doctor and hospital should be reviewed so that you will be prepared to pay any costs not covered by your insurance plan. If you do not have medical insurance, there are usually clinics and services available for you in your community. Take 30 milligrams of iron during your pregnancy as prescribed by your doctor to reduce the risk of low red blood cells (anemia) later in pregnancy. All women of childbearing age should eat a diet rich in iron. J There should be a joint effort for the mother, father and any other children to adapt to the pregnancy financially, emotionally, and psychologically during the pregnancy. Join a support group for moms-to-be. Or, join a class on parenting or childbirth. Have the family participate when possible. K Know your limits. Let your caregiver know if you experience any of the following:  Pain of any kind.  Strong cramps.  You develop a lot of weight in a short period of time (5 pounds in 3 to 5 days).  Vaginal bleeding, leaking of amniotic fluid.  Headache, vision problems.  Dizziness, fainting, shortness of breath.  Chest pain.  Fever of 102 F (38.9 C) or higher.  Gush of clear fluid from your vagina.  Painful urination.  Domestic violence.  Irregular heartbeat (palpitations).  Rapid beating of the heart (tachycardia).  Constant feeling sick to your stomach (nauseous) and vomiting.  Trouble walking, fluid retention (edema).  Muscle weakness.  If your baby has decreased activity.  Persistent diarrhea.  Abnormal vaginal discharge.  Uterine contractions at 20-minute intervals.  Back pain that travels down your leg.  L Learn and practice that what you eat and drink should be in moderation and healthy for you and your baby. Legal drugs such as alcohol and caffeine are important issues for pregnant women. There is no safe amount of alcohol a woman can  drink while pregnant. Fetal alcohol syndrome, a disorder characterized by growth retardation, facial abnormalities, and central nervous system dysfunction, is caused by a woman's use of alcohol during pregnancy. Caffeine, found in tea, coffee, soft drinks and chocolate, should also be limited. Be sure to read labels when trying to cut down on caffeine during pregnancy. More than 200 foods, beverages, and over-the-counter medications contain caffeine and have a high salt content! There are coffees and teas that do not contain caffeine. M Medical conditions such as diabetes, epilepsy, and high blood pressure should be treated and kept under control before pregnancy when possible, but especially during pregnancy. Ask your caregiver about any medications that may need to be changed or adjusted during pregnancy. If you are currently taking any medications, ask your caregiver if it is safe to take them while you are pregnant or before getting pregnant when possible. Also, be sure to discuss any herbs or vitamins you are taking. They are medicines, too! Discuss with your doctor all medications, prescribed and over-the-counter,  that you are taking. During your prenatal visit, discuss the medications your doctor may give you during labor and delivery. N Never be afraid to ask your doctor or caregiver questions about your health, the progress of the pregnancy, family problems, stressful situations, and recommendation for a pediatrician, if you do not have one. It is better to take all precautions and discuss any questions or concerns you may have during your office visits. It is a good idea to write down your questions before you visit the doctor. O Over-the-counter cough and cold remedies may contain alcohol or other ingredients that should be avoided during pregnancy. Ask your caregiver about prescription, herbs or over-the-counter medications that you are taking or may consider taking while pregnant.  P Physical  activity during pregnancy can benefit both you and your baby by lessening discomfort and fatigue, providing a sense of well-being, and increasing the likelihood of early recovery after delivery. Light to moderate exercise during pregnancy strengthens the belly (abdominal) and back muscles. This helps improve posture. Practicing yoga, walking, swimming, and cycling on a stationary bicycle are usually safe exercises for pregnant women. Avoid scuba diving, exercise at high altitudes (over 3000 feet), skiing, horseback riding, contact sports, etc. Always check with your doctor before beginning any kind of exercise, especially during pregnancy and especially if you did not exercise before getting pregnant. Q Queasiness, stomach upset and morning sickness are common during pregnancy. Eating a couple of crackers or dry toast before getting out of bed. Foods that you normally love may make you feel sick to your stomach. You may need to substitute other nutritious foods. Eating 5 or 6 small meals a day instead of 3 large ones may make you feel better. Do not drink with your meals, drink between meals. Questions that you have should be written down and asked during your prenatal visits. R Read about and make plans to baby-proof your home. There are important tips for making your home a safer environment for your baby. Review the tips and make your home safer for you and your baby. Read food labels regarding calories, salt and fat content in the food. S Saunas, hot tubs, and steam rooms should be avoided while you are pregnant. Excessive high heat may be harmful during your pregnancy. Your caregiver will screen and examine you for sexually transmitted diseases and genetic disorders during your prenatal visits. Learn the signs of labor. Sexual relations while pregnant is safe unless there is a medical or pregnancy problem and your caregiver advises against it. T Traveling long distances should be avoided especially in  the third trimester of your pregnancy. If you do have to travel out of state, be sure to take a copy of your medical records and medical insurance plan with you. You should not travel long distances without seeing your doctor first. Most airlines will not allow you to travel after 36 weeks of pregnancy. Toxoplasmosis is an infection caused by a parasite that can seriously harm an unborn baby. Avoid eating undercooked meat and handling cat litter. Be sure to wear gloves when gardening. Tingling of the hands and fingers is not unusual and is due to fluid retention. This will go away after the baby is born. U Womb (uterus) size increases during the first trimester. Your kidneys will begin to function more efficiently. This may cause you to feel the need to urinate more often. You may also leak urine when sneezing, coughing or laughing. This is due to the growing uterus pressing  against your bladder, which lies directly in front of and slightly under the uterus during the first few months of pregnancy. If you experience burning along with frequency of urination or bloody urine, be sure to tell your doctor. The size of your uterus in the third trimester may cause a problem with your balance. It is advisable to maintain good posture and avoid wearing high heels during this time. An ultrasound of your baby may be necessary during your pregnancy and is safe for you and your baby. V Vaccinations are an important concern for pregnant women. Get needed vaccines before pregnancy. Center for Disease Control (http://www.wolf.info/) has clear guidelines for the use of vaccines during pregnancy. Review the list, be sure to discuss it with your doctor. Prenatal vitamins are helpful and healthy for you and the baby. Do not take extra vitamins except what is recommended. Taking too much of certain vitamins can cause overdose problems. Continuous vomiting should be reported to your caregiver. Varicose veins may appear especially if there is  a family history of varicose veins. They should subside after the delivery of the baby. Support hose helps if there is leg discomfort. W Being overweight or underweight during pregnancy may cause problems. Try to get within 15 pounds of your ideal weight before pregnancy. Remember, pregnancy is not a time to be dieting! Do not stop eating or start skipping meals as your weight increases. Both you and your baby need the calories and nutrition you receive from a healthy diet. Be sure to consult with your doctor about your diet. There is a formula and diet plan available depending on whether you are overweight or underweight. Your caregiver or nutritionist can help and advise you if necessary. X Avoid X-rays. If you must have dental work or diagnostic tests, tell your dentist or physician that you are pregnant so that extra care can be taken. X-rays should only be taken when the risks of not taking them outweigh the risk of taking them. If needed, only the minimum amount of radiation should be used. When X-rays are necessary, protective lead shields should be used to cover areas of the body that are not being X-rayed. Y Your baby loves you. Breastfeeding your baby creates a loving and very close bond between the two of you. Give your baby a healthy environment to live in while you are pregnant. Infants and children require constant care and guidance. Their health and safety should be carefully watched at all times. After the baby is born, rest or take a nap when the baby is sleeping. Z Get your ZZZs. Be sure to get plenty of rest. Resting on your side as often as possible, especially on your left side is advised. It provides the best circulation to your baby and helps reduce swelling. Try taking a nap for 30 to 45 minutes in the afternoon when possible. After the baby is born rest or take a nap when the baby is sleeping. Try elevating your feet for that amount of time when possible. It helps the circulation in  your legs and helps reduce swelling.  Most information courtesy of the CDC. Document Released: 08/27/2005 Document Revised: 08/16/2011 Document Reviewed: 05/11/2009 [facility] Patient Information 2012 [place].  Premature rupture of membranes (PROM).   A placenta that covers the opening of the cervix (placenta previa).   A placenta that separates from the uterus (placenta abruption).   A cervix that is too weak to hold the baby in the uterus (incompetence cervix).  Having too much fluid in the amniotic sac (polyhydramnios).   Taking illegal drugs or smoking while pregnant.   Not gaining enough weight while pregnant.   Women younger than 51 and older than 34 years old.   Low socioeconomic status.   African-American ethnicity.  SYMPTOMS Signs and symptoms of preterm labor include:  Menstrual-like cramps.   Contractions that are 30 to 70 seconds apart, become very regular, closer together, and are more intense and painful.   Contractions that start on the top of the uterus and spread down to the lower abdomen and back.   A sense of increased pelvic pressure or back pain.   A watery or bloody discharge that comes from the vagina.  DIAGNOSIS  A diagnosis can be confirmed by:  A vaginal exam.   An ultrasound of the cervix.   Sampling (swabbing) cervico-vaginal secretions. These samples can be tested for the presence of fetal fibronectin. This is a protein found in cervical discharge which is associated with preterm labor.   Fetal monitoring.  TREATMENT  Depending on the length of the pregnancy and other circumstances, a caregiver may suggest bed rest. If necessary, there are medicines that can be given to stop contractions and to quicken fetal lung maturity. If labor happens before 34 weeks of pregnancy, a prolonged hospital stay may be recommended. Treatment depends on the condition of both the mother and baby. PREVENTION There are some things a mother can do to  lower the risk of preterm labor in future pregnancies. A woman can:   Stop smoking.   Maintain healthy weight gain and avoid chemicals and drugs that are not necessary.   Be watchful for any type of infection.   Inform her caregiver if she has a known history of preterm labor.  Document Released: 11/17/2003 Document Revised: 08/16/2011 Document Reviewed: 12/22/2010 [facility] Patient Information 2012 [place].

## 2012-01-01 ENCOUNTER — Encounter: Payer: Medicaid Other | Admitting: Obstetrics and Gynecology

## 2012-01-01 LAB — GC/CHLAMYDIA PROBE AMP, GENITAL
Chlamydia, DNA Probe: NEGATIVE
GC Probe Amp, Genital: NEGATIVE

## 2012-01-04 ENCOUNTER — Encounter: Payer: Self-pay | Admitting: Obstetrics and Gynecology

## 2012-01-04 ENCOUNTER — Ambulatory Visit: Payer: Medicaid Other

## 2012-01-04 ENCOUNTER — Ambulatory Visit (INDEPENDENT_AMBULATORY_CARE_PROVIDER_SITE_OTHER): Payer: Medicaid Other | Admitting: Obstetrics and Gynecology

## 2012-01-04 VITALS — BP 100/60 | Ht 61.0 in | Wt 161.0 lb

## 2012-01-04 DIAGNOSIS — O30049 Twin pregnancy, dichorionic/diamniotic, unspecified trimester: Secondary | ICD-10-CM

## 2012-01-04 DIAGNOSIS — Z302 Encounter for sterilization: Secondary | ICD-10-CM

## 2012-01-04 DIAGNOSIS — Z124 Encounter for screening for malignant neoplasm of cervix: Secondary | ICD-10-CM

## 2012-01-04 DIAGNOSIS — D649 Anemia, unspecified: Secondary | ICD-10-CM

## 2012-01-04 DIAGNOSIS — D500 Iron deficiency anemia secondary to blood loss (chronic): Secondary | ICD-10-CM | POA: Insufficient documentation

## 2012-01-04 DIAGNOSIS — IMO0002 Reserved for concepts with insufficient information to code with codable children: Secondary | ICD-10-CM

## 2012-01-04 DIAGNOSIS — J309 Allergic rhinitis, unspecified: Secondary | ICD-10-CM

## 2012-01-04 DIAGNOSIS — E669 Obesity, unspecified: Secondary | ICD-10-CM | POA: Insufficient documentation

## 2012-01-04 DIAGNOSIS — O0990 Supervision of high risk pregnancy, unspecified, unspecified trimester: Secondary | ICD-10-CM

## 2012-01-04 DIAGNOSIS — Z331 Pregnant state, incidental: Secondary | ICD-10-CM

## 2012-01-04 DIAGNOSIS — G4700 Insomnia, unspecified: Secondary | ICD-10-CM

## 2012-01-04 DIAGNOSIS — E663 Overweight: Secondary | ICD-10-CM

## 2012-01-04 DIAGNOSIS — J302 Other seasonal allergic rhinitis: Secondary | ICD-10-CM

## 2012-01-04 HISTORY — DX: Anemia, unspecified: D64.9

## 2012-01-04 HISTORY — DX: Encounter for sterilization: Z30.2

## 2012-01-04 LAB — POCT URINALYSIS DIPSTICK
Bilirubin, UA: NEGATIVE
Blood, UA: NEGATIVE
Glucose, UA: NEGATIVE
Ketones, UA: NEGATIVE
Nitrite, UA: NEGATIVE
Spec Grav, UA: 1.01
Urobilinogen, UA: NEGATIVE
pH, UA: 8

## 2012-01-04 NOTE — Progress Notes (Signed)
Last Pap was "2010" "WNL" Pt had flu shot in November. Pt states she has no concerns today. Pt was at the hospital 12/31/10 GC/CT was done by Garvin Fila. CNM. Pt has concerns about getting induced. Pt c/o hip pain and low back pain, itching eyes has questions about medicated eye drops.

## 2012-01-04 NOTE — Progress Notes (Signed)
Patient presents with a twin gestation.  She reports that her hips are hurting her.  She had the same symptoms with her last pregnancy.  We will sign tubal papers today because she does not want to have anymore children.A form was completed so that the patient can get a handicap parking sticker.  Return to office in 1 weeks.   NOB EXAM:  HEENT:  Within normal limits.  No masses or thyromegaly.  Chest:  Clear  Heart:  Regular rate and rhythm  Breasts:  No masses or skin changes.  Abdomen:  Gravid, fundal height measures 28-29 cm, fetal heart tones are audible, nontender  Extremities:  No edema, cords, or masses  Neurologic exam:  Grossly normal  Pelvic exam:  External genitalia: Normal Vagina: Cystocele present Cervix: Fingertip dilated, 20% effaced, the presenting part is ballotable Uterus: Gravid, nontender Adnexa: Not palpable  Assessment:  See problem list  Plan:  Return to office in 1 week. Ultrasound scheduled at that point. Repeat growth ultrasounds every 3-4 weeks. Glucola screen at [redacted] weeks gestation. The patient will begin antenatal testing at 32 weeks because of a marginal insertion of the umbilical cord and because of the twin gestation. Medicaid tubal papers were signed today because the patient desires sterilization.  Dr. Raphael Gibney

## 2012-01-04 NOTE — Progress Notes (Unsigned)
Had dental X-ray with shield and tooth extraction 01/01/12.

## 2012-01-06 LAB — CULTURE, OB URINE: Colony Count: 7000

## 2012-01-07 LAB — VARICELLA ZOSTER ANTIBODY, IGG: Varicella IgG: 3.16 {ISR} — ABNORMAL HIGH

## 2012-01-08 LAB — PAP IG W/ RFLX HPV ASCU

## 2012-01-08 LAB — VARICELLA ZOSTER ANTIBODY, IGM: Varicella Zoster Ab IgM: 0.18 (ref <0.91)

## 2012-01-10 ENCOUNTER — Other Ambulatory Visit: Payer: Self-pay | Admitting: Obstetrics and Gynecology

## 2012-01-10 ENCOUNTER — Ambulatory Visit (INDEPENDENT_AMBULATORY_CARE_PROVIDER_SITE_OTHER): Payer: Medicaid Other

## 2012-01-10 ENCOUNTER — Encounter: Payer: Self-pay | Admitting: Obstetrics and Gynecology

## 2012-01-10 ENCOUNTER — Ambulatory Visit (INDEPENDENT_AMBULATORY_CARE_PROVIDER_SITE_OTHER): Payer: Medicaid Other | Admitting: Obstetrics and Gynecology

## 2012-01-10 VITALS — BP 114/68 | Wt 162.0 lb

## 2012-01-10 DIAGNOSIS — Z3480 Encounter for supervision of other normal pregnancy, unspecified trimester: Secondary | ICD-10-CM

## 2012-01-10 DIAGNOSIS — O30009 Twin pregnancy, unspecified number of placenta and unspecified number of amniotic sacs, unspecified trimester: Secondary | ICD-10-CM

## 2012-01-10 DIAGNOSIS — Z331 Pregnant state, incidental: Secondary | ICD-10-CM

## 2012-01-10 LAB — US OB FOLLOW UP

## 2012-01-10 NOTE — Progress Notes (Signed)
Pt unable to provide urine sample.  Will collect before leaving.

## 2012-01-10 NOTE — Progress Notes (Signed)
Patient ID: Erika David, female   DOB: Mar 25, 1976, 36 y.o.   MRN: [id number]  [place] twins growth WNLanterior placenta, AFI WNL, anatomy WNL x 2 Plans BTL consent now Reviewed s/s preterm labor, srom, vag bleeding, kick counts to report, enc 8 water daily and frequent  Has referral for PT hip F/o 2 weeks 1 gt rpr, hgb Hetty Ely, CNM

## 2012-01-18 ENCOUNTER — Telehealth: Payer: Self-pay | Admitting: Obstetrics and Gynecology

## 2012-01-18 NOTE — Telephone Encounter (Signed)
Tc to [facility] per telephone call. Spoke with Amanda(nurse). Pt was seen in their office today and dx with a sinus infection. Pt pres Ceftin and Singulair and wants to know if safe during preg. Told caller both meds safe to take per nd. Caller voices understanding.

## 2012-01-18 NOTE — Telephone Encounter (Signed)
TRIAGE/EPIC

## 2012-01-21 ENCOUNTER — Ambulatory Visit (INDEPENDENT_AMBULATORY_CARE_PROVIDER_SITE_OTHER): Payer: Medicaid Other | Admitting: Obstetrics and Gynecology

## 2012-01-21 VITALS — BP 110/60 | Wt 163.0 lb

## 2012-01-21 DIAGNOSIS — Z331 Pregnant state, incidental: Secondary | ICD-10-CM

## 2012-01-21 LAB — HEMOGLOBIN: Hemoglobin: 10.8 g/dL — ABNORMAL LOW (ref 12.0–15.0)

## 2012-01-21 LAB — GLUCOSE TOLERANCE, 1 HOUR: Glucose, 1 Hour GTT: 113 mg/dL (ref 70–140)

## 2012-01-21 NOTE — Progress Notes (Signed)
1 gtt given today without difficulty  PT appt scheduled on 5/16 @ 2 pm per pt

## 2012-01-21 NOTE — Progress Notes (Signed)
Patient ID: Erika David, female   DOB: [birth year], 36 y.o.   MRN: [id number] F/o growth twin gestation Reviewed s/s preterm labor, srom, vag bleeding, kick counts to report, enc 8 water daily and frequent voids

## 2012-01-22 LAB — RPR

## 2012-01-24 ENCOUNTER — Ambulatory Visit: Payer: Medicaid Other | Attending: Obstetrics and Gynecology | Admitting: Physical Therapy

## 2012-01-24 DIAGNOSIS — M25559 Pain in unspecified hip: Secondary | ICD-10-CM | POA: Insufficient documentation

## 2012-01-24 DIAGNOSIS — IMO0001 Reserved for inherently not codable concepts without codable children: Secondary | ICD-10-CM | POA: Insufficient documentation

## 2012-02-01 ENCOUNTER — Encounter: Payer: Self-pay | Admitting: Obstetrics and Gynecology

## 2012-02-01 ENCOUNTER — Encounter: Payer: Medicaid Other | Admitting: Obstetrics and Gynecology

## 2012-02-01 ENCOUNTER — Ambulatory Visit (INDEPENDENT_AMBULATORY_CARE_PROVIDER_SITE_OTHER): Payer: Medicaid Other

## 2012-02-01 ENCOUNTER — Ambulatory Visit (INDEPENDENT_AMBULATORY_CARE_PROVIDER_SITE_OTHER): Payer: Medicaid Other | Admitting: Obstetrics and Gynecology

## 2012-02-01 ENCOUNTER — Other Ambulatory Visit: Payer: Medicaid Other

## 2012-02-01 ENCOUNTER — Other Ambulatory Visit: Payer: Self-pay | Admitting: Obstetrics and Gynecology

## 2012-02-01 VITALS — BP 128/62 | Wt 163.0 lb

## 2012-02-01 DIAGNOSIS — Z331 Pregnant state, incidental: Secondary | ICD-10-CM

## 2012-02-01 DIAGNOSIS — O30049 Twin pregnancy, dichorionic/diamniotic, unspecified trimester: Secondary | ICD-10-CM

## 2012-02-01 DIAGNOSIS — Z302 Encounter for sterilization: Secondary | ICD-10-CM

## 2012-02-01 LAB — US OB FOLLOW UP

## 2012-02-01 NOTE — Progress Notes (Signed)
Di-Di Twin pregnancy Ultrasound: A - 2 lbs 11 oz   44%   Vertex   AFI normal   Marginal cord insertion         B - 2 lbs 11 oz   43%   Vertex   AFI normal         Cervix= 3.59 cm 1 hour Glucola=113  Hgb 10.8

## 2012-02-07 ENCOUNTER — Ambulatory Visit: Payer: Medicaid Other | Admitting: Physical Therapy

## 2012-02-13 ENCOUNTER — Telehealth: Payer: Self-pay

## 2012-02-13 ENCOUNTER — Telehealth: Payer: Self-pay | Admitting: Obstetrics and Gynecology

## 2012-02-13 NOTE — Telephone Encounter (Signed)
Pt did not call back, so I called to check on her. She has not vomitted anymore, but still feels somewhat nauseous. She has tried  to eat some crackers. I recommended that she take the medication as directed for nausea and try to introduce BRAT diet until her appt. Tomorrow with Dr. Mancel Bale. She agreed to call me if sx's worsen today. Erika David

## 2012-02-13 NOTE — Telephone Encounter (Signed)
Pt called this am stating that she has vomitted x 4 this am. She is somewhat dizzy and light-headed. She feels like she is probably all done vomitting, but still has a little crampiness. Pt denies ctx, lof, or bleeding. Pt reports good FM.  Pt states she has some anti-nausea meds from earlier in pregnancy, so I rec. That she take that, along with Tylenol and lay down and rest for an hr or two and then report back to me re: sx's. And we'll go from there. I gave her my direct ext. Pt is agreeable and voices understanding. Carloyn Manner A

## 2012-02-14 ENCOUNTER — Encounter: Payer: Medicaid Other | Admitting: Physical Therapy

## 2012-02-15 ENCOUNTER — Ambulatory Visit (INDEPENDENT_AMBULATORY_CARE_PROVIDER_SITE_OTHER): Payer: Medicaid Other | Admitting: Obstetrics and Gynecology

## 2012-02-15 VITALS — BP 110/62 | Wt 165.0 lb

## 2012-02-15 DIAGNOSIS — O30049 Twin pregnancy, dichorionic/diamniotic, unspecified trimester: Secondary | ICD-10-CM

## 2012-02-15 NOTE — Progress Notes (Signed)
GCT 113, Hgb 10.8, RPR NR-reviewed Di-Di twins Occas ctx but not 4/hr No urinary sxs questions answered RTO 2wks for EFW x 2

## 2012-02-21 ENCOUNTER — Encounter: Payer: Medicaid Other | Admitting: Physical Therapy

## 2012-02-26 ENCOUNTER — Encounter: Payer: Self-pay | Admitting: Obstetrics and Gynecology

## 2012-02-26 ENCOUNTER — Ambulatory Visit (INDEPENDENT_AMBULATORY_CARE_PROVIDER_SITE_OTHER): Payer: Medicaid Other | Admitting: Obstetrics and Gynecology

## 2012-02-26 ENCOUNTER — Other Ambulatory Visit: Payer: Self-pay | Admitting: Obstetrics and Gynecology

## 2012-02-26 ENCOUNTER — Ambulatory Visit (INDEPENDENT_AMBULATORY_CARE_PROVIDER_SITE_OTHER): Payer: Medicaid Other

## 2012-02-26 VITALS — BP 118/68 | Wt 166.0 lb

## 2012-02-26 DIAGNOSIS — Z331 Pregnant state, incidental: Secondary | ICD-10-CM

## 2012-02-26 DIAGNOSIS — O30049 Twin pregnancy, dichorionic/diamniotic, unspecified trimester: Secondary | ICD-10-CM

## 2012-02-26 NOTE — Progress Notes (Signed)
Pt wants cx check today

## 2012-02-26 NOTE — Progress Notes (Signed)
Patient ID: Erika David, female   DOB: 1976/02/09, 36 y.o.   MRN: [id number] Twin a VTX anterior placenta AFI WNL EFW 46% Twin B VTX anterior placenta AFI WNL EFW 52%  Reviewed s/s preterm labor, srom, vag bleeding, kick counts to report, enc 8 water daily and frequent voids Hetty Ely, CNM

## 2012-02-28 ENCOUNTER — Encounter: Payer: Medicaid Other | Admitting: Physical Therapy

## 2012-03-07 ENCOUNTER — Encounter: Payer: Self-pay | Admitting: Obstetrics and Gynecology

## 2012-03-10 ENCOUNTER — Encounter: Payer: Self-pay | Admitting: Obstetrics and Gynecology

## 2012-03-10 ENCOUNTER — Ambulatory Visit (INDEPENDENT_AMBULATORY_CARE_PROVIDER_SITE_OTHER): Payer: Medicaid Other | Admitting: Obstetrics and Gynecology

## 2012-03-10 VITALS — BP 108/58 | Wt 163.0 lb

## 2012-03-10 DIAGNOSIS — O30009 Twin pregnancy, unspecified number of placenta and unspecified number of amniotic sacs, unspecified trimester: Secondary | ICD-10-CM

## 2012-03-10 DIAGNOSIS — O09899 Supervision of other high risk pregnancies, unspecified trimester: Secondary | ICD-10-CM

## 2012-03-10 DIAGNOSIS — IMO0001 Reserved for inherently not codable concepts without codable children: Secondary | ICD-10-CM

## 2012-03-10 NOTE — Progress Notes (Signed)
READY! Desires to discuss induction date Wants cervix checked

## 2012-03-10 NOTE — Progress Notes (Signed)
Ultrasound twins next visit. Return to office in 1 week. Doing well. Dr. Raphael Gibney

## 2012-03-11 ENCOUNTER — Observation Stay (HOSPITAL_COMMUNITY)
Admission: AD | Admit: 2012-03-11 | Discharge: 2012-03-12 | DRG: 778 | Disposition: A | Discharge status: Home / Self Care | Payer: Medicaid Other | Source: Ambulatory Visit | Attending: Obstetrics and Gynecology | Admitting: Obstetrics and Gynecology

## 2012-03-11 DIAGNOSIS — O30009 Twin pregnancy, unspecified number of placenta and unspecified number of amniotic sacs, unspecified trimester: Secondary | ICD-10-CM

## 2012-03-11 DIAGNOSIS — O479 False labor, unspecified: Secondary | ICD-10-CM

## 2012-03-11 DIAGNOSIS — O30049 Twin pregnancy, dichorionic/diamniotic, unspecified trimester: Secondary | ICD-10-CM

## 2012-03-11 DIAGNOSIS — O4700 False labor before 37 completed weeks of gestation, unspecified trimester: Secondary | ICD-10-CM

## 2012-03-12 ENCOUNTER — Encounter (HOSPITAL_COMMUNITY): Payer: Self-pay | Admitting: *Deleted

## 2012-03-12 DIAGNOSIS — O0930 Supervision of pregnancy with insufficient antenatal care, unspecified trimester: Secondary | ICD-10-CM | POA: Insufficient documentation

## 2012-03-12 DIAGNOSIS — O4700 False labor before 37 completed weeks of gestation, unspecified trimester: Secondary | ICD-10-CM

## 2012-03-12 DIAGNOSIS — O30009 Twin pregnancy, unspecified number of placenta and unspecified number of amniotic sacs, unspecified trimester: Secondary | ICD-10-CM

## 2012-03-12 DIAGNOSIS — O479 False labor, unspecified: Secondary | ICD-10-CM | POA: Diagnosis present

## 2012-03-12 LAB — WET PREP, GENITAL
Trich, Wet Prep: NONE SEEN
Yeast Wet Prep HPF POC: NONE SEEN

## 2012-03-12 LAB — TYPE AND SCREEN
ABO/RH(D): B POS
Antibody Screen: NEGATIVE

## 2012-03-12 LAB — URINALYSIS, ROUTINE W REFLEX MICROSCOPIC
Bilirubin Urine: NEGATIVE
Glucose, UA: NEGATIVE mg/dL
Hgb urine dipstick: NEGATIVE
Ketones, ur: NEGATIVE mg/dL
Leukocytes, UA: NEGATIVE
Nitrite: NEGATIVE
Protein, ur: NEGATIVE mg/dL
Specific Gravity, Urine: <1.005 — ABNORMAL LOW (ref 1.005–1.030)
Urobilinogen, UA: 0.2 mg/dL (ref 0.0–1.0)
pH: 5.5 (ref 5.0–8.0)

## 2012-03-12 LAB — CBC
HCT: 33 % — ABNORMAL LOW (ref 36.0–46.0)
Hemoglobin: 11.2 g/dL — ABNORMAL LOW (ref 12.0–15.0)
MCH: 29.2 pg (ref 26.0–34.0)
MCHC: 33.9 g/dL (ref 30.0–36.0)
MCV: 86.2 fL (ref 78.0–100.0)
Platelets: 205 10*3/uL (ref 150–400)
RBC: 3.83 MIL/uL — ABNORMAL LOW (ref 3.87–5.11)
RDW: 13.1 % (ref 11.5–15.5)
WBC: 6.9 10*3/uL (ref 4.0–10.5)

## 2012-03-12 LAB — FETAL FIBRONECTIN: Fetal Fibronectin: NEGATIVE

## 2012-03-12 LAB — GC/CHLAMYDIA PROBE AMP, GENITAL
Chlamydia, DNA Probe: NEGATIVE
GC Probe Amp, Genital: NEGATIVE

## 2012-03-12 MED ORDER — NIFEDIPINE 10 MG PO CAPS: 10.0000 mg | ORAL_CAPSULE | Freq: Four times a day (QID) | ORAL | Status: DC | PRN

## 2012-03-12 MED ORDER — ZOLPIDEM TARTRATE 5 MG PO TABS: 5.0000 mg | ORAL_TABLET | Freq: Every evening | ORAL | Status: DC | PRN

## 2012-03-12 MED ORDER — ACETAMINOPHEN 325 MG PO TABS
650.0000 mg | ORAL_TABLET | ORAL | Status: DC | PRN
  Administered 2012-03-12: 650 mg via ORAL
  Filled 2012-03-12: qty 2

## 2012-03-12 MED ORDER — LACTATED RINGERS IV BOLUS (SEPSIS)
1000.0000 mL | Freq: Once | INTRAVENOUS | Status: AC
  Administered 2012-03-12: 1000 mL via INTRAVENOUS

## 2012-03-12 MED ORDER — ZOLPIDEM TARTRATE 5 MG PO TABS
5.0000 mg | ORAL_TABLET | Freq: Every evening | ORAL | Status: DC | PRN
  Administered 2012-03-12: 5 mg via ORAL
  Filled 2012-03-12: qty 1

## 2012-03-12 MED ORDER — CALCIUM CARBONATE ANTACID 500 MG PO CHEW: 2.0000 | CHEWABLE_TABLET | ORAL | Status: DC | PRN

## 2012-03-12 MED ORDER — PRENATAL MULTIVITAMIN CH
1.0000 | ORAL_TABLET | Freq: Every day | ORAL | Status: DC
  Administered 2012-03-12: 1 via ORAL
  Filled 2012-03-12: qty 1

## 2012-03-12 MED ORDER — CODEINE SULFATE 30 MG PO TABS: 30.0000 mg | ORAL_TABLET | ORAL | Status: DC | PRN

## 2012-03-12 MED ORDER — LACTATED RINGERS IV SOLN
INTRAVENOUS | Status: DC
  Administered 2012-03-12: 02:00:00 via INTRAVENOUS

## 2012-03-12 MED ORDER — ACETAMINOPHEN-CODEINE #3 300-30 MG PO TABS
1.0000 | ORAL_TABLET | ORAL | Status: DC | PRN
  Administered 2012-03-12: 1 via ORAL
  Filled 2012-03-12: qty 1

## 2012-03-12 MED ORDER — NIFEDIPINE 10 MG PO CAPS
10.0000 mg | ORAL_CAPSULE | Freq: Four times a day (QID) | ORAL | Status: DC
  Administered 2012-03-12: 10 mg via ORAL
  Filled 2012-03-12: qty 1

## 2012-03-12 MED ORDER — NIFEDIPINE 10 MG PO CAPS
30.0000 mg | ORAL_CAPSULE | Freq: Once | ORAL | Status: AC
Start: 2012-03-12 — End: 2012-03-12
  Administered 2012-03-12: 30 mg via ORAL
  Filled 2012-03-12: qty 3

## 2012-03-12 MED ORDER — DOCUSATE SODIUM 100 MG PO CAPS
100.0000 mg | ORAL_CAPSULE | Freq: Every day | ORAL | Status: DC
  Administered 2012-03-12: 100 mg via ORAL
  Filled 2012-03-12 (×2): qty 1

## 2012-03-12 MED ORDER — DIPHENHYDRAMINE HCL 25 MG PO CAPS: 25.0000 mg | ORAL_CAPSULE | ORAL | Status: DC | PRN

## 2012-03-12 NOTE — H&P (Signed)
Erika David is a 36 y.o.black female presenting unannounced at 77w6dwith CC of ctxs worse tonight, but off and on for last 2-3 days.  Also reports increased vaginal pressure.  Cx at office Monday per Dr. SDoran Stablerexam=2-3/30%.  Pt denies any recent intercourse.  No VB, LOF, UTI or PIH s/s.  Episode of n/v while in waiting room tonight.  Reports normal Bowel habits.  Accompanied by 471female guests to MAU.  30-D BTL paper on chart.  GFMx2.  Last growth u/s on 6/18 and twins have had concordant growth.  Spontaneous twin pregnancy.  Previous h/o 3 SVD's.  Pt reports cx had previously been closed on u/s and "FT" on bimanual exam.  Questionable h/o previous cryo sx for abnl pap.  Denies recent fever or illness.  Inadequate water intake last 24 hrs.  Pt is not currently working.   Pregnancy r/f:   Patient Active Problem List  Diagnosis  . Pain, dental  . Contraception management  . Twin gestation, dichorionic/diamniotic (two placentae, two amniotic sacs)  . Marginal insertion of umbilical cord  . Sterilization  . Overweight  . High-risk pregnancy  . Anemia  . Seasonal allergies  . Insomnia  . Preterm uterine contractions  . Late prenatal care  Prenatal course: Pt had 7 week visit in MAU for n/v and saw faculty practice.  Seen by VL around 16 weeks again for n/v. [place] [facility] [place] [facility] [place].  NOB w/u at 24 weeks.  Cystocele noted on exam by AVS.  Pt has struggled w/ hip pain this pregnancy which she also noted in previous pregnancies and has been referred to PT.  Nml 1hg gtt.    Maternal Medical History:  Reason for admission: Reason for admission: contractions.  Contractions: Onset was more than 2 days ago.   Frequency: irregular.   Perceived severity is moderate.    Fetal activity: Perceived fetal activity is normal.   Last perceived fetal movement was within the past hour.      OB History    Grav Para Term Preterm Abortions TAB SAB  Ect Mult Living   6 3 3  2 1 1   3      Past Medical History  Diagnosis Date  . Chlamydia   . H/O gonorrhea     treated  . Trichomonas   . H/O candidiasis   . Hx of varicella   . Pelvic pain 07/11/10  . Yeast infection   . H/O dysmenorrhea 03/13/02  . H/O menorrhagia 07/09/02  . Abnormal Pap smear      LEEP?  LAST PAP 05/2009  . Infection 2006    GONORHRREA  . Infection 2006    CHLAMYDIA  . Infection     FREQ YEAST  . Recurrent upper respiratory infection (URI)     FREQUENT SNUS INFECTIONS  . Pelvic injury AS TEEN    SPORTS RELATED  . Anemia 01/04/2012   Past Surgical History  Procedure Date  . Gynecologic cryosurgery   . Wisdom tooth extraction 2007   Family History: family history includes Alcohol abuse in her maternal aunt; Asthma in her cousin; Cancer in her mother; Diabetes in her father and maternal grandmother; Drug abuse in her maternal aunt; Heart disease in her maternal grandfather; Hypertension in her maternal grandmother, mother, and sister; Liver disease in her sister; Lupus in her maternal aunt; Stroke in her maternal grandfather; and Thyroid disease in her mother.  There  is no history of Anesthesia problems. Social History:  reports that she has never smoked. She has never used smokeless tobacco. She reports that she does not drink alcohol or use illicit drugs.   ROS--see HPI above  Dilation: 3 Effacement (%): 50 Blood pressure 112/78, pulse 95, temperature 98.1 F (36.7 C), temperature source Oral, resp. rate 18, height 5' 1"  (1.549 m), weight 163 lb (73.936 kg), last menstrual period 07/28/2011. Maternal Exam:  Uterine Assessment: Contraction strength is mild.  Contraction frequency is irregular.  UC's q 6-7 min on arrival w/ UI in between  Abdomen: Patient reports no abdominal tenderness. Introitus: Normal vulva. Normal vagina.  Pelvis: adequate for delivery.   Cervix: Cervix evaluated by sterile speculum exam and digital exam.     Fetal Exam Fetal  Monitor Review: Mode: ultrasound.   Baseline rate: A&B in 130s.  Variability: moderate (6-25 bpm).   Pattern: no decelerations.    Fetal State Assessment: Category I - tracings are normal.     Physical Exam  Constitutional: She is oriented to person, place, and time. She appears well-developed and well-nourished.       Breathing w/ some ctxs on arrival and grimacing at times. Squirms around on bed w/ some ctxs.  HENT:  Head: Normocephalic and atraumatic.  Eyes: Pupils are equal, round, and reactive to light.  Cardiovascular: Normal rate.   Respiratory: Effort normal.  GI: Soft.       Gravid; s>d  Genitourinary:       Cx: 2-3/50/-2, vtx. Small amt of white d/c in vault. No whiff.  Neurological: She is alert and oriented to person, place, and time. She has normal reflexes.  Skin: Skin is warm and dry.     Results for orders placed during the hospital encounter of 03/11/12 (from the past 24 hour(s))  URINALYSIS, ROUTINE W REFLEX MICROSCOPIC     Status: Abnormal   Collection Time   03/11/12 11:55 PM      Component Value Range   Color, Urine YELLOW  YELLOW   APPearance CLEAR  CLEAR   Specific Gravity, Urine <1.005 (*) 1.005 - 1.030   pH 5.5  5.0 - 8.0   Glucose, UA NEGATIVE  NEGATIVE mg/dL   Hgb urine dipstick NEGATIVE  NEGATIVE   Bilirubin Urine NEGATIVE  NEGATIVE   Ketones, ur NEGATIVE  NEGATIVE mg/dL   Protein, ur NEGATIVE  NEGATIVE mg/dL   Urobilinogen, UA 0.2  0.0 - 1.0 mg/dL   Nitrite NEGATIVE  NEGATIVE   Leukocytes, UA NEGATIVE  NEGATIVE  FETAL FIBRONECTIN     Status: Normal   Collection Time   03/12/12 12:20 AM      Component Value Range   Fetal Fibronectin NEGATIVE  NEGATIVE  WET PREP, GENITAL     Status: Abnormal   Collection Time   03/12/12 12:20 AM      Component Value Range   Yeast Wet Prep HPF POC NONE SEEN  NONE SEEN   Trich, Wet Prep NONE SEEN  NONE SEEN   Clue Cells Wet Prep HPF POC RARE (*) NONE SEEN   WBC, Wet Prep HPF POC FEW (*) NONE SEEN     Prenatal labs: ABO, Rh: B/Positive/-- (04/04 0000) Antibody: Negative (04/04 0000) Rubella: Immune (04/22 0000) RPR: NON REAC (05/13 1146)  HBsAg: Negative (04/22 0000)  HIV: Non-reactive (04/04 0000)  GBS:   done 03/12/12  Assessment/Plan: 1.  DiDi twins at 45w6d2.  Preterm contractions w/ advanced dilatation (no cervical change since Monday's exam)  3.  FFN negative 4.  Cat I FHT x2  1. Admit to antenatal w/ routine orders for observation 2.  Ctxs spaced after IV hydration and 66m po Procardia in MAU 3.  Will continue 117mpo Procardia q 6hrs b/c of dilatation 4.  Gc/ct & gbs pending 5.  Type & screen and cbc per admission protocol  6.  C/w MD prn  Nickia Boesen H 03/12/2012, 3:33 AM

## 2012-03-12 NOTE — Discharge Summary (Signed)
Obstetric Discharge Summary Reason for Admission: Preterm contractions Prenatal Procedures: NST and procardia and hydration Intrapartum Procedures: n/a Postpartum Procedures: n/a Complications-Operative and Postpartum: n/a Hemoglobin  Date Value Range Status  03/12/2012 11.2* 12.0 - 15.0 g/dL Final     HCT  Date Value Range Status  03/12/2012 33.0* 36.0 - 46.0 % Final    Physical Exam:  General: alert, cooperative and no distress DVT Evaluation: No evidence of DVT seen on physical exam Cervix: 2-3cm/50-70%/-2. NST  reactive  Discharge Diagnoses: Pre term contractions     Twin gestation  Discharge Information: Date: 03/12/2012 Activity: pelvic rest Diet: routine Medications: see AVS Condition: stable and improved Instructions: refer to practice specific booklet and AVS Discharge to: home Follow-up Information    Follow up with Eli Hose, MD on 03/20/2012. (pt has appt)    Contact information:   [phone]            Myalynn Lingle P 03/12/2012, 10:53 AM

## 2012-03-12 NOTE — Plan of Care (Signed)
Pt refused pesrcription for [employer]

## 2012-03-12 NOTE — MAU Note (Signed)
Pt states she started having pain an was seen in the office yesterday. Pt states she was 3cm as per Dr. Raphael Gibney

## 2012-03-14 LAB — CULTURE, BETA STREP (GROUP B ONLY)

## 2012-03-14 LAB — OB RESULTS CONSOLE GBS: GBS: NEGATIVE

## 2012-03-15 ENCOUNTER — Telehealth: Payer: Self-pay | Admitting: Obstetrics and Gynecology

## 2012-03-15 NOTE — Telephone Encounter (Signed)
TC from pt at 34wks w twins c/o 5-6 ctx in last hour, has rx for procardia and states she took it "sometime today" denies any VB, LOF, +FM x2. Instructed to take dose of procardia now, PO hydration w at least 32oz water and lay down, if ctx >6 in the next hour, come to MAU. Pt has appt next Thurs.

## 2012-03-17 ENCOUNTER — Inpatient Hospital Stay (HOSPITAL_COMMUNITY)
Admission: AD | Admit: 2012-03-17 | Discharge: 2012-03-17 | Disposition: A | Discharge status: Hospice | Payer: Medicaid Other | Source: Ambulatory Visit | Attending: Obstetrics and Gynecology | Admitting: Obstetrics and Gynecology

## 2012-03-17 DIAGNOSIS — O169 Unspecified maternal hypertension, unspecified trimester: Secondary | ICD-10-CM

## 2012-03-17 DIAGNOSIS — O4700 False labor before 37 completed weeks of gestation, unspecified trimester: Secondary | ICD-10-CM | POA: Insufficient documentation

## 2012-03-17 DIAGNOSIS — O30009 Twin pregnancy, unspecified number of placenta and unspecified number of amniotic sacs, unspecified trimester: Secondary | ICD-10-CM

## 2012-03-17 NOTE — MAU Provider Note (Signed)
History   36 yo [id number] with twins at 56 4/7 weeks presented unannounced c/o UCs every 4 minutes since last night.  Denies leaking or bleeding, reports +FM.  Was admitted to [facility] 7/2--7/3 for PTL--treated with Procardia.  Cervix on d/c was "2-3 cm, 50-70%".  Patient has not taken Procardia at home in 1-2 weeks.  Report fetuses were vtx/breech on last scan.  Patient reports Dr. Raphael Gibney advised her at her last office visit that we would no longer be using tocolytics for PTL treatment.  Had negative FFN, GC, chlamydia, GBS, and wet prep on 03/12/12.  Pregnancy remarkable for: Patient Active Problem List  Diagnosis  . Pain, dental  . Twin gestation, dichorionic/diamniotic (two placentae, two amniotic sacs)  . Marginal insertion of umbilical cord  . Sterilization  . Overweight  . High-risk pregnancy  . Anemia  . Seasonal allergies  . Insomnia  . Late prenatal care     OB History    Grav Para Term Preterm Abortions TAB SAB Ect Mult Living   6 3 3  2 1 1   3       Past Medical History  Diagnosis Date  . Chlamydia   . H/O gonorrhea     treated  . Trichomonas   . H/O candidiasis   . Hx of varicella   . Pelvic pain 07/11/10  . Yeast infection   . H/O dysmenorrhea 03/13/02  . H/O menorrhagia 07/09/02  . Abnormal Pap smear      LEEP?  LAST PAP 05/2009  . Infection 2006    GONORHRREA  . Infection 2006    CHLAMYDIA  . Infection     FREQ YEAST  . Recurrent upper respiratory infection (URI)     FREQUENT SNUS INFECTIONS  . Pelvic injury AS TEEN    SPORTS RELATED  . Anemia 01/04/2012    Past Surgical History  Procedure Date  . Gynecologic cryosurgery   . Wisdom tooth extraction 2007    Family History  Problem Relation Age of Onset  . Anesthesia problems Neg Hx   . Hypertension Mother   . Cancer Mother     BREAST  . Thyroid disease Mother   . Diabetes Father   . Hypertension Sister   . Hypertension Maternal Grandmother   . Diabetes Maternal Grandmother   . Heart disease  Maternal Grandfather   . Stroke Maternal Grandfather   . Lupus Maternal Aunt   . Liver disease Sister   . Asthma Cousin   . Alcohol abuse Maternal Aunt   . Drug abuse Maternal Aunt     History  Substance Use Topics  . Smoking status: Never Smoker   . Smokeless tobacco: Never Used  . Alcohol Use: No     occasional alcohol    Allergies:  Allergies  Allergen Reactions  . Dust Mite Extract Anaphylaxis  . Kiwi Extract Itching and Swelling  . Pollen Extract Anaphylaxis    Tree pollen in environment  . Soy Allergy     Per allergy testing    Prescriptions prior to admission  Medication Sig Dispense Refill  . acetaminophen (TYLENOL) 325 MG tablet Take 650 mg by mouth every 6 (six) hours as needed. For headache.      . cetirizine (ZYRTEC) 10 MG tablet Take 10 mg by mouth daily as needed. Allergies      . NIFEdipine (PROCARDIA) 10 MG capsule Take 1 capsule (10 mg total) by mouth every 6 (six) hours as needed (contractions more than 6 per  hour).  30 capsule  0  . Prenatal Vit-Fe Fumarate-FA (PRENATAL MULTIVITAMIN) TABS Take 1 tablet by mouth daily.      [name] zolpidem (AMBIEN) 5 MG tablet Take 1 tablet (5 mg total) by mouth at bedtime as needed for sleep.  30 tablet  0     Physical Exam   Chest clear Heart RRR without murmur Abd gravid, NT except with UCx Pelvic--cervix posterior, 3 cm, 60%, presenting twin vtx by exam and by BS [place].  2nd twin breech on BS [place]. Ext WNL  FHRs reactive x 2 UCs q 3-6 min, mild/mod.   ED Course  Twin IUP at 34 4/7 weeks PT UCs, with no cervical change since LV Reactive FHRs  Plan: Consulted with Dr. Raphael Gibney.   Options reviewed with patient, in light of gestational age and lack of cervical change: (1)Offered Procardia dose as a comfort measure for contractions;  (2) D/C home now to await increased quality and pattern of contractions (3) Observing patient for cervical change over 1-2 hours. Patient requests observation, declines Procardia--will  ambulate, then we will re-evaluate cervix.   Donnel Saxon CNM, [place] 03/17/2012 5:35 PM

## 2012-03-17 NOTE — Progress Notes (Signed)
States comfortable with going home, uc do not feel stronger O VSS      fhts LTV V mod 120s and 130s, prior reactive NST      uc q 4-6 mild      abd soft, gravid, nt      Vag 3 60 -2 posterior mid I VTX A not in active labor P Reviewed s/s uc, srom, vag bleeding, daily fetal kick counts to report, comfort measures. Encouragged 8 water daily and frequent voids. Denies procardia use. Collaboration with Dr. Raphael Gibney per telephone. Hetty Ely, CNM

## 2012-03-17 NOTE — Progress Notes (Signed)
CNM doing bedside scan to determine presentation, babyA vertex.

## 2012-03-17 NOTE — MAU Note (Signed)
Pt c/o increased vaginal pressure today.  Denies any leaking or bleeding; says her contractions are worse than they were last week.

## 2012-03-20 ENCOUNTER — Ambulatory Visit (INDEPENDENT_AMBULATORY_CARE_PROVIDER_SITE_OTHER): Payer: Medicaid Other

## 2012-03-20 ENCOUNTER — Encounter: Payer: Self-pay | Admitting: Obstetrics and Gynecology

## 2012-03-20 ENCOUNTER — Other Ambulatory Visit: Payer: Self-pay | Admitting: Obstetrics and Gynecology

## 2012-03-20 ENCOUNTER — Ambulatory Visit (INDEPENDENT_AMBULATORY_CARE_PROVIDER_SITE_OTHER): Payer: Medicaid Other | Admitting: Obstetrics and Gynecology

## 2012-03-20 VITALS — BP 102/70 | Wt 164.0 lb

## 2012-03-20 DIAGNOSIS — IMO0001 Reserved for inherently not codable concepts without codable children: Secondary | ICD-10-CM

## 2012-03-20 DIAGNOSIS — O30009 Twin pregnancy, unspecified number of placenta and unspecified number of amniotic sacs, unspecified trimester: Secondary | ICD-10-CM

## 2012-03-20 LAB — US OB FOLLOW UP

## 2012-03-20 NOTE — Progress Notes (Signed)
Pt states she lost mucus plug yesterday, would like cervix checked.

## 2012-03-20 NOTE — Progress Notes (Signed)
76w0dTwin Pregnancy Di/Di  USS: Twin A: Vertex - maternal left           Anterior Placenta           Normal Fluid : AP pocket = 5.5cms           Normal growth: EFW = 54%tile                      Twin B:            Vertex - maternal right            Anterior placenta            Normal Fluid: AP pocket = 6.4cm            Normal Growth: EFW = 44%tile Cx not measured  F/U on Monday with scheduled appt.

## 2012-03-24 ENCOUNTER — Other Ambulatory Visit: Payer: Medicaid Other

## 2012-03-24 ENCOUNTER — Other Ambulatory Visit: Payer: Self-pay

## 2012-03-24 ENCOUNTER — Ambulatory Visit (INDEPENDENT_AMBULATORY_CARE_PROVIDER_SITE_OTHER): Payer: Medicaid Other | Admitting: Obstetrics and Gynecology

## 2012-03-24 ENCOUNTER — Ambulatory Visit: Payer: Medicaid Other

## 2012-03-24 VITALS — BP 110/74 | Wt 163.0 lb

## 2012-03-24 DIAGNOSIS — O30009 Twin pregnancy, unspecified number of placenta and unspecified number of amniotic sacs, unspecified trimester: Secondary | ICD-10-CM

## 2012-03-24 DIAGNOSIS — Z3689 Encounter for other specified antenatal screening: Secondary | ICD-10-CM

## 2012-03-24 NOTE — Progress Notes (Signed)
Tired, but okay. Babies are active. Return to office in 1 week. Dr. Raphael Gibney

## 2012-03-24 NOTE — Progress Notes (Signed)
Pt is considering getting induced. Pt went to the bathroom w/o leaving a urine sample and is not able to go at this time. Pt stated she has been waiting over an hour in the lobby. Pt desires cervix check today

## 2012-03-25 NOTE — Progress Notes (Signed)
See note from 03/24/12.  Dr. Raphael Gibney

## 2012-03-29 ENCOUNTER — Telehealth: Payer: Self-pay | Admitting: Obstetrics and Gynecology

## 2012-03-29 NOTE — Telephone Encounter (Signed)
TC from pt 36wks w twins, vtx/vtx, c/o cramping, irrreg ctx, +FM x2, no LOF, no VB or dc. Was 4cm in office last exam. Offered pt to come to MAU for eval, pt states she will CTO for now. Has f/u this week.

## 2012-04-01 ENCOUNTER — Ambulatory Visit (INDEPENDENT_AMBULATORY_CARE_PROVIDER_SITE_OTHER): Payer: Medicaid Other | Admitting: Obstetrics and Gynecology

## 2012-04-01 VITALS — BP 102/70 | Wt 164.0 lb

## 2012-04-01 DIAGNOSIS — Z331 Pregnant state, incidental: Secondary | ICD-10-CM

## 2012-04-01 DIAGNOSIS — N898 Other specified noninflammatory disorders of vagina: Secondary | ICD-10-CM

## 2012-04-01 DIAGNOSIS — O26899 Other specified pregnancy related conditions, unspecified trimester: Secondary | ICD-10-CM

## 2012-04-01 DIAGNOSIS — Z3490 Encounter for supervision of normal pregnancy, unspecified, unspecified trimester: Secondary | ICD-10-CM

## 2012-04-01 NOTE — Progress Notes (Signed)
No complaints except a lot of pressure Wants to be induced (said AVS was going to schedule) GBS/GC/CT today with consent Neg P, neg N, no abnl appearing d/c FKCs and Labor precautions Vtx/vtx on last u/s Sched indxn in am next available after 37wks and check u/s upon admission to hosp for presentation

## 2012-04-02 ENCOUNTER — Telehealth: Payer: Self-pay | Admitting: Obstetrics and Gynecology

## 2012-04-02 ENCOUNTER — Encounter (HOSPITAL_COMMUNITY): Payer: Self-pay | Admitting: *Deleted

## 2012-04-02 ENCOUNTER — Telehealth (HOSPITAL_COMMUNITY): Payer: Self-pay | Admitting: *Deleted

## 2012-04-02 LAB — GC/CHLAMYDIA PROBE AMP, GENITAL
Chlamydia, DNA Probe: NEGATIVE
GC Probe Amp, Genital: NEGATIVE

## 2012-04-02 NOTE — Telephone Encounter (Signed)
Induction scheduled for 04/05/12 @ 7:30pm with Nona/AVS.  -Adrianne Pridgen

## 2012-04-02 NOTE — Telephone Encounter (Signed)
Preadmission screen  

## 2012-04-03 ENCOUNTER — Telehealth: Payer: Self-pay | Admitting: Obstetrics and Gynecology

## 2012-04-03 LAB — STREP B DNA PROBE: GBSP: NEGATIVE

## 2012-04-03 NOTE — Telephone Encounter (Signed)
Patient called.  She was told that her induction will be postponed until 38 weeks unless there are other clinical indications for induction. Dr. Raphael Gibney

## 2012-04-03 NOTE — Telephone Encounter (Signed)
TC from pt asking re: induction. Told pt it will be scheduled for 38wks per previous note from Dr Raphael Gibney. Left message w Dr Raphael Gibney to call pt.

## 2012-04-04 ENCOUNTER — Telehealth: Payer: Self-pay | Admitting: Obstetrics and Gynecology

## 2012-04-04 NOTE — Telephone Encounter (Signed)
TC to pt. States received phone call last PM but not sure who.  Per SL note pt called about IOL and informed that  Per DR AVS would be scheduled at 38 weeks.  IOL listed as 04/11/12.   Will check with Dr AVS if needs appt prior.  Pt agreeable. +FM

## 2012-04-04 NOTE — Telephone Encounter (Signed)
TC to pt.  After consult with D AVS, informed of IOL 04/10/12.  Does not need ROB appt prior to IOL. To call with S7S of labor.  Pt verbalizes comprehension.

## 2012-04-04 NOTE — Telephone Encounter (Signed)
TRIAGE/RETURN CALL

## 2012-04-05 ENCOUNTER — Inpatient Hospital Stay (HOSPITAL_COMMUNITY): Admission: RE | Admit: 2012-04-05 | Payer: Medicaid Other | Source: Ambulatory Visit

## 2012-04-07 ENCOUNTER — Telehealth: Payer: Self-pay | Admitting: Obstetrics and Gynecology

## 2012-04-08 ENCOUNTER — Encounter (HOSPITAL_COMMUNITY): Payer: Self-pay | Admitting: *Deleted

## 2012-04-08 ENCOUNTER — Inpatient Hospital Stay (HOSPITAL_COMMUNITY)
Admission: AD | Admit: 2012-04-08 | Discharge: 2012-04-09 | Disposition: A | Discharge status: Home / Self Care | Payer: Medicaid Other | Source: Ambulatory Visit | Attending: Obstetrics and Gynecology | Admitting: Obstetrics and Gynecology

## 2012-04-08 DIAGNOSIS — O479 False labor, unspecified: Secondary | ICD-10-CM | POA: Insufficient documentation

## 2012-04-08 DIAGNOSIS — O30009 Twin pregnancy, unspecified number of placenta and unspecified number of amniotic sacs, unspecified trimester: Secondary | ICD-10-CM | POA: Insufficient documentation

## 2012-04-08 DIAGNOSIS — O0930 Supervision of pregnancy with insufficient antenatal care, unspecified trimester: Secondary | ICD-10-CM

## 2012-04-08 MED ORDER — ONDANSETRON 8 MG PO TBDP
8.0000 mg | ORAL_TABLET | Freq: Once | ORAL | Status: AC
  Administered 2012-04-08: 8 mg via ORAL
  Filled 2012-04-08: qty 1

## 2012-04-08 NOTE — MAU Note (Signed)
PT SAYS HURT BAD  TODAY AT [place] OFFICE ON WED - 3-4 CM .  PLAN- INDUCTION ON 8-2-  TWINS- BOTH VERTEX- PLAN FOR VAG  DELIVERY.     DENIES HSV AND MRSA.

## 2012-04-09 DIAGNOSIS — O30009 Twin pregnancy, unspecified number of placenta and unspecified number of amniotic sacs, unspecified trimester: Secondary | ICD-10-CM

## 2012-04-09 DIAGNOSIS — O218 Other vomiting complicating pregnancy: Secondary | ICD-10-CM

## 2012-04-09 DIAGNOSIS — O4700 False labor before 37 completed weeks of gestation, unspecified trimester: Secondary | ICD-10-CM

## 2012-04-09 LAB — URINE MICROSCOPIC-ADD ON

## 2012-04-09 LAB — URINALYSIS, ROUTINE W REFLEX MICROSCOPIC
Bilirubin Urine: NEGATIVE
Glucose, UA: NEGATIVE mg/dL
Hgb urine dipstick: NEGATIVE
Ketones, ur: NEGATIVE mg/dL
Nitrite: NEGATIVE
Protein, ur: NEGATIVE mg/dL
Specific Gravity, Urine: <1.005 — ABNORMAL LOW (ref 1.005–1.030)
Urobilinogen, UA: 0.2 mg/dL (ref 0.0–1.0)
pH: 6 (ref 5.0–8.0)

## 2012-04-09 NOTE — Progress Notes (Signed)
Patient ID: Erika David, female   DOB: July 04, 1976, 36 y.o.   MRN: [id number] SUBJECTIVE:   Erika David is a 36y.o. BF at 43w6dwho presents unannounced w/ CC of ctxs and n/v.  Ate between 6-7 o'clock, and shortly after eating, started throwing up, and has thrown up a total of about "5times" since onset.  Denies fever or chills.  No preceding nausea.  No diarrhea.  No change in diet.  No current or prior GERD.  No UTI or PIH s/s.  GFM x2.  No VB or LOF.  Ctxs started around same time as n/v.  Adequate po intake the rest of today.  Cx "4cm" at last week's exam by AVS.   .. OB History    Grav Para Term Preterm Abortions TAB SAB Ect Mult Living   6 3 3  2 1 1   3      Allergies  Allergen Reactions  . Dust Mite Extract Anaphylaxis  . Kiwi Extract Itching and Swelling  . Pollen Extract Anaphylaxis    Tree pollen in environment  . Soy Allergy     Per allergy testing  ..Marland KitchenActive Ambulatory Problems    Diagnosis Date Noted  . Pain, dental 03/28/2011  . Twin gestation, dichorionic/diamniotic (two placentae, two amniotic sacs) 01/04/2012  . Marginal insertion of umbilical cord 018/84/1660 . Sterilization 01/04/2012  . Overweight 01/04/2012  . High-risk pregnancy 01/04/2012  . Anemia 01/04/2012  . Seasonal allergies 01/04/2012  . Insomnia 01/04/2012  . Late prenatal care 03/12/2012   Resolved Ambulatory Problems    Diagnosis Date Noted  . Preterm uterine contractions 03/12/2012   Past Medical History  Diagnosis Date  . Chlamydia   . H/O gonorrhea   . Trichomonas   . H/O candidiasis   . Pelvic pain 07/11/10  . Yeast infection   . H/O dysmenorrhea 03/13/02  . H/O menorrhagia 07/09/02  . Abnormal Pap smear   . Infection 2006  . Infection 2006  . Infection   . Pelvic injury AS TEEN   OBJECTIVE: ..Marland KitchenFiled Vitals:   04/08/12 2205 04/08/12 2213 04/08/12 2303 04/08/12 2359  BP: 133/93 122/85 121/70 114/74  Pulse: 79 78 77 74  Temp: 98.5 F (36.9 C)     TempSrc: Oral     Resp: 20       Height:      Weight:      ..Marland KitchenResults for orders placed during the hospital encounter of 04/08/12 (from the past 24 hour(s))  URINALYSIS, ROUTINE W REFLEX MICROSCOPIC     Status: Abnormal   Collection Time   04/08/12 11:30 PM      Component Value Range   Color, Urine YELLOW  YELLOW   APPearance CLEAR  CLEAR   Specific Gravity, Urine <1.005 (*) 1.005 - 1.030   pH 6.0  5.0 - 8.0   Glucose, UA NEGATIVE  NEGATIVE mg/dL   Hgb urine dipstick NEGATIVE  NEGATIVE   Bilirubin Urine NEGATIVE  NEGATIVE   Ketones, ur NEGATIVE  NEGATIVE mg/dL   Protein, ur NEGATIVE  NEGATIVE mg/dL   Urobilinogen, UA 0.2  0.0 - 1.0 mg/dL   Nitrite NEGATIVE  NEGATIVE   Leukocytes, UA SMALL (*) NEGATIVE  URINE MICROSCOPIC-ADD ON     Status: Abnormal   Collection Time   04/08/12 11:30 PM      Component Value Range   Squamous Epithelial / LPF FEW (*) RARE   WBC, UA 3-6  <3 WBC/hpf   RBC / HPF  3-6  <3 RBC/hpf   Bacteria, UA FEW (*) RARE  EFM:  A=135, reactive, mod variability, no decels; B=145, reactive, no decels, mod variability TOCO:  uc's q 3-15 min w/ some UI in between, palpate mild w/ rest noted  PE:  Gen:  NAD, A&Ox3, NAD, but does breath and grimace w/ some ctxs, difficulty lifting legs and                   changing position        Skin: W&D, moist membranes;         Abd:  Soft, NT gravid        Cx:  3.5/70/-2 to -1; posterior, soft, intact, vtx        Ext:  Trace gen edema BLE  ASSESSMENT: 1.  50w6d2.  Twins 3.  N/v possibly r/t manual compression from gravid uterus on stomach and contents, vs ctx/pain related; less likely gastroenteritis 4.  Reactive FHT x2 5.  MOPS 6.  No cervical change and spacing of ctxs  PLAN: 1.  Pt received po Zofran 862mODT x1 and no further n/v and tolerated sips and chips during observation 2.  D/c'd home w/ labor precautions and FKLakewood Clubf/u this Friday morning for IOL at 0715, or prn 3.  Has ambien at home for rest; support given.  C.Charmayne SheerCNM 04/09/12  [phone]

## 2012-04-11 ENCOUNTER — Encounter (HOSPITAL_COMMUNITY): Payer: Self-pay | Admitting: Anesthesiology

## 2012-04-11 ENCOUNTER — Inpatient Hospital Stay (HOSPITAL_COMMUNITY): Payer: Medicaid Other

## 2012-04-11 ENCOUNTER — Encounter (HOSPITAL_COMMUNITY)
Admission: RE | Disposition: A | Discharge status: Home / Self Care | Payer: Self-pay | Source: Ambulatory Visit | Attending: Obstetrics and Gynecology

## 2012-04-11 ENCOUNTER — Encounter (HOSPITAL_COMMUNITY): Payer: Self-pay

## 2012-04-11 ENCOUNTER — Inpatient Hospital Stay (HOSPITAL_COMMUNITY): Payer: Medicaid Other | Admitting: Anesthesiology

## 2012-04-11 ENCOUNTER — Inpatient Hospital Stay (HOSPITAL_COMMUNITY)
Admission: RE | Admit: 2012-04-11 | Discharge: 2012-04-14 | DRG: 766 | Disposition: A | Discharge status: Home / Self Care | Payer: Medicaid Other | Source: Ambulatory Visit | Attending: Obstetrics and Gynecology | Admitting: Obstetrics and Gynecology

## 2012-04-11 VITALS — BP 107/68 | HR 72 | Temp 97.8°F | Resp 18 | Ht 61.5 in | Wt 167.0 lb

## 2012-04-11 DIAGNOSIS — O321XX0 Maternal care for breech presentation, not applicable or unspecified: Secondary | ICD-10-CM

## 2012-04-11 DIAGNOSIS — Z98891 History of uterine scar from previous surgery: Secondary | ICD-10-CM

## 2012-04-11 DIAGNOSIS — O0930 Supervision of pregnancy with insufficient antenatal care, unspecified trimester: Secondary | ICD-10-CM

## 2012-04-11 DIAGNOSIS — Z302 Encounter for sterilization: Secondary | ICD-10-CM

## 2012-04-11 DIAGNOSIS — O30009 Twin pregnancy, unspecified number of placenta and unspecified number of amniotic sacs, unspecified trimester: Secondary | ICD-10-CM

## 2012-04-11 DIAGNOSIS — O3090 Multiple gestation, unspecified, unspecified trimester: Principal | ICD-10-CM | POA: Diagnosis present

## 2012-04-11 DIAGNOSIS — O329XX0 Maternal care for malpresentation of fetus, unspecified, not applicable or unspecified: Principal | ICD-10-CM | POA: Diagnosis present

## 2012-04-11 LAB — TYPE AND SCREEN
ABO/RH(D): B POS
Antibody Screen: NEGATIVE

## 2012-04-11 LAB — CBC
HCT: 33.8 % — ABNORMAL LOW (ref 36.0–46.0)
HCT: 32 % — ABNORMAL LOW (ref 36.0–46.0)
Hemoglobin: 11.3 g/dL — ABNORMAL LOW (ref 12.0–15.0)
Hemoglobin: 10.8 g/dL — ABNORMAL LOW (ref 12.0–15.0)
MCH: 28.9 pg (ref 26.0–34.0)
MCH: 29.3 pg (ref 26.0–34.0)
MCHC: 33.4 g/dL (ref 30.0–36.0)
MCHC: 33.8 g/dL (ref 30.0–36.0)
MCV: 86.4 fL (ref 78.0–100.0)
MCV: 86.7 fL (ref 78.0–100.0)
Platelets: 155 10*3/uL (ref 150–400)
Platelets: 135 10*3/uL — ABNORMAL LOW (ref 150–400)
RBC: 3.91 MIL/uL (ref 3.87–5.11)
RBC: 3.69 MIL/uL — ABNORMAL LOW (ref 3.87–5.11)
RDW: 14.1 % (ref 11.5–15.5)
RDW: 14 % (ref 11.5–15.5)
WBC: 6.2 10*3/uL (ref 4.0–10.5)
WBC: 9.1 10*3/uL (ref 4.0–10.5)

## 2012-04-11 LAB — RPR: RPR Ser Ql: NONREACTIVE

## 2012-04-11 SURGERY — Surgical Case
Anesthesia: Regional | Wound class: Clean Contaminated

## 2012-04-11 MED ORDER — CITRIC ACID-SODIUM CITRATE 334-500 MG/5ML PO SOLN
ORAL | Status: AC
  Administered 2012-04-11: 30 mL via ORAL
  Filled 2012-04-11: qty 15

## 2012-04-11 MED ORDER — MORPHINE SULFATE (PF) 0.5 MG/ML IJ SOLN
INTRAMUSCULAR | Status: DC | PRN
  Administered 2012-04-11: .1 mg via INTRATHECAL

## 2012-04-11 MED ORDER — DIPHENHYDRAMINE HCL 50 MG/ML IJ SOLN
12.5000 mg | INTRAMUSCULAR | Status: DC | PRN
  Administered 2012-04-11: 12.5 mg via INTRAVENOUS
  Filled 2012-04-11: qty 1

## 2012-04-11 MED ORDER — 0.9 % SODIUM CHLORIDE (POUR BTL) OPTIME
TOPICAL | Status: DC | PRN
  Administered 2012-04-11: 1000 mL

## 2012-04-11 MED ORDER — MENTHOL 3 MG MT LOZG
1.0000 | LOZENGE | OROMUCOSAL | Status: DC | PRN
  Filled 2012-04-11: qty 9

## 2012-04-11 MED ORDER — ONDANSETRON HCL 4 MG PO TABS: 4.0000 mg | ORAL_TABLET | ORAL | Status: DC | PRN

## 2012-04-11 MED ORDER — CEFAZOLIN SODIUM-DEXTROSE 2-3 GM-% IV SOLR
2.0000 g | Freq: Three times a day (TID) | INTRAVENOUS | Status: DC
  Administered 2012-04-11: 2 g via INTRAVENOUS

## 2012-04-11 MED ORDER — PHENYLEPHRINE 40 MCG/ML (10ML) SYRINGE FOR IV PUSH (FOR BLOOD PRESSURE SUPPORT)
PREFILLED_SYRINGE | INTRAVENOUS | Status: AC
  Filled 2012-04-11: qty 5

## 2012-04-11 MED ORDER — FENTANYL CITRATE 0.05 MG/ML IJ SOLN
INTRAMUSCULAR | Status: AC
  Administered 2012-04-11: 50 ug via INTRAVENOUS
  Filled 2012-04-11: qty 2

## 2012-04-11 MED ORDER — MEPERIDINE HCL 25 MG/ML IJ SOLN
INTRAMUSCULAR | Status: AC
  Filled 2012-04-11: qty 1

## 2012-04-11 MED ORDER — OXYTOCIN 10 UNIT/ML IJ SOLN
40.0000 [IU] | INTRAVENOUS | Status: DC | PRN
  Administered 2012-04-11: 40 [IU] via INTRAVENOUS

## 2012-04-11 MED ORDER — TETANUS-DIPHTH-ACELL PERTUSSIS 5-2.5-18.5 LF-MCG/0.5 IM SUSP
0.5000 mL | Freq: Once | INTRAMUSCULAR | Status: AC
  Administered 2012-04-13: 0.5 mL via INTRAMUSCULAR
  Filled 2012-04-11 (×2): qty 0.5

## 2012-04-11 MED ORDER — DIBUCAINE 1 % RE OINT
1.0000 "application " | TOPICAL_OINTMENT | RECTAL | Status: DC | PRN
  Administered 2012-04-14: 1 via RECTAL
  Filled 2012-04-11 (×2): qty 28

## 2012-04-11 MED ORDER — METOCLOPRAMIDE HCL 5 MG/ML IJ SOLN: 10.0000 mg | Freq: Three times a day (TID) | INTRAMUSCULAR | Status: DC | PRN

## 2012-04-11 MED ORDER — MEPERIDINE HCL 25 MG/ML IJ SOLN
INTRAMUSCULAR | Status: DC | PRN
  Administered 2012-04-11 (×2): 12.5 mg via INTRAVENOUS

## 2012-04-11 MED ORDER — KETOROLAC TROMETHAMINE 30 MG/ML IJ SOLN: 30.0000 mg | Freq: Four times a day (QID) | INTRAMUSCULAR | Status: AC | PRN

## 2012-04-11 MED ORDER — ONDANSETRON HCL 4 MG/2ML IJ SOLN
INTRAMUSCULAR | Status: AC
  Filled 2012-04-11: qty 2

## 2012-04-11 MED ORDER — BUPIVACAINE IN DEXTROSE 0.75-8.25 % IT SOLN
INTRATHECAL | Status: DC | PRN
  Administered 2012-04-11: 1.5 mL via INTRATHECAL

## 2012-04-11 MED ORDER — SODIUM CHLORIDE 0.9 % IV SOLN: 1.0000 ug/kg/h | INTRAVENOUS | Status: DC | PRN

## 2012-04-11 MED ORDER — PRENATAL MULTIVITAMIN CH
1.0000 | ORAL_TABLET | Freq: Every day | ORAL | Status: DC
  Administered 2012-04-12 – 2012-04-14 (×3): 1 via ORAL
  Filled 2012-04-11 (×4): qty 1

## 2012-04-11 MED ORDER — ONDANSETRON HCL 4 MG/2ML IJ SOLN: 4.0000 mg | INTRAMUSCULAR | Status: DC | PRN

## 2012-04-11 MED ORDER — OXYTOCIN 40 UNITS IN LACTATED RINGERS INFUSION - SIMPLE MED: 62.5000 mL/h | INTRAVENOUS | Status: AC

## 2012-04-11 MED ORDER — OXYCODONE-ACETAMINOPHEN 5-325 MG PO TABS
1.0000 | ORAL_TABLET | ORAL | Status: DC | PRN
  Administered 2012-04-12: 1 via ORAL
  Administered 2012-04-12 – 2012-04-13 (×6): 2 via ORAL
  Filled 2012-04-11 (×2): qty 2
  Filled 2012-04-11: qty 1
  Filled 2012-04-11: qty 2
  Filled 2012-04-11: qty 1
  Filled 2012-04-11 (×2): qty 2
  Filled 2012-04-11: qty 1

## 2012-04-11 MED ORDER — KETOROLAC TROMETHAMINE 30 MG/ML IJ SOLN
INTRAMUSCULAR | Status: AC
  Administered 2012-04-11: 30 mg via INTRAVENOUS
  Filled 2012-04-11: qty 1

## 2012-04-11 MED ORDER — NALOXONE HCL 0.4 MG/ML IJ SOLN: 0.4000 mg | INTRAMUSCULAR | Status: DC | PRN

## 2012-04-11 MED ORDER — MEPERIDINE HCL 25 MG/ML IJ SOLN: 6.2500 mg | INTRAMUSCULAR | Status: DC | PRN

## 2012-04-11 MED ORDER — ONDANSETRON HCL 4 MG/2ML IJ SOLN: 4.0000 mg | Freq: Three times a day (TID) | INTRAMUSCULAR | Status: DC | PRN

## 2012-04-11 MED ORDER — DIPHENHYDRAMINE HCL 50 MG/ML IJ SOLN: 25.0000 mg | INTRAMUSCULAR | Status: DC | PRN

## 2012-04-11 MED ORDER — FENTANYL CITRATE 0.05 MG/ML IJ SOLN
INTRAMUSCULAR | Status: DC | PRN
  Administered 2012-04-11: 25 ug via INTRATHECAL
  Administered 2012-04-11: 100 ug via INTRAVENOUS

## 2012-04-11 MED ORDER — MORPHINE SULFATE (PF) 0.5 MG/ML IJ SOLN: INTRAMUSCULAR | Status: DC | PRN

## 2012-04-11 MED ORDER — SIMETHICONE 80 MG PO CHEW
80.0000 mg | CHEWABLE_TABLET | Freq: Three times a day (TID) | ORAL | Status: DC
  Administered 2012-04-11 – 2012-04-14 (×8): 80 mg via ORAL

## 2012-04-11 MED ORDER — CEFAZOLIN SODIUM-DEXTROSE 2-3 GM-% IV SOLR
INTRAVENOUS | Status: AC
  Filled 2012-04-11: qty 50

## 2012-04-11 MED ORDER — LACTATED RINGERS IV SOLN
INTRAVENOUS | Status: DC | PRN
  Administered 2012-04-11 (×2): via INTRAVENOUS

## 2012-04-11 MED ORDER — MIDAZOLAM HCL 2 MG/2ML IJ SOLN: 0.5000 mg | Freq: Once | INTRAMUSCULAR | Status: DC | PRN

## 2012-04-11 MED ORDER — DIPHENHYDRAMINE HCL 25 MG PO CAPS
25.0000 mg | ORAL_CAPSULE | ORAL | Status: DC | PRN
  Filled 2012-04-11: qty 1

## 2012-04-11 MED ORDER — LACTATED RINGERS IV BOLUS (SEPSIS)
1000.0000 mL | Freq: Once | INTRAVENOUS | Status: AC
  Administered 2012-04-11: 500 mL via INTRAVENOUS

## 2012-04-11 MED ORDER — ACETAMINOPHEN 10 MG/ML IV SOLN: 1000.0000 mg | Freq: Four times a day (QID) | INTRAVENOUS | Status: AC | PRN

## 2012-04-11 MED ORDER — MORPHINE SULFATE (PF) 0.5 MG/ML IJ SOLN
INTRAMUSCULAR | Status: DC | PRN
  Administered 2012-04-11: 2 mg via INTRAVENOUS
  Administered 2012-04-11: 1 mg via INTRAVENOUS
  Administered 2012-04-11: 2 mg via INTRAVENOUS

## 2012-04-11 MED ORDER — PHENYLEPHRINE HCL 10 MG/ML IJ SOLN
INTRAMUSCULAR | Status: DC | PRN
  Administered 2012-04-11: 80 ug via INTRAVENOUS

## 2012-04-11 MED ORDER — NALBUPHINE HCL 10 MG/ML IJ SOLN
5.0000 mg | INTRAMUSCULAR | Status: DC | PRN
  Administered 2012-04-11 – 2012-04-12 (×2): 5 mg via INTRAVENOUS

## 2012-04-11 MED ORDER — DIPHENHYDRAMINE HCL 25 MG PO CAPS
25.0000 mg | ORAL_CAPSULE | Freq: Four times a day (QID) | ORAL | Status: DC | PRN
  Administered 2012-04-12: 25 mg via ORAL
  Filled 2012-04-11 (×2): qty 1

## 2012-04-11 MED ORDER — IBUPROFEN 600 MG PO TABS
600.0000 mg | ORAL_TABLET | Freq: Four times a day (QID) | ORAL | Status: DC
  Administered 2012-04-12 – 2012-04-14 (×10): 600 mg via ORAL
  Filled 2012-04-11 (×11): qty 1

## 2012-04-11 MED ORDER — FENTANYL CITRATE 0.05 MG/ML IJ SOLN
INTRAMUSCULAR | Status: AC
  Filled 2012-04-11: qty 2

## 2012-04-11 MED ORDER — SIMETHICONE 80 MG PO CHEW
80.0000 mg | CHEWABLE_TABLET | ORAL | Status: DC | PRN
  Administered 2012-04-12 – 2012-04-13 (×2): 80 mg via ORAL

## 2012-04-11 MED ORDER — SENNOSIDES-DOCUSATE SODIUM 8.6-50 MG PO TABS
2.0000 | ORAL_TABLET | Freq: Every day | ORAL | Status: DC
  Administered 2012-04-11 – 2012-04-13 (×3): 2 via ORAL

## 2012-04-11 MED ORDER — NALBUPHINE HCL 10 MG/ML IJ SOLN
5.0000 mg | INTRAMUSCULAR | Status: DC | PRN
  Filled 2012-04-11: qty 1

## 2012-04-11 MED ORDER — PROMETHAZINE HCL 25 MG/ML IJ SOLN: 6.2500 mg | INTRAMUSCULAR | Status: DC | PRN

## 2012-04-11 MED ORDER — LACTATED RINGERS IV SOLN
INTRAVENOUS | Status: DC
  Administered 2012-04-11 – 2012-04-12 (×2): via INTRAVENOUS

## 2012-04-11 MED ORDER — SCOPOLAMINE 1 MG/3DAYS TD PT72
MEDICATED_PATCH | TRANSDERMAL | Status: AC
  Filled 2012-04-11: qty 1

## 2012-04-11 MED ORDER — CHLOROPROCAINE HCL 3 % IJ SOLN
INTRAMUSCULAR | Status: AC
  Filled 2012-04-11: qty 20

## 2012-04-11 MED ORDER — WITCH HAZEL-GLYCERIN EX PADS: 1.0000 "application " | MEDICATED_PAD | CUTANEOUS | Status: DC | PRN

## 2012-04-11 MED ORDER — OXYTOCIN 10 UNIT/ML IJ SOLN
INTRAMUSCULAR | Status: AC
  Filled 2012-04-11: qty 4

## 2012-04-11 MED ORDER — LANOLIN HYDROUS EX OINT: 1.0000 "application " | TOPICAL_OINTMENT | CUTANEOUS | Status: DC | PRN

## 2012-04-11 MED ORDER — KETOROLAC TROMETHAMINE 30 MG/ML IJ SOLN
30.0000 mg | Freq: Four times a day (QID) | INTRAMUSCULAR | Status: AC | PRN
  Administered 2012-04-11 (×2): 30 mg via INTRAVENOUS
  Filled 2012-04-11: qty 1

## 2012-04-11 MED ORDER — MORPHINE SULFATE 0.5 MG/ML IJ SOLN
INTRAMUSCULAR | Status: AC
  Filled 2012-04-11: qty 10

## 2012-04-11 MED ORDER — SODIUM CHLORIDE 0.9 % IJ SOLN: 3.0000 mL | INTRAMUSCULAR | Status: DC | PRN

## 2012-04-11 MED ORDER — SCOPOLAMINE 1 MG/3DAYS TD PT72
1.0000 | MEDICATED_PATCH | Freq: Once | TRANSDERMAL | Status: DC
  Administered 2012-04-11: 1.5 mg via TRANSDERMAL

## 2012-04-11 MED ORDER — FENTANYL CITRATE 0.05 MG/ML IJ SOLN
25.0000 ug | INTRAMUSCULAR | Status: DC | PRN
  Administered 2012-04-11 (×2): 50 ug via INTRAVENOUS

## 2012-04-11 MED ORDER — FENTANYL CITRATE 0.05 MG/ML IJ SOLN
INTRAMUSCULAR | Status: DC | PRN
  Administered 2012-04-11: 75 ug via INTRAVENOUS

## 2012-04-11 MED ORDER — CHLOROPROCAINE HCL 3 % IJ SOLN
INTRAMUSCULAR | Status: DC | PRN
  Administered 2012-04-11: 20 mL

## 2012-04-11 MED ORDER — ONDANSETRON HCL 4 MG/2ML IJ SOLN
INTRAMUSCULAR | Status: DC | PRN
  Administered 2012-04-11: 4 mg via INTRAVENOUS

## 2012-04-11 MED ORDER — ZOLPIDEM TARTRATE 5 MG PO TABS: 5.0000 mg | ORAL_TABLET | Freq: Every evening | ORAL | Status: DC | PRN

## 2012-04-11 SURGICAL SUPPLY — 38 items
APL SKNCLS STERI-STRIP NONHPOA (GAUZE/BANDAGES/DRESSINGS) ×1
BENZOIN TINCTURE PRP APPL 2/3 (GAUZE/BANDAGES/DRESSINGS) ×2 IMPLANT
CHLORAPREP W/TINT 26ML (MISCELLANEOUS) ×2 IMPLANT
CLOTH BEACON ORANGE TIMEOUT ST (SAFETY) ×2 IMPLANT
CONTAINER PREFILL 10% NBF 15ML (MISCELLANEOUS) ×2 IMPLANT
DRESSING TELFA 8X3 (GAUZE/BANDAGES/DRESSINGS) ×1 IMPLANT
ELECT REM PT RETURN 9FT ADLT (ELECTROSURGICAL) ×2
ELECTRODE REM PT RTRN 9FT ADLT (ELECTROSURGICAL) ×1 IMPLANT
EXTRACTOR VACUUM M CUP 4 TUBE (SUCTIONS) IMPLANT
GLOVE BIO SURGEON STRL SZ7.5 (GLOVE) ×4 IMPLANT
GLOVE BIOGEL PI IND STRL 7.5 (GLOVE) ×1 IMPLANT
GLOVE BIOGEL PI INDICATOR 7.5 (GLOVE) ×1
GOWN PREVENTION PLUS LG XLONG (DISPOSABLE) ×6 IMPLANT
KIT ABG SYR 3ML LUER SLIP (SYRINGE) IMPLANT
NDL HYPO 25X5/8 SAFETYGLIDE (NEEDLE) IMPLANT
NEEDLE HYPO 22GX1.5 SAFETY (NEEDLE) IMPLANT
NEEDLE HYPO 25X5/8 SAFETYGLIDE (NEEDLE) IMPLANT
NS IRRIG 1000ML POUR BTL (IV SOLUTION) ×2 IMPLANT
PACK C SECTION WH (CUSTOM PROCEDURE TRAY) ×2 IMPLANT
PAD ABD 7.5X8 STRL (GAUZE/BANDAGES/DRESSINGS) ×1 IMPLANT
RETRACTOR WND ALEXIS 25 LRG (MISCELLANEOUS) ×1 IMPLANT
RTRCTR WOUND ALEXIS 25CM LRG (MISCELLANEOUS) ×2
SLEEVE SCD COMPRESS KNEE MED (MISCELLANEOUS) IMPLANT
SPONGE GAUZE 4X4 12PLY (GAUZE/BANDAGES/DRESSINGS) ×1 IMPLANT
STRIP CLOSURE SKIN 1/2X4 (GAUZE/BANDAGES/DRESSINGS) ×2 IMPLANT
SUT CHROMIC 2 0 CT 1 (SUTURE) ×2 IMPLANT
SUT MNCRL AB 3-0 PS2 27 (SUTURE) ×2 IMPLANT
SUT PLAIN 0 NONE (SUTURE) ×1 IMPLANT
SUT PLAIN 2 0 XLH (SUTURE) ×2 IMPLANT
SUT VIC AB 0 CT1 36 (SUTURE) ×2 IMPLANT
SUT VIC AB 0 CTX 36 (SUTURE) ×6
SUT VIC AB 0 CTX36XBRD ANBCTRL (SUTURE) ×3 IMPLANT
SYR CONTROL 10ML LL (SYRINGE) IMPLANT
TAPE CLOTH SURG 4X10 WHT LF (GAUZE/BANDAGES/DRESSINGS) ×1 IMPLANT
TOWEL OR 17X24 6PK STRL BLUE (TOWEL DISPOSABLE) ×4 IMPLANT
TRAY FOLEY BAG SILVER LF 14FR (CATHETERS) ×1 IMPLANT
TRAY FOLEY CATH 14FR (SET/KITS/TRAYS/PACK) ×1 IMPLANT
WATER STERILE IRR 1000ML POUR (IV SOLUTION) ×2 IMPLANT

## 2012-04-11 NOTE — Progress Notes (Signed)
Vertex, breech presentation, will discuss plan of care with attending physician.

## 2012-04-11 NOTE — Anesthesia Procedure Notes (Signed)
Spinal  Patient location during procedure: OR Start time: 04/11/2012 1:45 PM Staffing Anesthesiologist: Rudean Curt R Performed by: anesthesiologist  Preanesthetic Checklist Completed: patient identified, site marked, surgical consent, pre-op evaluation, timeout performed, IV checked, risks and benefits discussed and monitors and equipment checked Spinal Block Patient position: sitting Prep: DuraPrep Patient monitoring: heart rate, cardiac monitor, continuous pulse ox and blood pressure Approach: midline Location: L3-4 Injection technique: single-shot Needle Needle type: Sprotte  Needle gauge: 24 G Needle length: 9 cm Assessment Sensory level: T4 Additional Notes Patient identified.  Risk benefits discussed including failed block, incomplete pain control, headache, nerve damage, paralysis, blood pressure changes, nausea, vomiting, reactions to medication both toxic or allergic, and postpartum back pain.  Patient expressed understanding and wished to proceed.  All questions were answered.  Sterile technique used throughout procedure.  CSF was clear.  No parasthesia or other complications.  Please see nursing notes for vital signs.

## 2012-04-11 NOTE — Progress Notes (Signed)
Pt transferred to OR via bed for c-section

## 2012-04-11 NOTE — Progress Notes (Signed)
Monitors off, pt transported to ultrasound to confirm presentation.

## 2012-04-11 NOTE — H&P (Signed)
Erika David is a 36 y.o. female presenting for induction of labor, denies uc, srom, vag bleeding, with +FM, Erika David  36 y.o. 69w1dTwins and premature Cx change.  History OB History    Grav Para Term Preterm Abortions TAB SAB Ect Mult Living   6 3 3  2 1 1   3      Past Medical History  Diagnosis Date  . Chlamydia   . H/O gonorrhea     treated  . Trichomonas   . H/O candidiasis   . Pelvic pain 07/11/10  . Yeast infection   . H/O dysmenorrhea 03/13/02  . H/O menorrhagia 07/09/02  . Abnormal Pap smear      LEEP?  LAST PAP 05/2009  . Infection 2006    GONORHRREA  . Infection 2006    CHLAMYDIA  . Infection     FREQ YEAST  . Pelvic injury AS TEEN    SPORTS RELATED  . Anemia 01/04/2012   Past Surgical History  Procedure Date  . Gynecologic cryosurgery   . Wisdom tooth extraction 2007   Family History: family history includes Alcohol abuse in her maternal aunt; Asthma in her cousin; Cancer in her mother; Diabetes in her father and maternal grandmother; Drug abuse in her maternal aunt; Heart disease in her maternal grandfather; Hypertension in her maternal grandmother, mother, and sister; Liver disease in her sister; Lupus in her maternal aunt; Stroke in her maternal grandfather; and Thyroid disease in her mother.  There is no history of Anesthesia problems. Social History:  reports that she has never smoked. She has never used smokeless tobacco. She reports that she does not drink alcohol or use illicit drugs.   Prenatal Transfer Tool  Maternal Diabetes: No Genetic Screening: Declined Maternal Ultrasounds/Referrals: Normal Fetal Ultrasounds or other Referrals:  None Maternal Substance Abuse:  No Significant Maternal Medications:  None Significant Maternal Lab Results:  None Other Comments:  None  ROS    Blood pressure 132/82, pulse 72, temperature 98 F (36.7 C), temperature source Oral, resp. rate 18, height 5' 1.5" (1.562 m), weight 167 lb (75.751 kg), last  menstrual period 07/28/2011. Exam Physical Exam Calm, no distress, HEENT WNL,  lungs clear bilaterally, AP RRR, abd soft nt, not tympanic bowel sounds active, abdomen nontender, No edema to lower extremities  Prenatal labs: ABO, Rh: --/--/B POS (07/03 0335) Antibody: NEG (07/03 0335) Rubella: Immune (04/22 0000) RPR: NON REAC (05/13 1146)  HBsAg: Negative (04/22 0000)  HIV: Non-reactive (04/04 0000)  GBS: NEGATIVE (07/23 1744) USS Formal: Confirmed Baby A=Vx and Baby B = br   Assessment/Plan: 359w1dTwins Plans Primary C/S with BTL. Reviewed risks, bleeding, infection and injury to organs,Collaboration with Dr RoMancel Baley telephone.    Erika David 04/11/2012, 11:55 AM Patient found to have vtx/brech presentation and desires permanent sterilization.  I discussed options.  Pt wants to proceed with c-section.  R/B/A discussed.  Questions answered and consent s/w.

## 2012-04-11 NOTE — Op Note (Signed)
Cesarean Section Procedure Note  Indications: Twins with Malpresentation at 18 1/7wks  Pre-operative Diagnosis: twins, breech, desires sterilization   Post-operative Diagnosis: twins, breech, desires sterilization  Procedure: CESAREAN SECTION and BTL  Surgeon: Everett Graff, MD   Assistants: n/a  Anesthesia: Regional  Anesthesiologist: Rudean Curt, MD   Procedure Details  The patient was taken to the operating room after the risks, benefits, complications, treatment options, and expected outcomes were discussed with the patient.  The patient concurred with the proposed plan, giving informed consent which was signed and witnessed. The patient was taken to Operating Room C-Section Suite, identified as [employer] and the procedure verified as C-Section Delivery. A Time Out was held and the above information confirmed.  After induction of anesthesia by obtaining a surgical level via the spinal, the patient was prepped and draped in the usual sterile manner. A Pfannenstiel skin incision was made and carried down through the subcutaneous tissue to the underlying layer of fascia.  The fascia was incised bilaterally and extended transversely bilaterally with the Mayo scissors. Kocher clamps were placed on the inferior aspect of the fascial incision and the underlying rectus muscle was separated from the fascia. The same was done on the superior aspect of the fascial incision.  The peritoneum was identified, entered bluntly and extended manually. The utero-vesical peritoneal reflection was incised transversely and the bladder flap was bluntly freed from the lower uterine segment. A low transverse uterine incision was made with the scalpel and extended bilaterally with the bandage scissors.  The infant was delivered in vertex position without difficulty. After the umbilical cord was clamped and cut, the infant was handed to the awaiting pediatricians.  Cord blood was obtained for evaluation.  The  placenta was removed intact and appeared to be within normal limits. The uterus was cleared of all clots and debris. The uterine incision was closed with running interlocking sutures of 0 Vicryl and a second imbricating layer was performed as well.   Bilateral tubes and ovaries appeared to be within normal limits.  Good hemostasis was noted.  Copious irrigation was performed until clear.  The uterus was exteriorized and the left fallopian tube grasped in the midportion with a babcock after carrying it out to its fimbriated end and ligated with two 2-0 plain ties.  The tube was excised and the remaining pedicle cauterized with the bovie. The same was done on the contralateral side.  The peritoneum was repaired with 2-0 chromic via a running suture.  The fascia was reapproximated with a running suture of 0 Vicryl. The subcutaneous tissue was reapproximated with 3 interrupted sutures of 2-0 plain.  The skin was reapproximated with a subcuticular suture of 3-0 monocryl.  Steristrips were applied.  Instrument, sponge, and needle counts were correct prior to abdominal closure and at the conclusion of the case.  The patient was awaiting transfer to the recovery room in good condition.  Findings: Live female infant A with Apgars 8 at one minute and 8 at five minutes.  Live female infant A with Apgars 8 at one minute and 9 at five minutes.  Normal appearing bilateral ovaries and fallopian tubes were noted.  Estimated Blood Loss:  932m         Drains: foley to gravity (see flowsheet)         Total IV Fluids: 15042m        Specimens to Pathology: Placenta and Bilateral Portion of Tubes  Complications:  None; patient tolerated the procedure well.         Disposition: PACU - hemodynamically stable.         Condition: stable  Attending Attestation: I performed the procedure.

## 2012-04-11 NOTE — Progress Notes (Signed)
Notified of pt's ultrasound results (vertex, breech)

## 2012-04-11 NOTE — Progress Notes (Signed)
Patient had LR at 125 ml/hr infusing at shift change after 1900.  The previous RN reported that her lactated ringers containing 40 units of Pitocin had finished after she had arrived to mother baby.  There is an empty bag of Lactated Ringers with 40 units of pitocin in the trash can.  Dr. Mancel Bale notified and verbalizes that it is ok to keep the LR infusing at 125 ml/hr because the patient's bleeding has been normal.  Dr. Mancel Bale originally notified because C/S deliveries have an order to infuse LR with 40 units of Pitocin at 62.5 for 16 hours.  The patient is tolerating PO fluids well and her bleeding is WNL.

## 2012-04-11 NOTE — Anesthesia Postprocedure Evaluation (Signed)
Anesthesia Post Note  Patient: Erika David  Procedure(s) Performed: Procedure(s) (LRB): CESAREAN SECTION (N/A)  Anesthesia type: Spinal  Patient location: PACU  Post pain: Pain level controlled  Post assessment: Post-op Vital signs reviewed  Last Vitals:  Filed Vitals:   04/11/12 1530  BP: 120/81  Pulse: 62  Temp:   Resp: 19    Post vital signs: Reviewed  Level of consciousness: awake  Complications: No apparent anesthesia complications

## 2012-04-11 NOTE — Transfer of Care (Signed)
Immediate Anesthesia Transfer of Care Note  Patient: Erika David  Procedure(s) Performed: Procedure(s) (LRB): CESAREAN SECTION (N/A)  Patient Location: PACU  Anesthesia Type: Spinal  Level of Consciousness: awake, alert  and oriented  Airway & Oxygen Therapy: Patient Spontanous Breathing  Post-op Assessment: Report given to PACU RN and Post -op Vital signs reviewed and stable  Post vital signs: stable  Complications: No apparent anesthesia complications

## 2012-04-11 NOTE — Anesthesia Preprocedure Evaluation (Addendum)
Anesthesia Evaluation  Patient identified by MRN, date of birth, ID band Patient awake    Reviewed: Allergy & Precautions, H&P , NPO status , Patient's Chart, lab work & pertinent test results, reviewed documented beta blocker date and time   Airway Mallampati: II TM Distance: >3 FB Neck ROM: full   Comment: +prognath, >3FB TMD Dental No notable dental hx.    Pulmonary neg pulmonary ROS,  breath sounds clear to auscultation  Pulmonary exam normal       Cardiovascular negative cardio ROS  Rhythm:regular Rate:Normal     Neuro/Psych negative neurological ROS  negative psych ROS   GI/Hepatic negative GI ROS, Neg liver ROS,   Endo/Other  negative endocrine ROS  Renal/GU negative Renal ROS  negative genitourinary   Musculoskeletal   Abdominal Normal abdominal exam  (+)   Peds  Hematology  (+) anemia ,   Anesthesia Other Findings Twin gestation, obese  Reproductive/Obstetrics (+) Pregnancy (Twin gestation)                        Anesthesia Physical Anesthesia Plan  ASA: II  Anesthesia Plan: Spinal   Post-op Pain Management:    Induction:   Airway Management Planned:   Additional Equipment:   Intra-op Plan:   Post-operative Plan:   Informed Consent: I have reviewed the patients History and Physical, chart, labs and discussed the procedure including the risks, benefits and alternatives for the proposed anesthesia with the patient or authorized representative who has indicated his/her understanding and acceptance.     Plan Discussed with:   Anesthesia Plan Comments:         Anesthesia Quick Evaluation

## 2012-04-12 DIAGNOSIS — Z98891 History of uterine scar from previous surgery: Secondary | ICD-10-CM

## 2012-04-12 LAB — CBC
HCT: 28.6 % — ABNORMAL LOW (ref 36.0–46.0)
Hemoglobin: 9.9 g/dL — ABNORMAL LOW (ref 12.0–15.0)
MCH: 29.8 pg (ref 26.0–34.0)
MCHC: 34.6 g/dL (ref 30.0–36.0)
MCV: 86.1 fL (ref 78.0–100.0)
Platelets: 142 10*3/uL — ABNORMAL LOW (ref 150–400)
RBC: 3.32 MIL/uL — ABNORMAL LOW (ref 3.87–5.11)
RDW: 14 % (ref 11.5–15.5)
WBC: 10.1 10*3/uL (ref 4.0–10.5)

## 2012-04-12 NOTE — Progress Notes (Signed)
Subjective: Postpartum Day 1: Cesarean Delivery for Twins Patient reports incisional pain, tolerating PO and no problems voiding.  Ambulating ok.  Objective: Vital signs in last 24 hours: Temp:  [97.6 F (36.4 C)-98.7 F (37.1 C)] 97.8 F (36.6 C) (08/03 0950) Pulse Rate:  [58-77] 65  (08/03 0950) Resp:  [13-20] 18  (08/03 0950) BP: (110-142)/(61-95) 113/75 mmHg (08/03 0950) SpO2:  [93 %-100 %] 99 % (08/03 0950)  Physical Exam:  General: alert Lochia: appropriate Uterine Fundus: firm Incision: dressing intact DVT Evaluation: No evidence of DVT seen on physical exam.   Basename 04/12/12 0535 04/11/12 1812  HGB 9.9* 10.8*  HCT 28.6* 32.0*    Assessment/Plan: Status post Cesarean section. Doing well postoperatively.  Continue current care.  Cyrena Kuchenbecker Y 04/12/2012, 1:35 PM

## 2012-04-13 MED ORDER — GLYCERIN (LAXATIVE) 2.1 G RE SUPP
1.0000 | Freq: Every day | RECTAL | Status: DC | PRN
  Administered 2012-04-13: 1 via RECTAL
  Filled 2012-04-13: qty 1

## 2012-04-13 MED ORDER — BISACODYL 5 MG PO TBEC
10.0000 mg | DELAYED_RELEASE_TABLET | Freq: Every day | ORAL | Status: DC | PRN
  Administered 2012-04-13: 10 mg via ORAL
  Filled 2012-04-13: qty 2

## 2012-04-13 MED ORDER — HYDROCODONE-ACETAMINOPHEN 5-325 MG PO TABS
1.0000 | ORAL_TABLET | ORAL | Status: DC | PRN
  Administered 2012-04-13 – 2012-04-14 (×4): 2 via ORAL
  Administered 2012-04-14 (×2): 1 via ORAL
  Filled 2012-04-13: qty 2
  Filled 2012-04-13: qty 1
  Filled 2012-04-13 (×3): qty 2
  Filled 2012-04-13: qty 1

## 2012-04-13 NOTE — Progress Notes (Signed)
Subjective: Postpartum Day 2: Cesarean Delivery due to twins, vtx/breech Patient reports passing flatus, but still feels bloated.  Also reports Percocet not covering pain well. Feeding:  Breast, going well Contraceptive:  BTL with C/S  Objective: Vital signs in last 24 hours: Temp:  [97.8 F (36.6 C)-98.5 F (36.9 C)] 98.5 F (36.9 C) (08/04 0545) Pulse Rate:  [66-86] 86  (08/04 0545) Resp:  [18] 18  (08/04 0545) BP: (109-117)/(73-78) 117/78 mmHg (08/04 0545) SpO2:  [96 %] 96 % (08/03 1420)  Physical Exam:  General: alert Lochia: appropriate Uterine Fundus: firm Abdomen:  + bowel sounds in all quadrants, but tympanic gaseous distension noted. Incision: healing well DVT Evaluation: No evidence of DVT seen on physical exam. Negative Homan's sign. JP drain:   NA   Basename 04/12/12 0535 04/11/12 1812  HGB 9.9* 10.8*  HCT 28.6* 32.0*    Assessment/Plan: Status post Cesarean section for twins, day 2 Gaseous distension  Plan: Dulcolax tabs today--patient declines suppository Change po pain med to Vicodin--continue Motrin ATC. Anticipate d/c tomorrow.   Erika David, [place] 04/13/2012, 9:59 AM

## 2012-04-14 ENCOUNTER — Encounter (HOSPITAL_COMMUNITY): Payer: Self-pay | Admitting: Obstetrics and Gynecology

## 2012-04-14 MED ORDER — IBUPROFEN 600 MG PO TABS: 600.0000 mg | ORAL_TABLET | Freq: Four times a day (QID) | ORAL | Status: AC

## 2012-04-14 MED ORDER — HYDROCODONE-ACETAMINOPHEN 5-325 MG PO TABS: 1.0000 | ORAL_TABLET | ORAL | Status: AC | PRN

## 2012-04-14 NOTE — Progress Notes (Signed)
Ur chart review completed.  

## 2012-04-15 NOTE — Discharge Summary (Signed)
   Obstetric Discharge Summary Reason for Admission: induction of labor and twins, infants vtx/breech, primary c/s Prenatal Procedures: NST and ultrasound Intrapartum Procedures: cesarean: low cervical, transverse and tubal ligation Postpartum Procedures: none Complications-Operative and Postpartum: none  Temp:  [97.8 F (36.6 C)] 97.8 F (36.6 C) (08/05 0650) Pulse Rate:  [72] 72  (08/05 0650) Resp:  [18] 18  (08/05 0650) BP: (107)/(68) 107/68 mmHg (08/05 0650) SpO2:  [98 %] 98 % (08/05 0650) Hemoglobin  Date Value Range Status  04/12/2012 9.9* 12.0 - 15.0 g/dL Final     HCT  Date Value Range Status  04/12/2012 28.6* 36.0 - 46.0 % Final    Hospital Course:  Hospital Course: Admitted for IOL at [redacted]w[redacted]d Twins - vtx/breech presentation, so an elective primary LTCS, was performed, w BTL, neg GBS. Delivery was performed by Dr RMancel Balewithout difficulty. Patient and babies tolerated the procedure without difficulty. Both Infants to FTN. Mother and infant then had an uncomplicated postpartum course, with breast  feeding going well. Mom's physical exam was WNL, and she was discharged home in stable condition. Contraception plan was BTL performed.  She received adequate benefit from po pain medications.  Discharge Diagnoses: Term Pregnancy-delivered and s/p BTL,   Discharge Information: Date: 04/15/2012 Activity: pelvic rest Diet: routine Medications:  Medication List  As of 04/15/2012  4:15 AM   START taking these medications         HYDROcodone-acetaminophen 5-325 MG per tablet   Commonly known as: NORCO/VICODIN   Take 1-2 tablets by mouth every 4 (four) hours as needed.      ibuprofen 600 MG tablet   Commonly known as: ADVIL,MOTRIN   Take 1 tablet (600 mg total) by mouth every 6 (six) hours.         CONTINUE taking these medications         prenatal multivitamin Tabs          Where to get your medications    These are the prescriptions that you need to pick up. We sent them  to a specific pharmacy, so you will need to go there to get them.   [id number]- [place]   [address] [id number]   Phone: [phone]       ibuprofen 600 MG tablet         You may get these medications from any pharmacy.         HYDROcodone-acetaminophen 5-325 MG per tablet           Condition: stable Instructions: refer to practice specific booklet Discharge to: home Follow-up Information    Follow up with [facility]  in 6 weeks. (Call to make a postpartum visit for 5-6 weeks)          Newborn Data: Live born  Information for the patient's newborn:  PSeham, [place][[[id number]] female Information for the patient's newborn:  PLaurielle, Selmon[[[id number]] female ; APGAR ,- BOY 8,8 wgt 6#3oz Girl - 8, 957wgt 5#6oz     Home with mother.  [place] Jr M 04/15/2012, 4:15 AM

## 2012-05-20 ENCOUNTER — Ambulatory Visit (INDEPENDENT_AMBULATORY_CARE_PROVIDER_SITE_OTHER): Payer: Medicaid Other | Admitting: Obstetrics and Gynecology

## 2012-05-20 ENCOUNTER — Encounter: Payer: Self-pay | Admitting: Obstetrics and Gynecology

## 2012-05-20 NOTE — Progress Notes (Signed)
S: comfortable      Bleeding stopped and has had a period     Bottle feeding, did breastfeed x 1 month O VSS     abd soft, nontender     Diastasis recti 2 finger breath     Normal hair distrubition mons pubis,      EGBUS and perineum WNL,  vagina  pink, moist normal rugae, good vaginal tone cerix LTC, no cervical motion tenderness, uterus firm small Incision well approximated no redness or drainage with 3 1/4 cm size areas slightly raised.     A normal involution     Lactating     6 weeks PP cesarean section and BTL P f/o up annual and prn    F/o up PAP 01/2013 Hetty Ely, CNM

## 2012-05-20 NOTE — Progress Notes (Signed)
Date of delivery: 04/11/2012 FEMALE/FEMALE TWINS  Name: [name] &  [name] Vaginal delivery:no Cesarean section:yes Tubal ligation:yes GDM:no Breast Feeding:no Bottle Feeding:yes Post-Partum Blues:no Abnormal pap:no Normal GU function: yes Normal GI function:yes Returning to work:yes EPDS: 2

## 2012-05-22 ENCOUNTER — Encounter: Payer: Self-pay | Admitting: Obstetrics and Gynecology

## 2012-05-23 ENCOUNTER — Encounter: Payer: Self-pay | Admitting: Obstetrics and Gynecology

## 2012-06-25 ENCOUNTER — Other Ambulatory Visit: Payer: Self-pay | Admitting: Family Medicine

## 2012-06-25 DIAGNOSIS — Z1231 Encounter for screening mammogram for malignant neoplasm of breast: Secondary | ICD-10-CM

## 2012-08-28 ENCOUNTER — Telehealth: Payer: Self-pay | Admitting: Obstetrics and Gynecology

## 2012-08-28 NOTE — Telephone Encounter (Signed)
VM from [facility] 08/27/12.  Need alternate for Rx. Insurance does not cover.  [phone]

## 2012-09-04 NOTE — Telephone Encounter (Signed)
TC to pharmacy.  NO record of clarification needed. F/U ended.

## 2012-10-13 ENCOUNTER — Ambulatory Visit: Payer: Medicaid Other

## 2012-11-18 ENCOUNTER — Telehealth: Payer: Self-pay | Admitting: Obstetrics and Gynecology

## 2012-11-18 NOTE — Telephone Encounter (Signed)
Returned pt's call. States is having white vaginal D/C with itching x 3 days. Requests RX/ Informed may use OTC med or sched appt. PT sched with EP 11/19/12.

## 2012-11-19 ENCOUNTER — Encounter: Payer: Self-pay | Admitting: Obstetrics and Gynecology

## 2012-11-19 ENCOUNTER — Ambulatory Visit: Payer: Managed Care, Other (non HMO) | Admitting: Obstetrics and Gynecology

## 2012-11-19 VITALS — BP 112/72 | Temp 98.4°F | Wt 172.0 lb

## 2012-11-19 DIAGNOSIS — B354 Tinea corporis: Secondary | ICD-10-CM

## 2012-11-19 DIAGNOSIS — G5603 Carpal tunnel syndrome, bilateral upper limbs: Secondary | ICD-10-CM

## 2012-11-19 DIAGNOSIS — B9689 Other specified bacterial agents as the cause of diseases classified elsewhere: Secondary | ICD-10-CM

## 2012-11-19 DIAGNOSIS — IMO0001 Reserved for inherently not codable concepts without codable children: Secondary | ICD-10-CM

## 2012-11-19 DIAGNOSIS — Z1321 Encounter for screening for nutritional disorder: Secondary | ICD-10-CM

## 2012-11-19 DIAGNOSIS — Z113 Encounter for screening for infections with a predominantly sexual mode of transmission: Secondary | ICD-10-CM

## 2012-11-19 DIAGNOSIS — N898 Other specified noninflammatory disorders of vagina: Secondary | ICD-10-CM

## 2012-11-19 LAB — POCT WET PREP (WET MOUNT)
Whiff Test: 5
pH: POSITIVE

## 2012-11-19 LAB — POCT URINE PREGNANCY: Preg Test, Ur: NEGATIVE

## 2012-11-19 MED ORDER — NYSTATIN-TRIAMCINOLONE 100000-0.1 UNIT/GM-% EX OINT: TOPICAL_OINTMENT | CUTANEOUS | Status: DC

## 2012-11-19 MED ORDER — FLUCONAZOLE 150 MG PO TABS: 150.0000 mg | ORAL_TABLET | Freq: Once | ORAL | Status: DC

## 2012-11-19 MED ORDER — METRONIDAZOLE 500 MG PO TABS: 500.0000 mg | ORAL_TABLET | Freq: Two times a day (BID) | ORAL | Status: DC

## 2012-11-19 NOTE — Progress Notes (Signed)
37 YO complains of recurrent yeast symptoms, recently for 4 days with no response with Monistat.  Also complains of bilateral wrist pain with numbness (mostlly on the right) when she uses it a lot.  Pain will last for days but has a wrist support that helps. Had the same problems during recent pregnancy and has been worse since then-delivered August 2013.  Lastly requesting medication for "yeast" under her breast that she gets from time to time.   O: BP 112/72  Temp(Src) 98.4 F (36.9 C) (Oral)  Wt 172 lb (78.019 kg)  BMI 31.98 kg/m2  LMP 10/28/2012  Extremities:  + Phalen's sign Pelvic: Abdomen: soft, non-tender without masses             Pelvic: EGBUS-wnl, vagina-normal with moderate grey discharge, cervix-no lesions and without tenderness, uterus-normal size and adnexae-no                       tenderness or masses  Wet Prep: ph-5.5, whiff-positive, many cluecells RPT-negative  A: Bacterial Vaginosis     Carpal Tunnel Syndrome     Tinea Corporis History  P: Refer to [employer] for evaluation/management of carpal tunnel syndrome       Mycolog II Cream #30 grams apply to affected area (under breast) bid x 7-14 days prn; Once cleared, use      Zeasorb for prevention       Perineal hygiene       STD Testing      Metronidazole 500 mg bid x 7 days     Diflucan 150 mg #1 1 po stat      Vitamin D 25-H-pending      RTO-as scheduled or prn  Tineka Uriegas, PA-C

## 2012-11-19 NOTE — Progress Notes (Signed)
Vaginal discharge: mucoidthick mucoid Itching / Burning: yes Fever: no  Symptoms have been present for 4 days. Pt states she keep having recurrent d/c Has used over-the-counter treatment: yes monistat 3 day Associated symptoms:  Pelvic pain: no       Dyspareunia: no     Odor:  yes  History of STD:  history of chlamydia STD screen:requested

## 2012-11-19 NOTE — Patient Instructions (Addendum)
May use Zeasorb Powder to prevent yeast on chest skin   Avoid: - excess soap on genital area (consider using plain oatmeal soap) - use of powder or sprays in genital area - douching - wearing underwear to bed (except with menses) - using more than is directed detergent when washing clothes - tight fitting garments around genital area - excess sugar intake

## 2012-11-20 LAB — GC/CHLAMYDIA PROBE AMP
CT Probe RNA: NEGATIVE
GC Probe RNA: NEGATIVE

## 2012-11-20 LAB — HEPATITIS C ANTIBODY: HCV Ab: NEGATIVE

## 2012-11-20 LAB — HIV ANTIBODY (ROUTINE TESTING W REFLEX): HIV: NONREACTIVE

## 2012-11-20 LAB — RPR

## 2012-11-20 LAB — VITAMIN D 25 HYDROXY (VIT D DEFICIENCY, FRACTURES): Vit D, 25-Hydroxy: 14 ng/mL — ABNORMAL LOW (ref 30–89)

## 2012-11-20 LAB — HEPATITIS B SURFACE ANTIGEN: Hepatitis B Surface Ag: NEGATIVE

## 2012-11-20 LAB — HSV 2 ANTIBODY, IGG: HSV 2 Glycoprotein G Ab, IgG: 7.22 IV — ABNORMAL HIGH

## 2012-11-25 ENCOUNTER — Telehealth: Payer: Self-pay

## 2012-11-25 NOTE — Telephone Encounter (Signed)
Message copied by Ilean China on Tue Nov 25, 2012 11:18 AM ------      Message from: Earnstine Regal      Created: Fri Nov 21, 2012  8:49 AM       Please perform Vitamin D Protocol and inform patient of positive HSV-2.  Regarding HSV-2, she may come in to discuss or review websites:  [email or web address]  and   [email or web address]    for more information.  Thanks,  EP ------

## 2012-11-25 NOTE — Telephone Encounter (Signed)
Spoke with pt rgd labs informed lab results informed rx called to lab pt voice understanding rx called to pharm for vit d 50,000 units one capsule 2xs weekly for 8 weeks

## 2012-12-02 ENCOUNTER — Telehealth: Payer: Self-pay

## 2012-12-02 NOTE — Telephone Encounter (Signed)
done

## 2013-05-29 ENCOUNTER — Ambulatory Visit (INDEPENDENT_AMBULATORY_CARE_PROVIDER_SITE_OTHER): Payer: Managed Care, Other (non HMO) | Admitting: Family Medicine

## 2013-05-29 VITALS — BP 120/80 | HR 73 | Temp 98.0°F | Resp 16 | Ht 61.0 in | Wt 172.0 lb

## 2013-05-29 DIAGNOSIS — N898 Other specified noninflammatory disorders of vagina: Secondary | ICD-10-CM

## 2013-05-29 DIAGNOSIS — D649 Anemia, unspecified: Secondary | ICD-10-CM

## 2013-05-29 DIAGNOSIS — Z0000 Encounter for general adult medical examination without abnormal findings: Secondary | ICD-10-CM

## 2013-05-29 DIAGNOSIS — Z13220 Encounter for screening for lipoid disorders: Secondary | ICD-10-CM

## 2013-05-29 DIAGNOSIS — E559 Vitamin D deficiency, unspecified: Secondary | ICD-10-CM

## 2013-05-29 DIAGNOSIS — Z23 Encounter for immunization: Secondary | ICD-10-CM

## 2013-05-29 LAB — COMPREHENSIVE METABOLIC PANEL
ALT: 11 U/L (ref 0–35)
AST: 13 U/L (ref 0–37)
Albumin: 4.6 g/dL (ref 3.5–5.2)
Alkaline Phosphatase: 94 U/L (ref 39–117)
BUN: 12 mg/dL (ref 6–23)
CO2: 26 mEq/L (ref 19–32)
Calcium: 9.4 mg/dL (ref 8.4–10.5)
Chloride: 104 mEq/L (ref 96–112)
Creat: 0.89 mg/dL (ref 0.50–1.10)
Glucose, Bld: 73 mg/dL (ref 70–99)
Potassium: 4.2 mEq/L (ref 3.5–5.3)
Sodium: 138 mEq/L (ref 135–145)
Total Bilirubin: 0.3 mg/dL (ref 0.3–1.2)
Total Protein: 7.4 g/dL (ref 6.0–8.3)

## 2013-05-29 LAB — POCT CBC
Granulocyte percent: 48.7 %G (ref 37–80)
HCT, POC: 39.8 % (ref 37.7–47.9)
Hemoglobin: 12.7 g/dL (ref 12.2–16.2)
Lymph, poc: 2 (ref 0.6–3.4)
MCH, POC: 28.5 pg (ref 27–31.2)
MCHC: 31.9 g/dL (ref 31.8–35.4)
MCV: 89.3 fL (ref 80–97)
MID (cbc): 0.4 (ref 0–0.9)
MPV: 11.8 fL (ref 0–99.8)
POC Granulocyte: 2.3 (ref 2–6.9)
POC LYMPH PERCENT: 42.1 %L (ref 10–50)
POC MID %: 9.2 %M (ref 0–12)
Platelet Count, POC: 227 10*3/uL (ref 142–424)
RBC: 4.46 M/uL (ref 4.04–5.48)
RDW, POC: 13.6 %
WBC: 4.7 10*3/uL (ref 4.6–10.2)

## 2013-05-29 LAB — LDL CHOLESTEROL, DIRECT: Direct LDL: 126 mg/dL — ABNORMAL HIGH

## 2013-05-29 LAB — POCT WET PREP WITH KOH
KOH Prep POC: NEGATIVE
Trichomonas, UA: NEGATIVE
Yeast Wet Prep HPF POC: NEGATIVE

## 2013-05-29 LAB — POCT UA - MICROSCOPIC ONLY
Bacteria, U Microscopic: NEGATIVE
Casts, Ur, LPF, POC: NEGATIVE
Crystals, Ur, HPF, POC: NEGATIVE
Yeast, UA: NEGATIVE

## 2013-05-29 LAB — POCT URINALYSIS DIPSTICK
Bilirubin, UA: NEGATIVE
Blood, UA: NEGATIVE
Glucose, UA: NEGATIVE
Ketones, UA: NEGATIVE
Leukocytes, UA: NEGATIVE
Nitrite, UA: NEGATIVE
Protein, UA: NEGATIVE
Spec Grav, UA: 1.02
Urobilinogen, UA: 0.2
pH, UA: 5.5

## 2013-05-29 NOTE — Progress Notes (Signed)
[facility]:  Office Visit  Chief Complaint:  Chief Complaint  Patient presents with  . Employment Physical    HPI: Erika David is a 37 y.o. female who complains of: Here for pre-employment PE for [employer] Last pap was in 12/2011---normal Blood work was also done-STD testing G7L5, has TBL Would like flu vaccine Remote history of abnormal pap, Last Pap 2013 results below.  She does have a history of anemia and this may be due to child birth, she has twins in 2013. She also had a low vit D but was never on anything for this. Vit D was 14 about six mos ago.   Specimen adequacy: Comments: SATISFACTORY. Endocervical/transformation zone component present. FINAL DIAGNOSIS: Comments: - NEGATIVE FOR INTRAEPITHELIAL LESIONS OR MALIGNANCY. COMMENTS: Comments: LMP (Last Menstrual Period) of the patient is not given. Cytotechnologist: Comments: Donneta Romberg; BA, CT(ASCP)      Past Medical History  Diagnosis Date  . Chlamydia   . H/O gonorrhea     treated  . Trichomonas   . H/O candidiasis   . Pelvic pain 07/11/10  . Yeast infection   . H/O dysmenorrhea 03/13/02  . H/O menorrhagia 07/09/02  . Abnormal Pap smear      LEEP?  LAST PAP 05/2009  . Infection 2006    GONORHRREA  . Infection 2006    CHLAMYDIA  . Infection     FREQ YEAST  . Pelvic injury AS TEEN    SPORTS RELATED  . Anemia 01/04/2012   Past Surgical History  Procedure Laterality Date  . Gynecologic cryosurgery    . Wisdom tooth extraction  2007  . Cesarean section  04/11/2012    Procedure: CESAREAN SECTION;  Surgeon: Delice Lesch, MD;  Location: [facility];  Service: Gynecology;  Laterality: N/A;  twins  . Tubal ligation     History   Social History  . Marital Status: Single    Spouse Name: N/A    Number of Children: 3  . Years of Education: 16   Occupational History  . CNA    Social History Main Topics  . Smoking status: Never Smoker   . Smokeless tobacco: Never  Used  . Alcohol Use: No     Comment: occasional alcohol  . Drug Use: No  . Sexual Activity: Yes    Birth Control/ Protection: Surgical     Comment: btl   Other Topics Concern  . None   Social History Narrative  . None   Family History  Problem Relation Age of Onset  . Anesthesia problems Neg Hx   . Hypertension Mother   . Cancer Mother     BREAST  . Thyroid disease Mother   . Diabetes Father   . Hypertension Sister   . Hypertension Maternal Grandmother   . Diabetes Maternal Grandmother   . Heart disease Maternal Grandfather   . Stroke Maternal Grandfather   . Lupus Maternal Aunt   . Liver disease Sister   . Asthma Cousin   . Alcohol abuse Maternal Aunt   . Drug abuse Maternal Aunt    Allergies  Allergen Reactions  . Dust Mite Extract Anaphylaxis  . Kiwi Extract Itching and Swelling  . Pollen Extract Anaphylaxis    Tree pollen in environment   Prior to Admission medications   Medication Sig Start Date End Date Taking? Authorizing Provider  fluconazole (DIFLUCAN) 150 MG tablet Take 1 tablet (150 mg total) by mouth once. 11/19/12   [place]  Florene Glen, PA-C  metroNIDAZOLE (FLAGYL) 500 MG tablet Take 1 tablet (500 mg total) by mouth 2 (two) times daily. 11/19/12   Earnstine Regal, PA-C  nystatin-triamcinolone ointment (MYCOLOG) Apply to affected area bid x 7-14 days prn 11/19/12   Earnstine Regal, PA-C  Prenatal Vit-Fe Fumarate-FA (PRENATAL MULTIVITAMIN) TABS Take 1 tablet by mouth daily.    Historical Provider, MD     ROS: The patient denies fevers, chills, night sweats, unintentional weight loss, chest pain, palpitations, wheezing, dyspnea on exertion, nausea, vomiting, abdominal pain, dysuria, hematuria, melena, numbness, weakness, or tingling.   All other systems have been reviewed and were otherwise negative with the exception of those mentioned in the HPI and as above.    PHYSICAL EXAM: Filed Vitals:   05/29/13 1218  BP: 120/80  Pulse: 73  Temp: 98 F (36.7 C)  Resp:  16   Filed Vitals:   05/29/13 1218  Height: 5' 1"  (1.549 m)  Weight: 172 lb (78.019 kg)   Body mass index is 32.52 kg/(m^2).  General: Alert, no acute distress HEENT:  Normocephalic, atraumatic, oropharynx patent. EOMI, PERRLA, TM nl, no exudates Cardiovascular:  Regular rate and rhythm, no rubs murmurs or gallops.  No Carotid bruits, radial pulse intact. No pedal edema.  Respiratory: Clear to auscultation bilaterally.  No wheezes, rales, or rhonchi.  No cyanosis, no use of accessory musculature GI: No organomegaly, abdomen is soft and non-tender, positive bowel sounds.  No masses. Skin: No rashes. Neurologic: Facial musculature symmetric. Psychiatric: Patient is appropriate throughout our interaction. Lymphatic: No cervical lymphadenopathy Musculoskeletal: Gait intact. 5/5 strength, 2/2 DTRS, no scoliosis.  Breast exam nl GU exam-vaginal dc, no rashes, lesions, no odor. No CMT   LABS: Results for orders placed in visit on 05/29/13  POCT CBC      Result Value Range   WBC 4.7  4.6 - 10.2 K/uL   Lymph, poc 2.0  0.6 - 3.4   POC LYMPH PERCENT 42.1  10 - 50 %L   MID (cbc) 0.4  0 - 0.9   POC MID % 9.2  0 - 12 %M   POC Granulocyte 2.3  2 - 6.9   Granulocyte percent 48.7  37 - 80 %G   RBC 4.46  4.04 - 5.48 M/uL   Hemoglobin 12.7  12.2 - 16.2 g/dL   HCT, POC 39.8  37.7 - 47.9 %   MCV 89.3  80 - 97 fL   MCH, POC 28.5  27 - 31.2 pg   MCHC 31.9  31.8 - 35.4 g/dL   RDW, POC 13.6     Platelet Count, POC 227  142 - 424 K/uL   MPV 11.8  0 - 99.8 fL  POCT UA - MICROSCOPIC ONLY      Result Value Range   WBC, Ur, HPF, POC 0-2     RBC, urine, microscopic 0-1     Bacteria, U Microscopic neg     Mucus, UA moderate     Epithelial cells, urine per micros 1-3     Crystals, Ur, HPF, POC neg     Casts, Ur, LPF, POC neg     Yeast, UA neg    POCT URINALYSIS DIPSTICK      Result Value Range   Color, UA yellow     Clarity, UA clear     Glucose, UA neg     Bilirubin, UA neg     Ketones, UA  neg     Spec Grav, UA 1.020  Blood, UA neg     pH, UA 5.5     Protein, UA neg     Urobilinogen, UA 0.2     Nitrite, UA neg     Leukocytes, UA Negative    POCT WET PREP WITH KOH      Result Value Range   Trichomonas, UA Negative     Clue Cells Wet Prep HPF POC 0-5     Epithelial Wet Prep HPF POC 2-10     Yeast Wet Prep HPF POC neg     Bacteria Wet Prep HPF POC 3+     RBC Wet Prep HPF POC 0-1     WBC Wet Prep HPF POC tntc     KOH Prep POC Negative       EKG/XRAY:   Primary read interpreted by Dr. Marin Comment at [facility].   ASSESSMENT/PLAN: Encounter Diagnoses  Name Primary?  . Screening for hyperlipidemia   . Annual physical exam Yes  . Unspecified vitamin D deficiency   . Vaginal discharge   . Flu vaccine need   . Anemia    Labs pending Note for work given TB screening was negative No pap needed since last pap in 2013 normal F/u prn or in 1 year Gross sideeffects, risk and benefits, and alternatives of medications d/w patient. Patient is aware that all medications have potential sideeffects and we are unable to predict every sideeffect or drug-drug interaction that may occur.  Sanye Ledesma, [place], DO 05/29/2013 2:25 PM

## 2013-05-30 LAB — VITAMIN D 25 HYDROXY (VIT D DEFICIENCY, FRACTURES): Vit D, 25-Hydroxy: 27 ng/mL — ABNORMAL LOW (ref 30–89)

## 2013-05-30 LAB — TSH: TSH: 2.198 u[IU]/mL (ref 0.350–4.500)

## 2014-04-16 ENCOUNTER — Encounter: Payer: Medicaid Other | Admitting: Family Medicine

## 2016-05-11 ENCOUNTER — Ambulatory Visit (INDEPENDENT_AMBULATORY_CARE_PROVIDER_SITE_OTHER): Payer: Managed Care, Other (non HMO) | Admitting: Family Medicine

## 2016-05-11 ENCOUNTER — Other Ambulatory Visit (HOSPITAL_COMMUNITY)
Admission: RE | Admit: 2016-05-11 | Discharge: 2016-05-11 | Disposition: A | Discharge status: Home / Self Care | Payer: Managed Care, Other (non HMO) | Source: Ambulatory Visit | Attending: Family Medicine | Admitting: Family Medicine

## 2016-05-11 ENCOUNTER — Encounter: Payer: Self-pay | Admitting: Family Medicine

## 2016-05-11 VITALS — BP 123/78 | HR 73 | Temp 98.3°F | Ht 61.0 in | Wt 179.6 lb

## 2016-05-11 DIAGNOSIS — Z113 Encounter for screening for infections with a predominantly sexual mode of transmission: Secondary | ICD-10-CM | POA: Diagnosis not present

## 2016-05-11 DIAGNOSIS — Z0000 Encounter for general adult medical examination without abnormal findings: Secondary | ICD-10-CM | POA: Diagnosis not present

## 2016-05-11 DIAGNOSIS — D649 Anemia, unspecified: Secondary | ICD-10-CM | POA: Diagnosis not present

## 2016-05-11 DIAGNOSIS — N898 Other specified noninflammatory disorders of vagina: Secondary | ICD-10-CM

## 2016-05-11 LAB — CBC
HCT: 32.8 % — ABNORMAL LOW (ref 35.0–45.0)
Hemoglobin: 10.4 g/dL — ABNORMAL LOW (ref 11.7–15.5)
MCH: 24.6 pg — ABNORMAL LOW (ref 27.0–33.0)
MCHC: 31.7 g/dL — ABNORMAL LOW (ref 32.0–36.0)
MCV: 77.5 fL — ABNORMAL LOW (ref 80.0–100.0)
MPV: 10.9 fL (ref 7.5–12.5)
Platelets: 285 10*3/uL (ref 140–400)
RBC: 4.23 MIL/uL (ref 3.80–5.10)
RDW: 16.8 % — ABNORMAL HIGH (ref 11.0–15.0)
WBC: 5.9 10*3/uL (ref 3.8–10.8)

## 2016-05-11 LAB — POCT WET PREP (WET MOUNT)
Clue Cells Wet Prep Whiff POC: NEGATIVE
Trichomonas Wet Prep HPF POC: ABSENT

## 2016-05-11 NOTE — Progress Notes (Signed)
Subjective:  Chief complaint -- Annual Physical  Acute Concerns:   Vaginal Discharge:  Patient states that she has had vaginal discharge for the last week The discharge has been clear with egg white consistency Positive vaginal odor and itching Patient feels like her "pH balance is off " Denies any pelvic pain or fever Is sexually active with one partner who is the father of her children Does not use condoms Last menstrual period was August 17 patient has had a bilateral tubal ligation in 2013 Patient admits that she has some sensitivities to soaps and can only wash her vaginal area with Dove soap She has had yeast infections before and she feels like this may be one of them  Screening: Mammogram: Patient had a mammogram in April 2016 which was negative. Her mother was diagnosed with breast cancer at the age of 37 so she gets yearly mammograms. Menopause: no  Vaginal Bleeding: no Pap smear: Last Pap smear was April 2013; negative. Is due for Pap smear  Tobacco Use: no   Alcohol Use: yes, socially  Other Drugs: no   Support Structure: yes  Life at Home: No concerns. Patient has 5 children.   ROS- Positive vaginal discharge and itching Review of Systems  All other systems reviewed and are negative.   Past Medical History Patient Active Problem List   Diagnosis Date Noted  . Vaginal discharge 05/14/2016  . Well adult on routine health check 05/14/2016  . S/P cesarean section 04/12/2012  . Sterilization 01/04/2012  . Overweight(278.02) 01/04/2012  . Anemia 01/04/2012  . Seasonal allergies 01/04/2012    Medications- patient unsure which she is taking   Objective: BP 123/78   Pulse 73   Temp 98.3 F (36.8 C) (Oral)   Ht 5' 1"  (1.549 m)   Wt 179 lb 9.6 oz (81.5 kg)   LMP 04/26/2016   BMI 33.94 kg/m  Gen: NAD, alert, cooperative with exam HEENT: NCAT, EOMI, PERRL CV: RRR, good S1/S2, no murmur Resp: CTABL, no wheezes, non-labored Abd: Soft, Non Tender, Non  Distended, BS present, no guarding or organomegaly Ext: No edema, warm Neuro: Alert and oriented, No gross deficits GYN:  External genitalia within normal limits.  Vaginal mucosa pink, moist, normal rugae.  Nonfriable cervix without lesions, no discharge or bleeding noted on speculum exam.  Bimanual exam revealed normal, nongravid uterus. No cervical motion tenderness. No adnexal masses bilaterally.    CBC    Component Value Date/Time   WBC 5.9 05/11/2016 1646   RBC 4.23 05/11/2016 1646   HGB 10.4 (L) 05/11/2016 1646   HCT 32.8 (L) 05/11/2016 1646   PLT 285 05/11/2016 1646   MCV 77.5 (L) 05/11/2016 1646   MCV 89.3 05/29/2013 1412   MCH 24.6 (L) 05/11/2016 1646   MCHC 31.7 (L) 05/11/2016 1646   RDW 16.8 (H) 05/11/2016 1646   LYMPHSABS 0.7 11/20/2011 0908   MONOABS 0.4 11/20/2011 0908   EOSABS 0.1 11/20/2011 0908   BASOSABS 0.0 11/20/2011 0908     Assessment/Plan:  Vaginal discharge Gynecologic exam within normal limits. Wet prep/GC Chlam/Pap smear performed today. Will follow up with results.  Well adult on routine health check Physical exam normal today. Patient gets yearly mammograms due to strong family hx. Given instructions for scheduling next mammogram. Pap performed today. Patient unsure which medications she is taking, she will go home and look at the bottles and call the clinic back.   Anemia Routine CBC showing some mild microcytic anemia (hgb 10.4 and  MCV 77.5). Possibly due to iron deficiency, will call patient with results and discuss.   Orders Placed This Encounter  Procedures  . CBC    Standing Status:   Future    Number of Occurrences:   1    Standing Expiration Date:   05/11/2017  . POCT Wet Prep [facility])    No orders of the defined types were placed in this encounter.    Smitty Cords, MD [place], PGY-2

## 2016-05-11 NOTE — Patient Instructions (Addendum)
Thank you for coming in today, it was so nice to see you! Today we talked about:    Anemia: I have placed an order to check your blood.  Call the clinic and let us know which medications you are on when you find the names on the bottles so we can update your chart.  Please take a multivitamin daily  Please follow up in 1 year for your next wellness visit.   Bring in all your medications or supplements to each appointment for review.   If we ordered any tests today, you will be notified via telephone of any abnormalities. If everything is normal you will get a letter in the mail.   If you have any questions or concerns, please do not hesitate to call the office at [phone]. You can also message me directly via MyChart.   Sincerely,  Smitty Cords, MD

## 2016-05-14 DIAGNOSIS — Z0000 Encounter for general adult medical examination without abnormal findings: Secondary | ICD-10-CM | POA: Insufficient documentation

## 2016-05-14 DIAGNOSIS — N898 Other specified noninflammatory disorders of vagina: Secondary | ICD-10-CM | POA: Insufficient documentation

## 2016-05-14 NOTE — Assessment & Plan Note (Signed)
Gynecologic exam within normal limits. Wet prep/GC Chlam/Pap smear performed today. Will follow up with results.

## 2016-05-14 NOTE — Assessment & Plan Note (Signed)
Routine CBC showing some mild microcytic anemia (hgb 10.4 and MCV 77.5). Possibly due to iron deficiency, will call patient with results and discuss.

## 2016-05-14 NOTE — Assessment & Plan Note (Signed)
>>  ASSESSMENT AND PLAN FOR ANEMIA WRITTEN ON 05/14/2016  2:27 PM BY Beaulah Dinning, MD  Routine CBC showing some mild microcytic anemia (hgb 10.4 and MCV 77.5). Possibly due to iron deficiency, will call patient with results and discuss.

## 2016-05-14 NOTE — Assessment & Plan Note (Signed)
Physical exam normal today. Patient gets yearly mammograms due to strong family hx. Given instructions for scheduling next mammogram. Pap performed today. Patient unsure which medications she is taking, she will go home and look at the bottles and call the clinic back.

## 2016-05-16 ENCOUNTER — Encounter: Payer: Self-pay | Admitting: Family Medicine

## 2016-05-16 LAB — CERVICOVAGINAL ANCILLARY ONLY
Chlamydia: NEGATIVE
Neisseria Gonorrhea: NEGATIVE

## 2016-05-16 NOTE — Progress Notes (Signed)
Called patient to discuss lab results for mild anemia. No answer. Will send letter.

## 2016-06-25 ENCOUNTER — Emergency Department (HOSPITAL_COMMUNITY)
Admission: EM | Admit: 2016-06-25 | Discharge: 2016-06-25 | Disposition: A | Discharge status: Home / Self Care | Payer: Managed Care, Other (non HMO) | Attending: Emergency Medicine | Admitting: Emergency Medicine

## 2016-06-25 ENCOUNTER — Encounter (HOSPITAL_COMMUNITY): Payer: Self-pay | Admitting: Emergency Medicine

## 2016-06-25 ENCOUNTER — Emergency Department (HOSPITAL_COMMUNITY): Payer: Managed Care, Other (non HMO)

## 2016-06-25 DIAGNOSIS — R1084 Generalized abdominal pain: Secondary | ICD-10-CM | POA: Diagnosis not present

## 2016-06-25 DIAGNOSIS — R1012 Left upper quadrant pain: Secondary | ICD-10-CM | POA: Diagnosis present

## 2016-06-25 LAB — COMPREHENSIVE METABOLIC PANEL
ALT: 13 U/L — ABNORMAL LOW (ref 14–54)
AST: 15 U/L (ref 15–41)
Albumin: 4 g/dL (ref 3.5–5.0)
Alkaline Phosphatase: 90 U/L (ref 38–126)
Anion gap: 7 (ref 5–15)
BUN: 7 mg/dL (ref 6–20)
CO2: 23 mmol/L (ref 22–32)
Calcium: 8.8 mg/dL — ABNORMAL LOW (ref 8.9–10.3)
Chloride: 108 mmol/L (ref 101–111)
Creatinine, Ser: 0.87 mg/dL (ref 0.44–1.00)
GFR calc Af Amer: >60 mL/min (ref >60)
GFR calc non Af Amer: >60 mL/min (ref >60)
Glucose, Bld: 103 mg/dL — ABNORMAL HIGH (ref 65–99)
Potassium: 4 mmol/L (ref 3.5–5.1)
Sodium: 138 mmol/L (ref 135–145)
Total Bilirubin: 0.4 mg/dL (ref 0.3–1.2)
Total Protein: 7.4 g/dL (ref 6.5–8.1)

## 2016-06-25 LAB — URINALYSIS, ROUTINE W REFLEX MICROSCOPIC
Bilirubin Urine: NEGATIVE
Glucose, UA: NEGATIVE mg/dL
Hgb urine dipstick: NEGATIVE
Ketones, ur: NEGATIVE mg/dL
Leukocytes, UA: NEGATIVE
Nitrite: NEGATIVE
Protein, ur: NEGATIVE mg/dL
Specific Gravity, Urine: 1.01 (ref 1.005–1.030)
pH: 6 (ref 5.0–8.0)

## 2016-06-25 LAB — CBC WITH DIFFERENTIAL/PLATELET
Basophils Absolute: 0 10*3/uL (ref 0.0–0.1)
Basophils Relative: 0 %
Eosinophils Absolute: 0.4 10*3/uL (ref 0.0–0.7)
Eosinophils Relative: 5 %
HCT: 32.4 % — ABNORMAL LOW (ref 36.0–46.0)
Hemoglobin: 10.5 g/dL — ABNORMAL LOW (ref 12.0–15.0)
Lymphocytes Relative: 30 %
Lymphs Abs: 2.1 10*3/uL (ref 0.7–4.0)
MCH: 24.3 pg — ABNORMAL LOW (ref 26.0–34.0)
MCHC: 32.4 g/dL (ref 30.0–36.0)
MCV: 75 fL — ABNORMAL LOW (ref 78.0–100.0)
Monocytes Absolute: 0.5 10*3/uL (ref 0.1–1.0)
Monocytes Relative: 8 %
Neutro Abs: 3.9 10*3/uL (ref 1.7–7.7)
Neutrophils Relative %: 57 %
Platelets: 279 10*3/uL (ref 150–400)
RBC: 4.32 MIL/uL (ref 3.87–5.11)
RDW: 15.6 % — ABNORMAL HIGH (ref 11.5–15.5)
WBC: 6.9 10*3/uL (ref 4.0–10.5)

## 2016-06-25 LAB — LIPASE, BLOOD: Lipase: 40 U/L (ref 11–51)

## 2016-06-25 LAB — I-STAT BETA HCG BLOOD, ED (MC, WL, AP ONLY): I-stat hCG, quantitative: <5 m[IU]/mL (ref <5)

## 2016-06-25 MED ORDER — KETOROLAC TROMETHAMINE 30 MG/ML IJ SOLN
30.0000 mg | Freq: Once | INTRAMUSCULAR | Status: AC
  Administered 2016-06-25: 30 mg via INTRAVENOUS
  Filled 2016-06-25: qty 1

## 2016-06-25 MED ORDER — SODIUM CHLORIDE 0.9 % IV BOLUS (SEPSIS)
1000.0000 mL | Freq: Once | INTRAVENOUS | Status: AC
  Administered 2016-06-25: 1000 mL via INTRAVENOUS

## 2016-06-25 MED ORDER — ONDANSETRON HCL 4 MG/2ML IJ SOLN
4.0000 mg | Freq: Once | INTRAMUSCULAR | Status: AC
  Administered 2016-06-25: 4 mg via INTRAVENOUS
  Filled 2016-06-25: qty 2

## 2016-06-25 NOTE — ED Provider Notes (Signed)
[facility] DEPT Provider Note   CSN: [id number] Arrival date & time: 06/25/16  0407     History   Chief Complaint Chief Complaint  Patient presents with  . Abdominal Pain    HPI Erika David is a 40 y.o. female.  Patient is a 40 year old female with history of tubal ligation. She presents for evaluation of left sided abdominal pain that began yesterday afternoon. Her pain is crampy in nature. She denies any relation to food. She denies any fevers or chills. He denies any nausea, vomiting, or diarrhea. Her last menstrual period was one month ago and normal.   The history is provided by the patient.  Abdominal Pain   This is a new problem. The current episode started yesterday. Episode frequency: Intermittently. The problem has been gradually worsening. The pain is located in the LUQ and LLQ. The pain is moderate. Pertinent negatives include fever, vomiting, dysuria and frequency. Nothing aggravates the symptoms. Nothing relieves the symptoms.    Past Medical History:  Diagnosis Date  . Abnormal Pap smear     LEEP?  LAST PAP 05/2009  . Anemia 01/04/2012  . Chlamydia   . H/O candidiasis   . H/O dysmenorrhea 03/13/02  . H/O gonorrhea    treated  . H/O menorrhagia 07/09/02  . Infection 2006   GONORHRREA  . Infection 2006   CHLAMYDIA  . Infection    FREQ YEAST  . Pelvic injury AS TEEN   SPORTS RELATED  . Pelvic pain 07/11/10  . Trichomonas   . Yeast infection     Patient Active Problem List   Diagnosis Date Noted  . Vaginal discharge 05/14/2016  . Well adult on routine health check 05/14/2016  . S/P cesarean section 04/12/2012  . Sterilization 01/04/2012  . Overweight(278.02) 01/04/2012  . Anemia 01/04/2012  . Seasonal allergies 01/04/2012    Past Surgical History:  Procedure Laterality Date  . CESAREAN SECTION  04/11/2012   Procedure: CESAREAN SECTION;  Surgeon: Delice Lesch, MD;  Location: [facility];  Service: Gynecology;  Laterality: N/A;  twins  .  GYNECOLOGIC CRYOSURGERY    . TUBAL LIGATION    . WISDOM TOOTH EXTRACTION  2007    OB History    Gravida Para Term Preterm AB Living   6 4 4   2 5    SAB TAB Ectopic Multiple Live Births   1 1   1 5        Home Medications    Prior to Admission medications   Not on File    Family History Family History  Problem Relation Age of Onset  . Hypertension Mother   . Cancer Mother     BREAST  . Thyroid disease Mother   . Diabetes Father   . Hypertension Sister   . Lupus Maternal Aunt   . Liver disease Sister   . Asthma Cousin   . Hypertension Maternal Grandmother   . Diabetes Maternal Grandmother   . Heart disease Maternal Grandfather   . Stroke Maternal Grandfather   . Alcohol abuse Maternal Aunt   . Drug abuse Maternal Aunt   . Anesthesia problems Neg Hx     Social History Social History  Substance Use Topics  . Smoking status: Never Smoker  . Smokeless tobacco: Never Used  . Alcohol use No     Comment: occasional alcohol     Allergies   Dust mite extract; Kiwi extract; Pollen extract; and Soy allergy   Review of Systems Review  of Systems  Constitutional: Negative for fever.  Gastrointestinal: Positive for abdominal pain. Negative for vomiting.  Genitourinary: Negative for dysuria and frequency.  All other systems reviewed and are negative.    Physical Exam Updated Vital Signs BP 134/97 (BP Location: Right Arm)   Pulse 86   Temp 98.2 F (36.8 C) (Oral)   Resp 16   Ht 5' 1"  (1.549 m)   Wt 171 lb (77.6 kg)   LMP 05/26/2016   SpO2 98%   BMI 32.31 kg/m   Physical Exam  Constitutional: She is oriented to person, place, and time. She appears well-developed and well-nourished. No distress.  HENT:  Head: Normocephalic and atraumatic.  Neck: Normal range of motion. Neck supple.  Cardiovascular: Normal rate and regular rhythm.  Exam reveals no gallop and no friction rub.   No murmur heard. Pulmonary/Chest: Effort normal and breath sounds normal. No  respiratory distress. She has no wheezes.  Abdominal: Soft. Bowel sounds are normal. She exhibits no distension. There is tenderness. There is no rebound and no guarding.  There is tenderness to palpation in the left upper and left lower quadrant.  Musculoskeletal: Normal range of motion.  Neurological: She is alert and oriented to person, place, and time.  Skin: Skin is warm and dry. She is not diaphoretic.  Nursing note and vitals reviewed.    ED Treatments / Results  Labs (all labs ordered are listed, but only abnormal results are displayed) Labs Reviewed  COMPREHENSIVE METABOLIC PANEL  CBC WITH DIFFERENTIAL/PLATELET  LIPASE, BLOOD  URINALYSIS, ROUTINE W REFLEX MICROSCOPIC (NOT AT [facility]  I-STAT BETA HCG BLOOD, ED ([facility], [facility], [facility] ONLY)    EKG  EKG Interpretation None       Radiology No results found.  Procedures Procedures (including critical care time)  Medications Ordered in ED Medications  sodium chloride 0.9 % bolus 1,000 mL (not administered)  ondansetron (ZOFRAN) injection 4 mg (not administered)  ketorolac (TORADOL) 30 MG/ML injection 30 mg (not administered)     Initial Impression / Assessment and Plan / ED Course  I have reviewed the triage vital signs and the nursing notes.  Pertinent labs & imaging results that were available during my care of the patient were reviewed by me and considered in my medical decision making (see chart for details).  Clinical Course    Patient presents with left-sided abdominal pain that is worsened since yesterday. Her workup reveals no white count, unremarkable laboratory studies, no fever, and CT which fails to reveal any acute intra-abdominal process. There was the mention of a hazy appearance to the pancreas, however the lipase is normal. I see no indication for further workup. At this point she will be discharged with magnesium citrate and advised to follow-up if symptoms worsen or change.  Final Clinical Impressions(s) /  ED Diagnoses   Final diagnoses:  None    New Prescriptions New Prescriptions   No medications on file     Veryl Speak, MD 06/25/16 [phone]

## 2016-06-25 NOTE — Discharge Instructions (Signed)
Magnesium citrate: Drink entire 10 ounce bottle mixed with equal parts Sprite or Gatorade for relief of constipation.  Return to the emergency department if you develop worsening pain, high fever, bloody stool, or other new and concerning symptoms.

## 2016-06-25 NOTE — ED Notes (Signed)
Patient transported to CT 

## 2016-06-25 NOTE — ED Triage Notes (Signed)
Pt reports abd pain that started last night. Pt reports sharp pain in upper abd. Pt does report nausea but no vomiting or diarrhea.

## 2017-01-31 DIAGNOSIS — A6000 Herpesviral infection of urogenital system, unspecified: Secondary | ICD-10-CM | POA: Insufficient documentation

## 2017-06-11 ENCOUNTER — Ambulatory Visit (HOSPITAL_COMMUNITY)
Admission: EM | Admit: 2017-06-11 | Discharge: 2017-06-11 | Disposition: A | Discharge status: Home / Self Care | Payer: Managed Care, Other (non HMO) | Attending: Family Medicine | Admitting: Family Medicine

## 2017-06-11 ENCOUNTER — Encounter (HOSPITAL_COMMUNITY): Payer: Self-pay | Admitting: Family Medicine

## 2017-06-11 DIAGNOSIS — J40 Bronchitis, not specified as acute or chronic: Secondary | ICD-10-CM

## 2017-06-11 MED ORDER — AZITHROMYCIN 250 MG PO TABS: 250.0000 mg | ORAL_TABLET | Freq: Every day | ORAL | 0 refills | Status: DC

## 2017-06-11 MED ORDER — HYDROCODONE-HOMATROPINE 5-1.5 MG/5ML PO SYRP: 5.0000 mL | ORAL_SOLUTION | Freq: Four times a day (QID) | ORAL | 0 refills | Status: DC | PRN

## 2017-06-11 NOTE — ED Triage Notes (Signed)
Pt c/o cold sx onset 3 days associated w/prod cough, CP due to cough, BA, ST, HA, nasal congestion/drainage  Taking OTC Nyquil and allergy meds w/no relief.   A&O x4... NAD... Ambulatory

## 2017-06-11 NOTE — ED Provider Notes (Signed)
[place]   [id number] 06/11/17 Arrival Time: 1000   SUBJECTIVE:  Erika David is a 41 y.o. female who presents to the urgent care with complaint of cough, sore throat, myalgia, and night sweats. Symptoms began 1 week ago with runny nose and nasal congestion which patient thought was her seasonal allergies. Over the weekend, patient became much sicker and was unable to go to work yesterday.     Past Medical History:  Diagnosis Date  . Abnormal Pap smear     LEEP?  LAST PAP 05/2009  . Anemia 01/04/2012  . Chlamydia   . H/O candidiasis   . H/O dysmenorrhea 03/13/02  . H/O gonorrhea    treated  . H/O menorrhagia 07/09/02  . Infection 2006   GONORHRREA  . Infection 2006   CHLAMYDIA  . Infection    FREQ YEAST  . Pelvic injury AS TEEN   SPORTS RELATED  . Pelvic pain 07/11/10  . Trichomonas   . Yeast infection    Family History  Problem Relation Age of Onset  . Hypertension Mother   . Cancer Mother        BREAST  . Thyroid disease Mother   . Diabetes Father   . Hypertension Sister   . Lupus Maternal Aunt   . Liver disease Sister   . Asthma Cousin   . Hypertension Maternal Grandmother   . Diabetes Maternal Grandmother   . Heart disease Maternal Grandfather   . Stroke Maternal Grandfather   . Alcohol abuse Maternal Aunt   . Drug abuse Maternal Aunt   . Anesthesia problems Neg Hx    Social History   Social History  . Marital status: Single    Spouse name: N/A  . Number of children: 3  . Years of education: 9   Occupational History  . CNA    Social History Main Topics  . Smoking status: Never Smoker  . Smokeless tobacco: Never Used  . Alcohol use No     Comment: occasional alcohol  . Drug use: No  . Sexual activity: Yes    Birth control/ protection: Surgical     Comment: btl   Other Topics Concern  . Not on file   Social History Narrative  . No narrative on file   No outpatient prescriptions have been marked as taking for the 06/11/17  encounter [facility] Encounter).   Allergies  Allergen Reactions  . Dust Mite Extract Anaphylaxis  . Kiwi Extract Itching and Swelling  . Pollen Extract Anaphylaxis    Tree pollen in environment  . Soy Allergy Other (See Comments)    Per allergy testing      ROS: As per HPI, remainder of ROS negative.   OBJECTIVE:   Vitals:   06/11/17 1015  BP: 129/79  Pulse: 97  Resp: 20  Temp: 98.4 F (36.9 C)  TempSrc: Oral  SpO2: 100%     General appearance: alert; no distress Eyes: PERRL; EOMI; conjunctiva normal HENT: normocephalic; atraumatic; TMs normal, canal normal, external ears normal without trauma; nasal mucosa normal; oral mucosa normal Neck: supple Lungs: ronchi bilaterally Heart: regular rate and rhythm Back: no CVA tenderness Extremities: no cyanosis or edema; symmetrical with no gross deformities Skin: warm and dry Neurologic: normal gait; grossly normal Psychological: alert and cooperative; normal mood and affect      Labs:  Results for orders placed or performed during the hospital encounter of 06/25/16  Comprehensive metabolic panel  Result Value Ref Range   Sodium  138 135 - 145 mmol/L   Potassium 4.0 3.5 - 5.1 mmol/L   Chloride 108 101 - 111 mmol/L   CO2 23 22 - 32 mmol/L   Glucose, Bld 103 (H) 65 - 99 mg/dL   BUN 7 6 - 20 mg/dL   Creatinine, Ser 0.87 0.44 - 1.00 mg/dL   Calcium 8.8 (L) 8.9 - 10.3 mg/dL   Total Protein 7.4 6.5 - 8.1 g/dL   Albumin 4.0 3.5 - 5.0 g/dL   AST 15 15 - 41 U/L   ALT 13 (L) 14 - 54 U/L   Alkaline Phosphatase 90 38 - 126 U/L   Total Bilirubin 0.4 0.3 - 1.2 mg/dL   GFR calc non Af Amer >60 >60 mL/min   GFR calc Af Amer >60 >60 mL/min   Anion gap 7 5 - 15  CBC with Differential  Result Value Ref Range   WBC 6.9 4.0 - 10.5 K/uL   RBC 4.32 3.87 - 5.11 MIL/uL   Hemoglobin 10.5 (L) 12.0 - 15.0 g/dL   HCT 32.4 (L) 36.0 - 46.0 %   MCV 75.0 (L) 78.0 - 100.0 fL   MCH 24.3 (L) 26.0 - 34.0 pg   MCHC 32.4 30.0 - 36.0 g/dL     RDW 15.6 (H) 11.5 - 15.5 %   Platelets 279 150 - 400 K/uL   Neutrophils Relative % 57 %   Neutro Abs 3.9 1.7 - 7.7 K/uL   Lymphocytes Relative 30 %   Lymphs Abs 2.1 0.7 - 4.0 K/uL   Monocytes Relative 8 %   Monocytes Absolute 0.5 0.1 - 1.0 K/uL   Eosinophils Relative 5 %   Eosinophils Absolute 0.4 0.0 - 0.7 K/uL   Basophils Relative 0 %   Basophils Absolute 0.0 0.0 - 0.1 K/uL  Lipase, blood  Result Value Ref Range   Lipase 40 11 - 51 U/L  Urinalysis, Routine w reflex microscopic (not at [facility] [place])  Result Value Ref Range   Color, Urine YELLOW YELLOW   APPearance CLEAR CLEAR   Specific Gravity, Urine 1.010 1.005 - 1.030   pH 6.0 5.0 - 8.0   Glucose, UA NEGATIVE NEGATIVE mg/dL   Hgb urine dipstick NEGATIVE NEGATIVE   Bilirubin Urine NEGATIVE NEGATIVE   Ketones, ur NEGATIVE NEGATIVE mg/dL   Protein, ur NEGATIVE NEGATIVE mg/dL   Nitrite NEGATIVE NEGATIVE   Leukocytes, UA NEGATIVE NEGATIVE  I-Stat Beta hCG blood, ED ([facility], [facility], [facility] only)  Result Value Ref Range   I-stat hCG, quantitative <5.0 <5 mIU/mL   Comment 3            Labs Reviewed - No data to display  No results found.     ASSESSMENT & PLAN:  1. Bronchitis     Meds ordered this encounter  Medications  . HYDROcodone-homatropine (HYDROMET) 5-1.5 MG/5ML syrup    Sig: Take 5 mLs by mouth every 6 (six) hours as needed for cough.    Dispense:  60 mL    Refill:  0  . azithromycin (ZITHROMAX) 250 MG tablet    Sig: Take 1 tablet (250 mg total) by mouth daily. Take first 2 tablets together, then 1 every day until finished.    Dispense:  6 tablet    Refill:  0    Reviewed expectations re: course of current medical issues. Questions answered. Outlined signs and symptoms indicating need for more acute intervention. Patient verbalized understanding. After Visit Summary given.    Procedures:      Imoni Kohen,  Synetta Shadow, MD 06/11/17 1026

## 2017-10-19 ENCOUNTER — Other Ambulatory Visit: Payer: Self-pay

## 2017-10-19 ENCOUNTER — Encounter (HOSPITAL_BASED_OUTPATIENT_CLINIC_OR_DEPARTMENT_OTHER): Payer: Self-pay | Admitting: Emergency Medicine

## 2017-10-19 ENCOUNTER — Emergency Department (HOSPITAL_BASED_OUTPATIENT_CLINIC_OR_DEPARTMENT_OTHER)
Admission: EM | Admit: 2017-10-19 | Discharge: 2017-10-19 | Disposition: A | Discharge status: Home / Self Care | Payer: 59 | Attending: Physician Assistant | Admitting: Physician Assistant

## 2017-10-19 DIAGNOSIS — Z79899 Other long term (current) drug therapy: Secondary | ICD-10-CM | POA: Insufficient documentation

## 2017-10-19 DIAGNOSIS — J111 Influenza due to unidentified influenza virus with other respiratory manifestations: Secondary | ICD-10-CM | POA: Diagnosis not present

## 2017-10-19 DIAGNOSIS — R05 Cough: Secondary | ICD-10-CM | POA: Diagnosis present

## 2017-10-19 MED ORDER — ACETAMINOPHEN 500 MG PO TABS
1000.0000 mg | ORAL_TABLET | Freq: Once | ORAL | Status: AC
  Administered 2017-10-19: 1000 mg via ORAL
  Filled 2017-10-19: qty 2

## 2017-10-19 NOTE — ED Notes (Signed)
ED Provider at bedside. 

## 2017-10-19 NOTE — Discharge Instructions (Signed)
You are seen today with flulike illness.  Please use ibuprofen and Tylenol and alternate them in order to keep your fever and body aches under control.  Please rest drink plenty fluids and return with any concerns.

## 2017-10-19 NOTE — ED Notes (Signed)
Pt reports she got a flu shot at the end of Jan. 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 Took ibuprofen 2-3 hours pta

## 2017-10-19 NOTE — ED Provider Notes (Signed)
[facility] DEPART[facility] Provider Note   CSN: [id number] Arrival date & time: 10/19/17  1417     History   Chief Complaint Chief Complaint  Patient presents with  . Cough  . Sore Throat    HPI Erika David is a 42 y.o. female.  HPI   Patient is a 42 year old female presenting with flulike symptoms.  Patient's other children and family members had flu 2 weeks ago.  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  She has muscle aches, fevers, cough, congestion and mostly muscle aches.  Patient able to take fluids.  Vomited one time.  Past Medical History:  Diagnosis Date  . Abnormal Pap smear     LEEP?  LAST PAP 05/2009  . Anemia 01/04/2012  . Chlamydia   . H/O candidiasis   . H/O dysmenorrhea 03/13/02  . H/O gonorrhea    treated  . H/O menorrhagia 07/09/02  . Infection 2006   GONORHRREA  . Infection 2006   CHLAMYDIA  . Infection    FREQ YEAST  . Pelvic injury AS TEEN   SPORTS RELATED  . Pelvic pain 07/11/10  . Trichomonas   . Yeast infection     Patient Active Problem List   Diagnosis Date Noted  . Vaginal discharge 05/14/2016  . Well adult on routine health check 05/14/2016  . S/P cesarean section 04/12/2012  . Sterilization 01/04/2012  . Overweight(278.02) 01/04/2012  . Anemia 01/04/2012  . Seasonal allergies 01/04/2012    Past Surgical History:  Procedure Laterality Date  . CESAREAN SECTION  04/11/2012   Procedure: CESAREAN SECTION;  Surgeon: Delice Lesch, MD;  Location: [facility];  Service: Gynecology;  Laterality: N/A;  twins  . GYNECOLOGIC CRYOSURGERY    . TUBAL LIGATION    . WISDOM TOOTH EXTRACTION  2007    OB History    Gravida Para Term Preterm AB Living   6 4 4   2 5    SAB TAB Ectopic Multiple Live Births   1 1   1 5        Home Medications    Prior to Admission medications   Medication Sig Start Date End Date Taking? Authorizing Provider  azithromycin (ZITHROMAX) 250 MG tablet  Take 1 tablet (250 mg total) by mouth daily. Take first 2 tablets together, then 1 every day until finished. 06/11/17   Robyn Haber, MD  HYDROcodone-homatropine (HYDROMET) 5-1.5 MG/5ML syrup Take 5 mLs by mouth every 6 (six) hours as needed for cough. 06/11/17   Robyn Haber, MD    Family History Family History  Problem Relation Age of Onset  . Hypertension Mother   . Cancer Mother        BREAST  . Thyroid disease Mother   . Diabetes Father   . Hypertension Sister   . Lupus Maternal Aunt   . Liver disease Sister   . Asthma Cousin   . Hypertension Maternal Grandmother   . Diabetes Maternal Grandmother   . Heart disease Maternal Grandfather   . Stroke Maternal Grandfather   . Alcohol abuse Maternal Aunt   . Drug abuse Maternal Aunt   . Anesthesia problems Neg Hx     Social History Social History   Tobacco Use  . Smoking status: Never Smoker  . Smokeless tobacco: Never Used  Substance Use Topics  . Alcohol use: No    Comment: occasional alcohol  . Drug use: No  Allergies   Dust mite extract; Kiwi extract; Pollen extract; and Soy allergy   Review of Systems Review of Systems  Constitutional: Positive for appetite change, fatigue and fever.  HENT: Positive for congestion. Negative for drooling.   Eyes: Negative for discharge.  Respiratory: Positive for cough. Negative for chest tightness.   Cardiovascular: Negative for chest pain.  Gastrointestinal: Negative for abdominal distention.  Genitourinary: Negative for difficulty urinating and dysuria.  Musculoskeletal: Positive for myalgias. Negative for joint swelling.  Skin: Negative for rash.  Allergic/Immunologic: Negative for immunocompromised state.  All other systems reviewed and are negative.    Physical Exam Updated Vital Signs BP (!) 143/93 (BP Location: Left Arm)   Pulse (!) 110   Temp 99.8 F (37.7 C) (Oral)   Resp 18   Ht 5' 1.5" (1.562 m)   Wt 79.8 kg (176 lb)   LMP 10/07/2017   SpO2  98%   BMI 32.72 kg/m   Physical Exam  Constitutional: She appears well-developed and well-nourished. No distress.  HENT:  Head: Normocephalic and atraumatic.  Right Ear: Tympanic membrane normal.  Left Ear: Tympanic membrane normal.  Eyes: Conjunctivae are normal.  Neck: Neck supple.  Cardiovascular: Normal rate and regular rhythm.  No murmur heard. Pulmonary/Chest: Effort normal and breath sounds normal. No respiratory distress.  Abdominal: Soft. There is no tenderness.  Musculoskeletal: She exhibits no edema.  Neurological: She is alert.  Skin: Skin is warm and dry.  Psychiatric: She has a normal mood and affect.  Nursing note and vitals reviewed.    ED Treatments / Results  Labs (all labs ordered are listed, but only abnormal results are displayed) Labs Reviewed - No data to display  EKG  EKG Interpretation None       Radiology No results found.  Procedures Procedures (including critical care time)  Medications Ordered in ED Medications - No data to display   Initial Impression / Assessment and Plan / ED Course  I have reviewed the triage vital signs and the nursing notes.  Pertinent labs & imaging results that were available during my care of the patient were reviewed by me and considered in my medical decision making (see chart for details).      Patient is a 42 year old female presenting with flulike symptoms.  Patient's other children and family members had flu 2 weeks ago.  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  She has muscle aches, fevers, cough, congestion and mostly muscle aches.  Patient able to take fluids.  Vomited one time.   3:47 PM Patient appears very well.  Will have her use ibuprofen Tylenol.  Will treat for flu presumtively..  Patient taking fluids fine.  Discussed Tamiflu and she feels its cost prohibitive and she has had symptomst for 48 hours  Patient is comfortable, ambulatory, and  taking PO at time of discharge.  Patient expressed understanding about return precautions.    .  Final Clinical Impressions(s) / ED Diagnoses   Final diagnoses:  None    ED Discharge Orders    None       Nichoals Heyde, Fredia Sorrow, MD 10/19/17 1549

## 2017-10-19 NOTE — ED Triage Notes (Signed)
Cough and sore throat since yesterday. Child has similar symptoms.

## 2018-10-17 DIAGNOSIS — J3089 Other allergic rhinitis: Secondary | ICD-10-CM | POA: Insufficient documentation

## 2018-10-17 DIAGNOSIS — J324 Chronic pansinusitis: Secondary | ICD-10-CM | POA: Insufficient documentation

## 2019-06-18 ENCOUNTER — Emergency Department (HOSPITAL_COMMUNITY)
Admission: EM | Admit: 2019-06-18 | Discharge: 2019-06-19 | Disposition: A | Discharge status: Home / Self Care | Payer: 59 | Attending: Emergency Medicine | Admitting: Emergency Medicine

## 2019-06-18 ENCOUNTER — Emergency Department (HOSPITAL_COMMUNITY): Payer: 59

## 2019-06-18 ENCOUNTER — Other Ambulatory Visit: Payer: Self-pay

## 2019-06-18 ENCOUNTER — Encounter (HOSPITAL_COMMUNITY): Payer: Self-pay | Admitting: Emergency Medicine

## 2019-06-18 DIAGNOSIS — Z20828 Contact with and (suspected) exposure to other viral communicable diseases: Secondary | ICD-10-CM | POA: Insufficient documentation

## 2019-06-18 DIAGNOSIS — Z20822 Contact with and (suspected) exposure to covid-19: Secondary | ICD-10-CM

## 2019-06-18 DIAGNOSIS — R519 Headache, unspecified: Secondary | ICD-10-CM | POA: Diagnosis not present

## 2019-06-18 LAB — COMPREHENSIVE METABOLIC PANEL
ALT: 17 U/L (ref 0–44)
AST: 18 U/L (ref 15–41)
Albumin: 4.1 g/dL (ref 3.5–5.0)
Alkaline Phosphatase: 117 U/L (ref 38–126)
Anion gap: 9 (ref 5–15)
BUN: 6 mg/dL (ref 6–20)
CO2: 24 mmol/L (ref 22–32)
Calcium: 8.7 mg/dL — ABNORMAL LOW (ref 8.9–10.3)
Chloride: 107 mmol/L (ref 98–111)
Creatinine, Ser: 0.73 mg/dL (ref 0.44–1.00)
GFR calc Af Amer: >60 mL/min (ref >60)
GFR calc non Af Amer: >60 mL/min (ref >60)
Glucose, Bld: 131 mg/dL — ABNORMAL HIGH (ref 70–99)
Potassium: 3.3 mmol/L — ABNORMAL LOW (ref 3.5–5.1)
Sodium: 140 mmol/L (ref 135–145)
Total Bilirubin: 0.2 mg/dL — ABNORMAL LOW (ref 0.3–1.2)
Total Protein: 7.4 g/dL (ref 6.5–8.1)

## 2019-06-18 LAB — CBC
HCT: 32.2 % — ABNORMAL LOW (ref 36.0–46.0)
Hemoglobin: 9.3 g/dL — ABNORMAL LOW (ref 12.0–15.0)
MCH: 21.7 pg — ABNORMAL LOW (ref 26.0–34.0)
MCHC: 28.9 g/dL — ABNORMAL LOW (ref 30.0–36.0)
MCV: 75.2 fL — ABNORMAL LOW (ref 80.0–100.0)
Platelets: 368 10*3/uL (ref 150–400)
RBC: 4.28 MIL/uL (ref 3.87–5.11)
RDW: 19 % — ABNORMAL HIGH (ref 11.5–15.5)
WBC: 6.6 10*3/uL (ref 4.0–10.5)
nRBC: 0 % (ref 0.0–0.2)

## 2019-06-18 LAB — I-STAT BETA HCG BLOOD, ED (MC, WL, AP ONLY): I-stat hCG, quantitative: <5 m[IU]/mL (ref <5)

## 2019-06-18 LAB — LIPASE, BLOOD: Lipase: 24 U/L (ref 11–51)

## 2019-06-18 MED ORDER — ONDANSETRON 4 MG PO TBDP
4.0000 mg | ORAL_TABLET | Freq: Once | ORAL | Status: AC
  Administered 2019-06-18: 4 mg via ORAL
  Filled 2019-06-18: qty 1

## 2019-06-18 MED ORDER — KETOROLAC TROMETHAMINE 30 MG/ML IJ SOLN
60.0000 mg | Freq: Once | INTRAMUSCULAR | Status: AC
  Administered 2019-06-18: 60 mg via INTRAMUSCULAR
  Filled 2019-06-18: qty 2

## 2019-06-18 NOTE — ED Triage Notes (Signed)
Patient reports headache, sore throat, diarrhea x3 days. 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

## 2019-06-18 NOTE — ED Provider Notes (Signed)
TIME SEEN: 11:33 PM  CHIEF COMPLAINT: Headache, sore throat, cough, shortness of breath, nausea, diarrhea, chills  HPI: Patient is a 43 year old female with no significant past medical history who presents to the emergency department with 1 week of headache, dry cough, sore throat, shortness of breath, nausea, diarrhea and chills.  No documented fevers.  No neck pain or neck stiffness.  No vomiting or abdominal pain.  No chest pain or chest discomfort.  States she works at [employer] and has been exposed to school-aged children and is concerned she could have [place].  Has been taking Tylenol for her headache at home with good relief.  No photophobia, numbness, weakness.  She denies any vaginal bleeding, abnormal discharge, dysuria, hematuria.   ROS: See HPI Constitutional: no fever  Eyes: no drainage  ENT: no runny nose   Cardiovascular:  no chest pain  Resp:  SOB  GI: no vomiting, + diarrhea GU: no dysuria Integumentary: no rash  Allergy: no hives  Musculoskeletal: no leg swelling  Neurological: no slurred speech ROS otherwise negative  PAST MEDICAL HISTORY/PAST SURGICAL HISTORY:  Past Medical History:  Diagnosis Date  . Abnormal Pap smear     LEEP?  LAST PAP 05/2009  . Anemia 01/04/2012  . Chlamydia   . H/O candidiasis   . H/O dysmenorrhea 03/13/02  . H/O gonorrhea    treated  . H/O menorrhagia 07/09/02  . Infection 2006   GONORHRREA  . Infection 2006   CHLAMYDIA  . Infection    FREQ YEAST  . Pelvic injury AS TEEN   SPORTS RELATED  . Pelvic pain 07/11/10  . Trichomonas   . Yeast infection     MEDICATIONS:  Prior to Admission medications   Medication Sig Start Date End Date Taking? Authorizing Provider  azithromycin (ZITHROMAX) 250 MG tablet Take 1 tablet (250 mg total) by mouth daily. Take first 2 tablets together, then 1 every day until finished. 06/11/17   Robyn Haber, MD  HYDROcodone-homatropine (HYDROMET) 5-1.5 MG/5ML syrup Take 5 mLs by mouth  every 6 (six) hours as needed for cough. 06/11/17   Robyn Haber, MD    ALLERGIES:  Allergies  Allergen Reactions  . Dust Mite Extract Anaphylaxis  . Kiwi Extract Itching and Swelling  . Pollen Extract Anaphylaxis    Tree pollen in environment  . Soy Allergy Other (See Comments)    Per allergy testing    SOCIAL HISTORY:  Social History   Tobacco Use  . Smoking status: Never Smoker  . Smokeless tobacco: Never Used  Substance Use Topics  . Alcohol use: No    Comment: occasional alcohol    FAMILY HISTORY: Family History  Problem Relation Age of Onset  . Hypertension Mother   . Cancer Mother        BREAST  . Thyroid disease Mother   . Diabetes Father   . Hypertension Sister   . Lupus Maternal Aunt   . Liver disease Sister   . Asthma Cousin   . Hypertension Maternal Grandmother   . Diabetes Maternal Grandmother   . Heart disease Maternal Grandfather   . Stroke Maternal Grandfather   . Alcohol abuse Maternal Aunt   . Drug abuse Maternal Aunt   . Anesthesia problems Neg Hx     EXAM: BP (!) 143/95   Pulse 71   Temp 98.5 F (36.9 C) (Oral)   Resp 16   LMP 06/17/2019   SpO2 100%  CONSTITUTIONAL: Alert and oriented and responds appropriately  to questions. Well-appearing; well-nourished HEAD: Normocephalic EYES: Conjunctivae clear, pupils appear equal, EOMI ENT: normal nose; moist mucous membranes; No pharyngeal erythema or petechiae, no tonsillar hypertrophy or exudate, no uvular deviation, no unilateral swelling, no trismus or drooling, no muffled voice, normal phonation, no stridor, no dental caries present, no drainable dental abscess noted, no Ludwig's angina, tongue sits flat in the bottom of the mouth, no angioedema, no facial erythema or warmth, no facial swelling; no pain with movement of the neck. NECK: Supple, no meningismus, no nuchal rigidity, no LAD  CARD: RRR; S1 and S2 appreciated; no murmurs, no clicks, no rubs, no gallops RESP: Normal chest  excursion without splinting or tachypnea; breath sounds clear and equal bilaterally; no wheezes, no rhonchi, no rales, no hypoxia or respiratory distress, speaking full sentences ABD/GI: Normal bowel sounds; non-distended; soft, non-tender, no rebound, no guarding, no peritoneal signs, no hepatosplenomegaly BACK:  The back appears normal and is non-tender to palpation, there is no CVA tenderness EXT: Normal ROM in all joints; non-tender to palpation; no edema; normal capillary refill; no cyanosis, no calf tenderness or swelling    SKIN: Normal color for age and race; warm; no rash NEURO: Moves all extremities equally, normal speech, no facial asymmetry PSYCH: The patient's mood and manner are appropriate. Grooming and personal hygiene are appropriate.  MEDICAL DECISION MAKING: Patient here with symptoms of viral illness.  Patient could have COVID-19.  Will send COVID testing.  Labs here are unremarkable.  Will obtain chest x-ray.  Will treat symptoms with IM Toradol, Zofran.  Doubt meningitis.  No respiratory distress or hypoxia.  No increased work of breathing.  She appears well-hydrated on exam.  ED PROGRESS: Patient's chest x-ray is clear.  COVID test is pending.  Headache resolved after IM Toradol.  No longer feeling nauseated.  I feel she is safe to be discharged home.  Recommended alternating Tylenol and Motrin for fever and pain.  Recommended over-the-counter Imodium for diarrhea.  Will discharge with prescription of Zofran.  Discussed supportive care instructions and return precautions.  Will provide with work note.  Recommended self quarantine until she has a negative test or at least 10 days after the onset of symptoms and she has been fever free for 3 days before going back to work.   At this time, I do not feel there is any life-threatening condition present. I have reviewed and discussed all results (EKG, imaging, lab, urine as appropriate) and exam findings with patient/family. I have  reviewed nursing notes and appropriate previous records.  I feel the patient is safe to be discharged home without further emergent workup and can continue workup as an outpatient as needed. Discussed usual and customary return precautions. Patient/family verbalize understanding and are comfortable with this plan.  Outpatient follow-up has been provided as needed. All questions have been answered.   Erika David was evaluated in Emergency Department on 06/18/2019 for the symptoms described in the history of present illness. She was evaluated in the context of the global COVID-19 pandemic, which necessitated consideration that the patient might be at risk for infection with the SARS-CoV-2 virus that causes COVID-19. Institutional protocols and algorithms that pertain to the evaluation of patients at risk for COVID-19 are in a state of rapid change based on information released by regulatory bodies including the CDC and federal and state organizations. These policies and algorithms were followed during the patient's care in the ED.    Shem Plemmons, Delice Bison, DO 06/19/19 [phone]

## 2019-06-18 NOTE — ED Notes (Signed)
WENT OUTSIDE WITH DAUGHTER.

## 2019-06-19 LAB — SARS CORONAVIRUS 2 (TAT 6-24 HRS): SARS Coronavirus 2: NEGATIVE

## 2019-06-19 MED ORDER — ONDANSETRON 4 MG PO TBDP: 4.0000 mg | ORAL_TABLET | Freq: Four times a day (QID) | ORAL | 0 refills | Status: DC | PRN

## 2019-06-19 NOTE — Discharge Instructions (Signed)
You may alternate Tylenol 1000 mg every 6 hours as needed for pain and Ibuprofen 800 mg every 8 hours as needed for pain.  Please take Ibuprofen with food.   You may use over-the-counter Imodium as needed for diarrhea.   You should remain in quarantine for at least 10 days after the onset of symptoms and you will need to be fever free for at least 3 full days without using Tylenol or ibuprofen.   You may follow-up on your COVID results in my chart.

## 2019-10-19 ENCOUNTER — Emergency Department (HOSPITAL_BASED_OUTPATIENT_CLINIC_OR_DEPARTMENT_OTHER)
Admission: EM | Admit: 2019-10-19 | Discharge: 2019-10-19 | Disposition: A | Discharge status: Home / Self Care | Payer: BC Managed Care – PPO | Attending: Emergency Medicine | Admitting: Emergency Medicine

## 2019-10-19 ENCOUNTER — Encounter (HOSPITAL_BASED_OUTPATIENT_CLINIC_OR_DEPARTMENT_OTHER): Payer: Self-pay

## 2019-10-19 ENCOUNTER — Emergency Department (HOSPITAL_BASED_OUTPATIENT_CLINIC_OR_DEPARTMENT_OTHER): Payer: BC Managed Care – PPO

## 2019-10-19 ENCOUNTER — Other Ambulatory Visit: Payer: Self-pay

## 2019-10-19 DIAGNOSIS — J029 Acute pharyngitis, unspecified: Secondary | ICD-10-CM

## 2019-10-19 DIAGNOSIS — R197 Diarrhea, unspecified: Secondary | ICD-10-CM | POA: Diagnosis not present

## 2019-10-19 DIAGNOSIS — Z91018 Allergy to other foods: Secondary | ICD-10-CM | POA: Insufficient documentation

## 2019-10-19 DIAGNOSIS — R05 Cough: Secondary | ICD-10-CM | POA: Insufficient documentation

## 2019-10-19 DIAGNOSIS — R519 Headache, unspecified: Secondary | ICD-10-CM | POA: Insufficient documentation

## 2019-10-19 DIAGNOSIS — R059 Cough, unspecified: Secondary | ICD-10-CM

## 2019-10-19 DIAGNOSIS — Z20822 Contact with and (suspected) exposure to covid-19: Secondary | ICD-10-CM

## 2019-10-19 DIAGNOSIS — R5383 Other fatigue: Secondary | ICD-10-CM | POA: Diagnosis not present

## 2019-10-19 DIAGNOSIS — A09 Infectious gastroenteritis and colitis, unspecified: Secondary | ICD-10-CM | POA: Diagnosis not present

## 2019-10-19 LAB — SARS CORONAVIRUS 2 AG (30 MIN TAT): SARS Coronavirus 2 Ag: NEGATIVE

## 2019-10-19 NOTE — ED Triage Notes (Signed)
Pt arrives ambulatory to ED with reports of multiple Covid exposures after a family funeral. Pt also reports having sore throat and cough with some diarrhea.

## 2019-10-19 NOTE — ED Provider Notes (Signed)
[facility] EMERGENCY DEPARTMENT Provider Note   CSN: [id number] Arrival date & time: 10/19/19  6440     History Chief Complaint  Patient presents with  . Fatigue    Erika David is a 44 y.o. female.  44 year old female who presents with sore throat and cough.  Patient reports that yesterday she began having a cough associated with sore throat, runny nose, fatigue, and intermittent headaches.  At first she thought it was her allergies but her symptoms have persisted and her boyfriend has similar symptoms.  They found out that her boyfriend had a positive COVID-19 contact at work and patient reports that she was around family members this week for a funeral and a family member recently tested positive for COVID-19.  Boyfriend is currently here also getting tested.  Patient reports some associated diarrhea but no vomiting or breathing problems.  No known fevers and no body aches.  The history is provided by the patient.       Past Medical History:  Diagnosis Date  . Abnormal Pap smear     LEEP?  LAST PAP 05/2009  . Anemia 01/04/2012  . Chlamydia   . H/O candidiasis   . H/O dysmenorrhea 03/13/02  . H/O gonorrhea    treated  . H/O menorrhagia 07/09/02  . Infection 2006   GONORHRREA  . Infection 2006   CHLAMYDIA  . Infection    FREQ YEAST  . Pelvic injury AS TEEN   SPORTS RELATED  . Pelvic pain 07/11/10  . Trichomonas   . Yeast infection     Patient Active Problem List   Diagnosis Date Noted  . Vaginal discharge 05/14/2016  . Well adult on routine health check 05/14/2016  . S/P cesarean section 04/12/2012  . Sterilization 01/04/2012  . Overweight(278.02) 01/04/2012  . Anemia 01/04/2012  . Seasonal allergies 01/04/2012    Past Surgical History:  Procedure Laterality Date  . CESAREAN SECTION  04/11/2012   Procedure: CESAREAN SECTION;  Surgeon: Delice Lesch, MD;  Location: [facility];  Service: Gynecology;  Laterality: N/A;  twins  . GYNECOLOGIC CRYOSURGERY      . TUBAL LIGATION    . WISDOM TOOTH EXTRACTION  2007     OB History    Gravida  6   Para  4   Term  4   Preterm      AB  2   Living  5     SAB  1   TAB  1   Ectopic      Multiple  1   Live Births  5           Family History  Problem Relation Age of Onset  . Hypertension Mother   . Cancer Mother        BREAST  . Thyroid disease Mother   . Diabetes Father   . Hypertension Sister   . Lupus Maternal Aunt   . Liver disease Sister   . Asthma Cousin   . Hypertension Maternal Grandmother   . Diabetes Maternal Grandmother   . Heart disease Maternal Grandfather   . Stroke Maternal Grandfather   . Alcohol abuse Maternal Aunt   . Drug abuse Maternal Aunt   . Anesthesia problems Neg Hx     Social History   Tobacco Use  . Smoking status: Never Smoker  . Smokeless tobacco: Never Used  Substance Use Topics  . Alcohol use: No    Comment: occasional alcohol  . Drug use: No  Home Medications Prior to Admission medications   Medication Sig Start Date End Date Taking? Authorizing Provider  azithromycin (ZITHROMAX) 250 MG tablet Take 1 tablet (250 mg total) by mouth daily. Take first 2 tablets together, then 1 every day until finished. Patient not taking: Reported on 06/19/2019 06/11/17   Robyn Haber, MD  cetirizine (ZYRTEC) 10 MG tablet Take 10 mg by mouth daily.    [provider]  HYDROcodone-homatropine (HYDROMET) 5-1.5 MG/5ML syrup Take 5 mLs by mouth every 6 (six) hours as needed for cough. Patient not taking: Reported on 06/19/2019 06/11/17   Robyn Haber, MD  ondansetron (ZOFRAN ODT) 4 MG disintegrating tablet Take 1 tablet (4 mg total) by mouth every 6 (six) hours as needed for nausea or vomiting. 06/19/19   Ward, Delice Bison, DO    Allergies    Dust mite extract, Kiwi extract, and Pollen extract  Review of Systems   Review of Systems All other systems reviewed and are negative except that which was mentioned in HPI  Physical  Exam Updated Vital Signs BP (!) 144/102 (BP Location: Right Arm)   Pulse 79   Temp 99 F (37.2 C) (Oral)   Resp 18   Ht 5' 1"  (1.549 m)   Wt 82.1 kg   LMP 10/01/2019   SpO2 98%   BMI 34.20 kg/m   Physical Exam Vitals and nursing note reviewed.  Constitutional:      General: She is not in acute distress.    Appearance: She is well-developed.  HENT:     Head: Normocephalic and atraumatic.     Nose: Nose normal.     Mouth/Throat:     Mouth: Mucous membranes are moist.     Pharynx: Oropharynx is clear. No posterior oropharyngeal erythema.  Eyes:     Conjunctiva/sclera: Conjunctivae normal.  Cardiovascular:     Rate and Rhythm: Normal rate and regular rhythm.     Heart sounds: Normal heart sounds. No murmur.  Pulmonary:     Effort: Pulmonary effort is normal.     Breath sounds: Normal breath sounds.  Abdominal:     General: There is no distension.     Palpations: Abdomen is soft.     Tenderness: There is no abdominal tenderness.  Musculoskeletal:     Cervical back: Normal range of motion and neck supple.     Right lower leg: No edema.     Left lower leg: No edema.  Skin:    General: Skin is warm and dry.  Neurological:     Mental Status: She is alert and oriented to person, place, and time.     Comments: Fluent speech  Psychiatric:        Judgment: Judgment normal.     ED Results / Procedures / Treatments   Labs (all labs ordered are listed, but only abnormal results are displayed) Labs Reviewed  SARS CORONAVIRUS 2 AG (30 MIN TAT)  NOVEL CORONAVIRUS, NAA (HOSP ORDER, SEND-OUT TO REF LAB; TAT 18-24 HRS)    EKG None  Radiology DG Chest Portable 1 View  Result Date: 10/19/2019 CLINICAL DATA:  Cough and recent COVID exposure EXAM: PORTABLE CHEST 1 VIEW COMPARISON:  06/18/2019 FINDINGS: Cardiac shadow is stable. The lungs are well aerated bilaterally. No focal infiltrate or sizable effusion is seen. No bony abnormality is noted. IMPRESSION: No acute abnormality  seen. Electronically Signed   By: Inez Catalina M.D.   On: 10/19/2019 09:57    Procedures Procedures (including critical care time)  Medications Ordered in ED Medications - No data to display  ED Course  I have reviewed the triage vital signs and the nursing notes.  Pertinent labs & imaging results that were available during my care of the patient were reviewed by me and considered in my medical decision making (see chart for details).    MDM Rules/Calculators/A&P                      Well appearing on exam, normal O2 sat, reassuring VS. CXR clear. Rapid covid negative, however due to inaccuracy of this test I recommended send-out PCR.  She understands that she will need to strictly quarantine until results are available.  I explained that if boyfriend tests positive then she is very likely also positive.  I have discussed supportive measures for her symptoms and extensively reviewed return precautions particularly regarding any breathing problems.  She voiced understanding.  Io L Kepple was evaluated in Emergency Department on 10/19/2019 for the symptoms described in the history of present illness. She was evaluated in the context of the global COVID-19 pandemic, which necessitated consideration that the patient might be at risk for infection with the SARS-CoV-2 virus that causes COVID-19. Institutional protocols and algorithms that pertain to the evaluation of patients at risk for COVID-19 are in a state of rapid change based on information released by regulatory bodies including the CDC and federal and state organizations. These policies and algorithms were followed during the patient's care in the ED.  Final Clinical Impression(s) / ED Diagnoses Final diagnoses:  Suspected COVID-19 virus infection  Sore throat  Diarrhea of presumed infectious origin  Cough    Rx / DC Orders ED Discharge Orders    None       Navarro Nine, Wenda Overland, MD 10/19/19 1027

## 2019-10-21 LAB — NOVEL CORONAVIRUS, NAA (HOSP ORDER, SEND-OUT TO REF LAB; TAT 18-24 HRS): SARS-CoV-2, NAA: NOT DETECTED

## 2019-11-05 ENCOUNTER — Emergency Department (HOSPITAL_BASED_OUTPATIENT_CLINIC_OR_DEPARTMENT_OTHER)
Admission: EM | Admit: 2019-11-05 | Discharge: 2019-11-05 | Disposition: A | Discharge status: Home / Self Care | Payer: BC Managed Care – PPO | Attending: Emergency Medicine | Admitting: Emergency Medicine

## 2019-11-05 ENCOUNTER — Other Ambulatory Visit: Payer: Self-pay

## 2019-11-05 ENCOUNTER — Telehealth: Payer: Self-pay | Admitting: Internal Medicine

## 2019-11-05 ENCOUNTER — Emergency Department (HOSPITAL_BASED_OUTPATIENT_CLINIC_OR_DEPARTMENT_OTHER): Payer: BC Managed Care – PPO

## 2019-11-05 ENCOUNTER — Encounter (HOSPITAL_BASED_OUTPATIENT_CLINIC_OR_DEPARTMENT_OTHER): Payer: Self-pay | Admitting: Emergency Medicine

## 2019-11-05 DIAGNOSIS — K529 Noninfective gastroenteritis and colitis, unspecified: Secondary | ICD-10-CM | POA: Insufficient documentation

## 2019-11-05 DIAGNOSIS — Z79899 Other long term (current) drug therapy: Secondary | ICD-10-CM | POA: Insufficient documentation

## 2019-11-05 DIAGNOSIS — E876 Hypokalemia: Secondary | ICD-10-CM

## 2019-11-05 DIAGNOSIS — R1032 Left lower quadrant pain: Secondary | ICD-10-CM | POA: Diagnosis not present

## 2019-11-05 LAB — COMPREHENSIVE METABOLIC PANEL
ALT: 12 U/L (ref 0–44)
AST: 16 U/L (ref 15–41)
Albumin: 3.8 g/dL (ref 3.5–5.0)
Alkaline Phosphatase: 110 U/L (ref 38–126)
Anion gap: 9 (ref 5–15)
BUN: 7 mg/dL (ref 6–20)
CO2: 24 mmol/L (ref 22–32)
Calcium: 8.9 mg/dL (ref 8.9–10.3)
Chloride: 104 mmol/L (ref 98–111)
Creatinine, Ser: 0.81 mg/dL (ref 0.44–1.00)
GFR calc Af Amer: >60 mL/min (ref >60)
GFR calc non Af Amer: >60 mL/min (ref >60)
Glucose, Bld: 102 mg/dL — ABNORMAL HIGH (ref 70–99)
Potassium: 2.9 mmol/L — ABNORMAL LOW (ref 3.5–5.1)
Sodium: 137 mmol/L (ref 135–145)
Total Bilirubin: 0.2 mg/dL — ABNORMAL LOW (ref 0.3–1.2)
Total Protein: 7.3 g/dL (ref 6.5–8.1)

## 2019-11-05 LAB — CBC WITH DIFFERENTIAL/PLATELET
Abs Immature Granulocytes: 0.02 10*3/uL (ref 0.00–0.07)
Basophils Absolute: 0 10*3/uL (ref 0.0–0.1)
Basophils Relative: 1 %
Eosinophils Absolute: 0.5 10*3/uL (ref 0.0–0.5)
Eosinophils Relative: 6 %
HCT: 32.2 % — ABNORMAL LOW (ref 36.0–46.0)
Hemoglobin: 9.7 g/dL — ABNORMAL LOW (ref 12.0–15.0)
Immature Granulocytes: 0 %
Lymphocytes Relative: 23 %
Lymphs Abs: 1.7 10*3/uL (ref 0.7–4.0)
MCH: 21.7 pg — ABNORMAL LOW (ref 26.0–34.0)
MCHC: 30.1 g/dL (ref 30.0–36.0)
MCV: 72 fL — ABNORMAL LOW (ref 80.0–100.0)
Monocytes Absolute: 0.8 10*3/uL (ref 0.1–1.0)
Monocytes Relative: 11 %
Neutro Abs: 4.4 10*3/uL (ref 1.7–7.7)
Neutrophils Relative %: 59 %
Platelets: 343 10*3/uL (ref 150–400)
RBC: 4.47 MIL/uL (ref 3.87–5.11)
RDW: 18.4 % — ABNORMAL HIGH (ref 11.5–15.5)
WBC: 7.4 10*3/uL (ref 4.0–10.5)
nRBC: 0 % (ref 0.0–0.2)

## 2019-11-05 LAB — URINALYSIS, ROUTINE W REFLEX MICROSCOPIC
Bilirubin Urine: NEGATIVE
Glucose, UA: NEGATIVE mg/dL
Hgb urine dipstick: NEGATIVE
Ketones, ur: NEGATIVE mg/dL
Leukocytes,Ua: NEGATIVE
Nitrite: NEGATIVE
Protein, ur: NEGATIVE mg/dL
Specific Gravity, Urine: >1.03 — ABNORMAL HIGH (ref 1.005–1.030)
pH: 6 (ref 5.0–8.0)

## 2019-11-05 LAB — LIPASE, BLOOD: Lipase: 20 U/L (ref 11–51)

## 2019-11-05 LAB — MAGNESIUM: Magnesium: 2.1 mg/dL (ref 1.7–2.4)

## 2019-11-05 LAB — PREGNANCY, URINE: Preg Test, Ur: NEGATIVE

## 2019-11-05 MED ORDER — HYDROCODONE-ACETAMINOPHEN 5-325 MG PO TABS
1.0000 | ORAL_TABLET | Freq: Once | ORAL | Status: AC
  Administered 2019-11-05: 18:00:00 1 via ORAL
  Filled 2019-11-05: qty 1

## 2019-11-05 MED ORDER — POTASSIUM CHLORIDE CRYS ER 20 MEQ PO TBCR: 20.0000 meq | EXTENDED_RELEASE_TABLET | Freq: Two times a day (BID) | ORAL | 0 refills | Status: DC

## 2019-11-05 MED ORDER — IOHEXOL 300 MG/ML  SOLN
100.0000 mL | Freq: Once | INTRAMUSCULAR | Status: AC | PRN
  Administered 2019-11-05: 100 mL via INTRAVENOUS

## 2019-11-05 MED ORDER — METRONIDAZOLE 500 MG PO TABS
500.0000 mg | ORAL_TABLET | Freq: Once | ORAL | Status: AC
  Administered 2019-11-05: 18:00:00 500 mg via ORAL
  Filled 2019-11-05: qty 1

## 2019-11-05 MED ORDER — SODIUM CHLORIDE 0.9 % IV BOLUS
1000.0000 mL | Freq: Once | INTRAVENOUS | Status: AC
  Administered 2019-11-05: 1000 mL via INTRAVENOUS

## 2019-11-05 MED ORDER — FLUCONAZOLE 150 MG PO TABS: 150.0000 mg | ORAL_TABLET | Freq: Once | ORAL | 0 refills | Status: AC

## 2019-11-05 MED ORDER — METRONIDAZOLE 500 MG PO TABS: 500.0000 mg | ORAL_TABLET | Freq: Three times a day (TID) | ORAL | 0 refills | Status: DC

## 2019-11-05 MED ORDER — POTASSIUM CHLORIDE CRYS ER 20 MEQ PO TBCR
40.0000 meq | EXTENDED_RELEASE_TABLET | Freq: Once | ORAL | Status: AC
  Administered 2019-11-05: 40 meq via ORAL
  Filled 2019-11-05: qty 2

## 2019-11-05 MED ORDER — HYDROMORPHONE HCL 1 MG/ML IJ SOLN
1.0000 mg | Freq: Once | INTRAMUSCULAR | Status: AC
  Administered 2019-11-05: 1 mg via INTRAVENOUS
  Filled 2019-11-05: qty 1

## 2019-11-05 MED ORDER — HYDROCODONE-ACETAMINOPHEN 5-325 MG PO TABS: 1.0000 | ORAL_TABLET | ORAL | 0 refills | Status: DC | PRN

## 2019-11-05 NOTE — ED Triage Notes (Signed)
LLQ abd pain x 2 days. Reports hourly episodes of diarrhea.

## 2019-11-05 NOTE — ED Notes (Signed)
Pt given d/c instructions regarding stool sample and where to bring it back , pt given hat and cup with stickers and d/c instructions regarding antibiotics . Pt requested and received rx for yeast infection

## 2019-11-05 NOTE — ED Notes (Signed)
Pt on monitor 

## 2019-11-05 NOTE — ED Provider Notes (Signed)
[facility] EMERGENCY DEPARTMENT Provider Note   CSN: [id number] Arrival date & time: 11/05/19  1533     History Chief Complaint  Patient presents with  . Abdominal Pain    Erika David is a 44 y.o. female.  HPI 44 year old female presents with left lower quadrant abdominal pain and diarrhea.  Started 2 days ago.  Is having numerous diarrheal bowel movements.  Sometimes think she might of seen blood or at least dark stool.  She did start taking Pepto-Bismol recently.  No fevers or vomiting though she has had some nausea.  Pain is severe.  Seems to come and go and is not always related to the diarrhea itself.  Eating does make it worse.  She recently used antibiotics about 1 month ago for dental problems.  Past Medical History:  Diagnosis Date  . Abnormal Pap smear     LEEP?  LAST PAP 05/2009  . Anemia 01/04/2012  . Chlamydia   . H/O candidiasis   . H/O dysmenorrhea 03/13/02  . H/O gonorrhea    treated  . H/O menorrhagia 07/09/02  . Infection 2006   GONORHRREA  . Infection 2006   CHLAMYDIA  . Infection    FREQ YEAST  . Pelvic injury AS TEEN   SPORTS RELATED  . Pelvic pain 07/11/10  . Trichomonas   . Yeast infection     Patient Active Problem List   Diagnosis Date Noted  . Vaginal discharge 05/14/2016  . Well adult on routine health check 05/14/2016  . S/P cesarean section 04/12/2012  . Sterilization 01/04/2012  . Overweight(278.02) 01/04/2012  . Anemia 01/04/2012  . Seasonal allergies 01/04/2012    Past Surgical History:  Procedure Laterality Date  . CESAREAN SECTION  04/11/2012   Procedure: CESAREAN SECTION;  Surgeon: Delice Lesch, MD;  Location: [facility];  Service: Gynecology;  Laterality: N/A;  twins  . GYNECOLOGIC CRYOSURGERY    . TUBAL LIGATION    . WISDOM TOOTH EXTRACTION  2007     OB History    Gravida  6   Para  4   Term  4   Preterm      AB  2   Living  5     SAB  1   TAB  1   Ectopic      Multiple  1   Live Births  5            Family History  Problem Relation Age of Onset  . Hypertension Mother   . Cancer Mother        BREAST  . Thyroid disease Mother   . Diabetes Father   . Hypertension Sister   . Lupus Maternal Aunt   . Liver disease Sister   . Asthma Cousin   . Hypertension Maternal Grandmother   . Diabetes Maternal Grandmother   . Heart disease Maternal Grandfather   . Stroke Maternal Grandfather   . Alcohol abuse Maternal Aunt   . Drug abuse Maternal Aunt   . Anesthesia problems Neg Hx     Social History   Tobacco Use  . Smoking status: Never Smoker  . Smokeless tobacco: Never Used  Substance Use Topics  . Alcohol use: No    Comment: occasional alcohol  . Drug use: No    Home Medications Prior to Admission medications   Medication Sig Start Date End Date Taking? Authorizing Provider  azithromycin (ZITHROMAX) 250 MG tablet Take 1 tablet (250 mg total) by mouth daily.  Take first 2 tablets together, then 1 every day until finished. Patient not taking: Reported on 06/19/2019 06/11/17   Robyn Haber, MD  cetirizine (ZYRTEC) 10 MG tablet Take 10 mg by mouth daily.    [provider]  fluconazole (DIFLUCAN) 150 MG tablet Take 1 tablet (150 mg total) by mouth once for 1 dose. 11/05/19 11/05/19  Sherwood Gambler, MD  HYDROcodone-acetaminophen (NORCO) 5-325 MG tablet Take 1 tablet by mouth every 4 (four) hours as needed for severe pain. 11/05/19   Sherwood Gambler, MD  HYDROcodone-homatropine (HYDROMET) 5-1.5 MG/5ML syrup Take 5 mLs by mouth every 6 (six) hours as needed for cough. Patient not taking: Reported on 06/19/2019 06/11/17   Robyn Haber, MD  metroNIDAZOLE (FLAGYL) 500 MG tablet Take 1 tablet (500 mg total) by mouth 3 (three) times daily for 10 days. 11/05/19 11/15/19  Sherwood Gambler, MD  ondansetron (ZOFRAN ODT) 4 MG disintegrating tablet Take 1 tablet (4 mg total) by mouth every 6 (six) hours as needed for nausea or vomiting. 06/19/19   Ward, Delice Bison, DO  potassium  chloride SA (KLOR-CON) 20 MEQ tablet Take 1 tablet (20 mEq total) by mouth 2 (two) times daily for 5 days. 11/05/19 11/10/19  Sherwood Gambler, MD    Allergies    Dust mite extract, Kiwi extract, and Pollen extract  Review of Systems   Review of Systems  Constitutional: Negative for fever.  Gastrointestinal: Positive for abdominal pain, diarrhea and nausea. Negative for vomiting.  Genitourinary: Positive for dysuria (started having dysuria today). Negative for hematuria.  All other systems reviewed and are negative.   Physical Exam Updated Vital Signs BP 129/86 (BP Location: Right Arm)   Pulse 78   Temp 98.1 F (36.7 C) (Oral)   Resp 18   Ht 5' 1.5" (1.562 m)   Wt 85.7 kg   LMP 10/01/2019   SpO2 98%   BMI 35.13 kg/m   Physical Exam Vitals and nursing note reviewed.  Constitutional:      General: She is not in acute distress.    Appearance: She is well-developed. She is not ill-appearing or diaphoretic.  HENT:     Head: Normocephalic and atraumatic.     Right Ear: External ear normal.     Left Ear: External ear normal.     Nose: Nose normal.  Eyes:     General:        Right eye: No discharge.        Left eye: No discharge.  Cardiovascular:     Rate and Rhythm: Normal rate and regular rhythm.     Heart sounds: Normal heart sounds.  Pulmonary:     Effort: Pulmonary effort is normal.     Breath sounds: Normal breath sounds.  Abdominal:     Palpations: Abdomen is soft.     Tenderness: There is abdominal tenderness in the left lower quadrant.  Skin:    General: Skin is warm and dry.  Neurological:     Mental Status: She is alert.  Psychiatric:        Mood and Affect: Mood is not anxious.     ED Results / Procedures / Treatments   Labs (all labs ordered are listed, but only abnormal results are displayed) Labs Reviewed  COMPREHENSIVE METABOLIC PANEL - Abnormal; Notable for the following components:      Result Value   Potassium 2.9 (*)    Glucose, Bld 102 (*)      Total Bilirubin 0.2 (*)  All other components within normal limits  URINALYSIS, ROUTINE W REFLEX MICROSCOPIC - Abnormal; Notable for the following components:   Specific Gravity, Urine >1.030 (*)    All other components within normal limits  CBC WITH DIFFERENTIAL/PLATELET - Abnormal; Notable for the following components:   Hemoglobin 9.7 (*)    HCT 32.2 (*)    MCV 72.0 (*)    MCH 21.7 (*)    RDW 18.4 (*)    All other components within normal limits  GI PATHOGEN PANEL BY PCR, STOOL  LIPASE, BLOOD  PREGNANCY, URINE  MAGNESIUM    EKG EKG Interpretation  Date/Time:  Thursday November 05 2019 17:10:23 EST Ventricular Rate:  75 PR Interval:    QRS Duration: 78 QT Interval:  389 QTC Calculation: 435 R Axis:   38 Text Interpretation: Sinus rhythm Borderline repolarization abnormality Baseline wander in lead(s) V5 V6 Confirmed by Sherwood Gambler [phone]) on 11/05/2019 5:12:46 PM   Radiology CT ABDOMEN PELVIS W CONTRAST  Result Date: 11/05/2019 CLINICAL DATA:  Left lower quadrant abdominal pain and diarrhea. EXAM: CT ABDOMEN AND PELVIS WITH CONTRAST TECHNIQUE: Multidetector CT imaging of the abdomen and pelvis was performed using the standard protocol following bolus administration of intravenous contrast. CONTRAST:  115m OMNIPAQUE IOHEXOL 300 MG/ML  SOLN COMPARISON:  CT scan from 2017 FINDINGS: Lower chest: The lung bases are clear of an acute process. Minimal dependent atelectasis. The heart is normal in size. No pericardial effusion. Hepatobiliary: No focal hepatic lesions or intrahepatic biliary dilatation. The gallbladder is normal. No common bile duct dilatation. The portal and hepatic veins are patent. Pancreas: No mass, inflammation or ductal dilatation. Spleen: Normal size. No focal lesions. Adrenals/Urinary Tract: The adrenal glands and kidneys are normal. The bladder is normal. Stomach/Bowel: The stomach, duodenum, small bowel and terminal ileum are normal. The appendix is  normal. There is a diffuse and fairly marked inflammatory or infectious colitis involving the cecum, the entire ascending colon, transverse colon and descending colon. I do not see any obvious involvement of the rectum or sigmoid colon. No CT findings for diverticulitis. Vascular/Lymphatic: The aorta and branch vessels are patent. No atherosclerotic calcifications. The major venous structures are patent. Small scattered retroperitoneal and mesenteric lymph nodes but no mass or overt adenopathy. Reproductive: The uterus is unremarkable. Both ovaries are normal except for small right renal cysts. Other: No pelvic adenopathy or free pelvic fluid collections. No inguinal mass or adenopathy. Musculoskeletal: No significant bony findings. IMPRESSION: 1. Diffuse and fairly marked inflammatory or infectious colitis. 2. No other significant abdominal/pelvic findings. Electronically Signed   By: PMarijo SanesM.D.   On: 11/05/2019 17:12    Procedures Procedures (including critical care time)  Medications Ordered in ED Medications  sodium chloride 0.9 % bolus 1,000 mL ( Intravenous Stopped 11/05/19 1703)  HYDROmorphone (DILAUDID) injection 1 mg (1 mg Intravenous Given 11/05/19 1610)  iohexol (OMNIPAQUE) 300 MG/ML solution 100 mL (100 mLs Intravenous Contrast Given 11/05/19 1655)  potassium chloride SA (KLOR-CON) CR tablet 40 mEq (40 mEq Oral Given 11/05/19 1723)  HYDROcodone-acetaminophen (NORCO/VICODIN) 5-325 MG per tablet 1 tablet (1 tablet Oral Given 11/05/19 1805)  metroNIDAZOLE (FLAGYL) tablet 500 mg (500 mg Oral Given 11/05/19 1805)    ED Course  I have reviewed the triage vital signs and the nursing notes.  Pertinent labs & imaging results that were available during my care of the patient were reviewed by me and considered in my medical decision making (see chart for details).    MDM Rules/Calculators/A&P  CT shows diffuse colitis.  Her labs are reassuring including normal WBC,  stable anemia.  She does have moderate hypokalemia, likely from the diarrhea.  Her vital signs are stable.  Her pain is well controlled after a dose of pain medicine.  I discussed with Dr. Carlean Purl.  He recommends covering for C. difficile given antibiotics in the last month and the pancolitis.  He recommends Flagyl only for now.  Otherwise, would only admit if she otherwise clinically needs admission.  Given her well appearance, stable vitals, etc., I think she can be treated as an outpatient follow-up with her PCP.  We discussed return precautions. Final Clinical Impression(s) / ED Diagnoses Final diagnoses:  Colitis  Hypokalemia    Rx / DC Orders ED Discharge Orders         Ordered    HYDROcodone-acetaminophen (NORCO) 5-325 MG tablet  Every 4 hours PRN     11/05/19 1759    metroNIDAZOLE (FLAGYL) 500 MG tablet  3 times daily     11/05/19 1759    potassium chloride SA (KLOR-CON) 20 MEQ tablet  2 times daily     11/05/19 1759    fluconazole (DIFLUCAN) 150 MG tablet   Once     11/05/19 1816           Sherwood Gambler, MD 11/05/19 [phone]

## 2019-11-05 NOTE — Telephone Encounter (Signed)
Asked for advice re: should patient be hospitalized due to markedly inflamed colon on CT  Dr. Regenia Skeeter indicated patients clinical status not toxic and mildly tender on abdominal exam, no fever.  Agreed w/ w/u for infectious colitis and f/u PCP and referral to GI if persistent issues  Did not think she needed to be admitted on the basis of CT scan findings and labs

## 2019-11-05 NOTE — Discharge Instructions (Addendum)
If you develop worsening, continued, or recurrent abdominal pain, uncontrolled vomiting, fever, chest or back pain, or any other new/concerning symptoms then return to the ER for evaluation.  

## 2019-11-05 NOTE — ED Notes (Signed)
Called [facility] for consult.   Called ON-call line.  Will page out to oncall physician to return page.

## 2019-11-07 LAB — GI PATHOGEN PANEL BY PCR, STOOL

## 2019-11-08 ENCOUNTER — Encounter (HOSPITAL_BASED_OUTPATIENT_CLINIC_OR_DEPARTMENT_OTHER): Payer: Self-pay

## 2019-11-08 ENCOUNTER — Other Ambulatory Visit: Payer: Self-pay

## 2019-11-08 ENCOUNTER — Inpatient Hospital Stay (HOSPITAL_BASED_OUTPATIENT_CLINIC_OR_DEPARTMENT_OTHER)
Admission: EM | Admit: 2019-11-08 | Discharge: 2019-11-15 | DRG: 392 | Disposition: A | Discharge status: Home / Self Care | Payer: BC Managed Care – PPO | Attending: Internal Medicine | Admitting: Internal Medicine

## 2019-11-08 DIAGNOSIS — D500 Iron deficiency anemia secondary to blood loss (chronic): Secondary | ICD-10-CM | POA: Diagnosis not present

## 2019-11-08 DIAGNOSIS — K529 Noninfective gastroenteritis and colitis, unspecified: Secondary | ICD-10-CM

## 2019-11-08 DIAGNOSIS — E860 Dehydration: Secondary | ICD-10-CM | POA: Diagnosis not present

## 2019-11-08 DIAGNOSIS — R109 Unspecified abdominal pain: Secondary | ICD-10-CM

## 2019-11-08 DIAGNOSIS — Z8249 Family history of ischemic heart disease and other diseases of the circulatory system: Secondary | ICD-10-CM | POA: Diagnosis not present

## 2019-11-08 DIAGNOSIS — R197 Diarrhea, unspecified: Secondary | ICD-10-CM | POA: Diagnosis not present

## 2019-11-08 DIAGNOSIS — Z6835 Body mass index (BMI) 35.0-35.9, adult: Secondary | ICD-10-CM

## 2019-11-08 DIAGNOSIS — Z79899 Other long term (current) drug therapy: Secondary | ICD-10-CM

## 2019-11-08 DIAGNOSIS — K5100 Ulcerative (chronic) pancolitis without complications: Secondary | ICD-10-CM | POA: Diagnosis not present

## 2019-11-08 DIAGNOSIS — E876 Hypokalemia: Secondary | ICD-10-CM | POA: Diagnosis not present

## 2019-11-08 DIAGNOSIS — K50019 Crohn's disease of small intestine with unspecified complications: Secondary | ICD-10-CM | POA: Diagnosis not present

## 2019-11-08 DIAGNOSIS — Z823 Family history of stroke: Secondary | ICD-10-CM | POA: Diagnosis not present

## 2019-11-08 DIAGNOSIS — D649 Anemia, unspecified: Secondary | ICD-10-CM | POA: Diagnosis present

## 2019-11-08 DIAGNOSIS — Z825 Family history of asthma and other chronic lower respiratory diseases: Secondary | ICD-10-CM

## 2019-11-08 DIAGNOSIS — Z20822 Contact with and (suspected) exposure to covid-19: Secondary | ICD-10-CM | POA: Diagnosis not present

## 2019-11-08 DIAGNOSIS — Z809 Family history of malignant neoplasm, unspecified: Secondary | ICD-10-CM | POA: Diagnosis not present

## 2019-11-08 DIAGNOSIS — E739 Lactose intolerance, unspecified: Secondary | ICD-10-CM | POA: Diagnosis not present

## 2019-11-08 DIAGNOSIS — K640 First degree hemorrhoids: Secondary | ICD-10-CM | POA: Diagnosis present

## 2019-11-08 DIAGNOSIS — E669 Obesity, unspecified: Secondary | ICD-10-CM | POA: Diagnosis not present

## 2019-11-08 DIAGNOSIS — K52832 Lymphocytic colitis: Principal | ICD-10-CM | POA: Diagnosis present

## 2019-11-08 DIAGNOSIS — N920 Excessive and frequent menstruation with regular cycle: Secondary | ICD-10-CM | POA: Diagnosis present

## 2019-11-08 DIAGNOSIS — R933 Abnormal findings on diagnostic imaging of other parts of digestive tract: Secondary | ICD-10-CM

## 2019-11-08 DIAGNOSIS — D509 Iron deficiency anemia, unspecified: Secondary | ICD-10-CM | POA: Diagnosis not present

## 2019-11-08 DIAGNOSIS — R531 Weakness: Secondary | ICD-10-CM

## 2019-11-08 DIAGNOSIS — Z8349 Family history of other endocrine, nutritional and metabolic diseases: Secondary | ICD-10-CM | POA: Diagnosis not present

## 2019-11-08 DIAGNOSIS — K6389 Other specified diseases of intestine: Secondary | ICD-10-CM | POA: Diagnosis not present

## 2019-11-08 DIAGNOSIS — R1033 Periumbilical pain: Secondary | ICD-10-CM | POA: Diagnosis not present

## 2019-11-08 LAB — COMPREHENSIVE METABOLIC PANEL
ALT: 12 U/L (ref 0–44)
AST: 19 U/L (ref 15–41)
Albumin: 4.1 g/dL (ref 3.5–5.0)
Alkaline Phosphatase: 99 U/L (ref 38–126)
Anion gap: 8 (ref 5–15)
BUN: 5 mg/dL — ABNORMAL LOW (ref 6–20)
CO2: 24 mmol/L (ref 22–32)
Calcium: 9.1 mg/dL (ref 8.9–10.3)
Chloride: 103 mmol/L (ref 98–111)
Creatinine, Ser: 0.83 mg/dL (ref 0.44–1.00)
GFR calc Af Amer: >60 mL/min (ref >60)
GFR calc non Af Amer: >60 mL/min (ref >60)
Glucose, Bld: 91 mg/dL (ref 70–99)
Potassium: 3.8 mmol/L (ref 3.5–5.1)
Sodium: 135 mmol/L (ref 135–145)
Total Bilirubin: 0.3 mg/dL (ref 0.3–1.2)
Total Protein: 7.8 g/dL (ref 6.5–8.1)

## 2019-11-08 LAB — CBC
HCT: 34.4 % — ABNORMAL LOW (ref 36.0–46.0)
Hemoglobin: 10.3 g/dL — ABNORMAL LOW (ref 12.0–15.0)
MCH: 21.5 pg — ABNORMAL LOW (ref 26.0–34.0)
MCHC: 29.9 g/dL — ABNORMAL LOW (ref 30.0–36.0)
MCV: 72 fL — ABNORMAL LOW (ref 80.0–100.0)
Platelets: 374 10*3/uL (ref 150–400)
RBC: 4.78 MIL/uL (ref 3.87–5.11)
RDW: 18.3 % — ABNORMAL HIGH (ref 11.5–15.5)
WBC: 7.1 10*3/uL (ref 4.0–10.5)
nRBC: 0 % (ref 0.0–0.2)

## 2019-11-08 LAB — LACTIC ACID, PLASMA: Lactic Acid, Venous: 1 mmol/L (ref 0.5–1.9)

## 2019-11-08 LAB — LIPASE, BLOOD: Lipase: 17 U/L (ref 11–51)

## 2019-11-08 MED ORDER — ACETAMINOPHEN 325 MG PO TABS
650.0000 mg | ORAL_TABLET | Freq: Four times a day (QID) | ORAL | Status: DC | PRN
  Administered 2019-11-09 – 2019-11-11 (×7): 650 mg via ORAL
  Filled 2019-11-08 (×7): qty 2

## 2019-11-08 MED ORDER — SODIUM CHLORIDE 0.9 % IV SOLN: Freq: Once | INTRAVENOUS | Status: AC

## 2019-11-08 MED ORDER — ENOXAPARIN SODIUM 40 MG/0.4ML ~~LOC~~ SOLN
40.0000 mg | Freq: Every day | SUBCUTANEOUS | Status: DC
  Administered 2019-11-09 – 2019-11-10 (×3): 40 mg via SUBCUTANEOUS
  Filled 2019-11-08 (×3): qty 0.4

## 2019-11-08 MED ORDER — SODIUM CHLORIDE 0.9 % IV BOLUS
500.0000 mL | Freq: Once | INTRAVENOUS | Status: AC
  Administered 2019-11-08: 500 mL via INTRAVENOUS

## 2019-11-08 MED ORDER — MORPHINE SULFATE (PF) 4 MG/ML IV SOLN
4.0000 mg | Freq: Once | INTRAVENOUS | Status: AC
  Administered 2019-11-08: 4 mg via INTRAVENOUS
  Filled 2019-11-08: qty 1

## 2019-11-08 MED ORDER — ONDANSETRON 4 MG PO TBDP
4.0000 mg | ORAL_TABLET | Freq: Once | ORAL | Status: AC | PRN
  Administered 2019-11-08: 13:00:00 4 mg via ORAL
  Filled 2019-11-08: qty 1

## 2019-11-08 MED ORDER — ONDANSETRON HCL 4 MG/2ML IJ SOLN: 4.0000 mg | Freq: Four times a day (QID) | INTRAMUSCULAR | Status: DC | PRN

## 2019-11-08 MED ORDER — ONDANSETRON HCL 4 MG PO TABS
4.0000 mg | ORAL_TABLET | Freq: Four times a day (QID) | ORAL | Status: DC | PRN
  Administered 2019-11-13: 4 mg via ORAL
  Filled 2019-11-08: qty 1

## 2019-11-08 MED ORDER — ACETAMINOPHEN 650 MG RE SUPP: 650.0000 mg | Freq: Four times a day (QID) | RECTAL | Status: DC | PRN

## 2019-11-08 NOTE — ED Notes (Signed)
Spoke  with Ruby at [facility] regarding hospitalist at [facility]

## 2019-11-08 NOTE — ED Provider Notes (Signed)
Emergency Department Provider Note   I have reviewed the triage vital signs and the nursing notes.   HISTORY  Chief Complaint No chief complaint on file.   HPI [name] is a 44 y.o. female past medical history reviewed below the emergency department with continued abdominal pain and multiple episodes of nonbloody diarrhea.  Has had 5 days of diarrhea symptoms with diffuse nonspecific abdominal pain.  Presented to the emergency department on day two of illness initially with labs and CT imaging.  Reviewed and showed pancolitis but labs and patient's presentation were reassuring.  She was started on Flagyl and discharged home.  Stool was sent for culture but the lab suggest is a sample.  Had taking antibiotics earlier in the month following a dental extraction.  He has been taking the Flagyl at home with no improvement in symptoms.  She denies fevers or change in abdominal pain.  No blood.  She become nauseated today but did not have vomiting.  Past Medical History:  Diagnosis Date  . Abnormal Pap smear     LEEP?  LAST PAP 05/2009  . Anemia 01/04/2012  . Chlamydia   . H/O candidiasis   . H/O dysmenorrhea 03/13/02  . H/O gonorrhea    treated  . H/O menorrhagia 07/09/02  . Infection 2006   GONORHRREA  . Infection 2006   CHLAMYDIA  . Infection    FREQ YEAST  . Pelvic injury AS TEEN   SPORTS RELATED  . Pelvic pain 07/11/10  . Trichomonas   . Yeast infection     Patient Active Problem List   Diagnosis Date Noted  . Generalized weakness 11/09/2019  . Pancolitis ([place]) 11/08/2019  . Vaginal discharge 05/14/2016  . Well adult on routine health check 05/14/2016  . S/P cesarean section 04/12/2012  . Sterilization 01/04/2012  . Overweight(278.02) 01/04/2012  . Anemia 01/04/2012  . Seasonal allergies 01/04/2012    Past Surgical History:  Procedure Laterality Date  . CESAREAN SECTION  04/11/2012   Procedure: CESAREAN SECTION;  Surgeon: Delice Lesch, MD;  Location: [facility];  Service: Gynecology;  Laterality: N/A;  twins  . GYNECOLOGIC CRYOSURGERY    . TUBAL LIGATION    . WISDOM TOOTH EXTRACTION  2007    Allergies Dust mite extract, Kiwi extract, and Pollen extract  Family History  Problem Relation Age of Onset  . Hypertension Mother   . Cancer Mother        BREAST  . Thyroid disease Mother   . Diabetes Father   . Hypertension Sister   . Lupus Maternal Aunt   . Liver disease Sister   . Asthma Cousin   . Hypertension Maternal Grandmother   . Diabetes Maternal Grandmother   . Heart disease Maternal Grandfather   . Stroke Maternal Grandfather   . Alcohol abuse Maternal Aunt   . Drug abuse Maternal Aunt   . Anesthesia problems Neg Hx     Social History Social History   Tobacco Use  . Smoking status: Never Smoker  . Smokeless tobacco: Never Used  Substance Use Topics  . Alcohol use: No    Comment: occasional alcohol  . Drug use: No    Review of Systems  Constitutional: No fever/chills Eyes: No visual changes. ENT: No sore throat. Cardiovascular: Denies chest pain. Respiratory: Denies shortness of breath. Gastrointestinal: Positive abdominal pain. Positive nausea, no vomiting.  Positive diarrhea.  No constipation. Genitourinary: Negative for dysuria. Musculoskeletal: Negative for back pain. Skin: Negative for rash.  Neurological: Negative for headaches, focal weakness or numbness.  10-point ROS otherwise negative.  ____________________________________________   PHYSICAL EXAM:  VITAL SIGNS: ED Triage Vitals  Enc Vitals Group     BP 11/08/19 1319 (!) 142/98     Pulse Rate 11/08/19 1319 85     Resp 11/08/19 1319 20     Temp 11/08/19 1319 98.2 F (36.8 C)     Temp Source 11/08/19 1319 Oral     SpO2 11/08/19 1319 99 %     Weight 11/08/19 1317 190 lb (86.2 kg)     Height 11/08/19 1317 5' 1.5" (1.562 m)   Constitutional: Alert and oriented. Well appearing and in no acute distress. Eyes: Conjunctivae are normal. Head:  Atraumatic. Nose: No congestion/rhinnorhea. Mouth/Throat: Mucous membranes are moist.   Neck: No stridor.   Cardiovascular: Normal rate, regular rhythm. Good peripheral circulation. Grossly normal heart sounds.   Respiratory: Normal respiratory effort.  No retractions. Lungs CTAB. Gastrointestinal: Soft with mild diffuse tenderness without focal tenderness or peritonitis. No distention.  Musculoskeletal: No gross deformities of extremities. Neurologic:  Normal speech and language.  Skin:  Skin is warm, dry and intact. No rash noted.   ____________________________________________   LABS (all labs ordered are listed, but only abnormal results are displayed)  Labs Reviewed  COMPREHENSIVE METABOLIC PANEL - Abnormal; Notable for the following components:      Result Value   BUN 5 (*)    All other components within normal limits  CBC - Abnormal; Notable for the following components:   Hemoglobin 10.3 (*)    HCT 34.4 (*)    MCV 72.0 (*)    MCH 21.5 (*)    MCHC 29.9 (*)    RDW 18.3 (*)    All other components within normal limits  COMPREHENSIVE METABOLIC PANEL - Abnormal; Notable for the following components:   BUN <5 (*)    Calcium 8.6 (*)    Total Bilirubin 0.2 (*)    All other components within normal limits  CBC - Abnormal; Notable for the following components:   Hemoglobin 9.4 (*)    HCT 32.6 (*)    MCV 74.1 (*)    MCH 21.4 (*)    MCHC 28.8 (*)    RDW 18.5 (*)    All other components within normal limits  SARS CORONAVIRUS 2 (TAT 6-24 HRS)  C DIFFICILE QUICK SCREEN W PCR REFLEX  GI PATHOGEN PANEL BY PCR, STOOL  LIPASE, BLOOD  LACTIC ACID, PLASMA  HIV ANTIBODY (ROUTINE TESTING W REFLEX)  LACTIC ACID, PLASMA  IRON AND TIBC  FERRITIN   ____________________________________________  RADIOLOGY  DG ABD ACUTE 2+V W 1V CHEST  Result Date: 11/09/2019 CLINICAL DATA:  Umbilical abdominal pain and diarrhea for 4 days. EXAM: DG ABDOMEN ACUTE W/ 1V CHEST COMPARISON:   11/05/2019 FINDINGS: There is no evidence of dilated bowel loops or free intraperitoneal air. No radiopaque calculi or other significant radiographic abnormality is seen. Heart size and mediastinal contours are within normal limits. Both lungs are clear. IMPRESSION: Negative abdominal radiographs.  No acute cardiopulmonary disease. Electronically Signed   By: Kerby Moors M.D.   On: 11/09/2019 09:44    ____________________________________________   PROCEDURES  Procedure(s) performed:   Procedures  None ____________________________________________   INITIAL IMPRESSION / ASSESSMENT AND PLAN / ED COURSE  Pertinent labs & imaging results that were available during my care of the patient were reviewed by me and considered in my medical decision making (see chart for details).  Patient returns to the emergency department with continued diarrhea and some nausea.  Pancolitis on CT scan from 3 days ago.  No focal tenderness on my exam severe tooth socket message imaging.  White count remains normal.  No acute kidney injury.  Lactate, IV fluids, and will attempt to obtain stool studies.   Labs unchanged. Patient with continued pain in the ED although not worse than before. Plan for liquid diet, IVF, and symptom mgmt pending stool sample. Hold abx until C diff status can be confirmed.   Discussed patient's case with [facility] to request admission. Patient and family (if present) updated with plan. Care transferred to [facility] service.  I reviewed all nursing notes, vitals, pertinent old records, EKGs, labs, imaging (as available).  ____________________________________________  FINAL CLINICAL IMPRESSION(S) / ED DIAGNOSES  Final diagnoses:  Colitis, acute     MEDICATIONS GIVEN DURING THIS VISIT:  Medications  enoxaparin (LOVENOX) injection 40 mg (40 mg Subcutaneous Given 11/09/19 0016)  acetaminophen (TYLENOL) tablet 650 mg (650 mg Oral Given 11/09/19 0625)    Or  acetaminophen (TYLENOL)  suppository 650 mg ( Rectal See Alternative 11/09/19 0625)  ondansetron (ZOFRAN) tablet 4 mg (has no administration in time range)    Or  ondansetron (ZOFRAN) injection 4 mg (has no administration in time range)  loratadine (CLARITIN) tablet 10 mg (10 mg Oral Given 11/09/19 0205)  dicyclomine (BENTYL) capsule 10 mg (has no administration in time range)  ondansetron (ZOFRAN-ODT) disintegrating tablet 4 mg (4 mg Oral Given 11/08/19 1325)  sodium chloride 0.9 % bolus 500 mL (0 mLs Intravenous Stopped 11/08/19 1741)  0.9 %  sodium chloride infusion ( Intravenous Stopped 11/09/19 0757)  morphine 4 MG/ML injection 4 mg (4 mg Intravenous Given 11/08/19 1806)  0.9 %  sodium chloride infusion ( Intravenous Stopped 11/09/19 0756)  0.9 %  sodium chloride infusion ( Intravenous Stopped 11/09/19 0756)    Note:  This document was prepared using Dragon voice recognition software and may include unintentional dictation errors.  Nanda Quinton, MD, [facility]    Eliazer Hemphill, W[facility], MD 11/09/19 1115

## 2019-11-08 NOTE — ED Triage Notes (Signed)
Pt c/o abd pain. Pt was seen here on Thursday and dx with colitis. Pt states she is still not feeling well so she is back for a re-check. Pt was called today by our ED to advise here we need another stool sample.

## 2019-11-08 NOTE — ED Notes (Signed)
ED Provider at bedside. 

## 2019-11-08 NOTE — Progress Notes (Addendum)
Patient be transferred to [facility].  This is a 44 year old female who presents for her second visit.  Her first visit was on the 25th.  At that time she has 2 days of diarrhea with abdominal pain.  Her CT scan showed is inflammation/pancolitis.  She was discharged home with p.o. Flagyl.  Stool studies were done but they were rejected for some unknown reason.  Her labs at that time are good.    She represents to [facility] today with continued abdominal pain.  The labs again are okay however patient appears dehydrated and more symptomatic.  Stool cultures have been sent.  She has not received any additional antibiotics.

## 2019-11-09 ENCOUNTER — Observation Stay (HOSPITAL_COMMUNITY): Payer: BC Managed Care – PPO

## 2019-11-09 DIAGNOSIS — Z6835 Body mass index (BMI) 35.0-35.9, adult: Secondary | ICD-10-CM | POA: Diagnosis not present

## 2019-11-09 DIAGNOSIS — D649 Anemia, unspecified: Secondary | ICD-10-CM | POA: Diagnosis not present

## 2019-11-09 DIAGNOSIS — E876 Hypokalemia: Secondary | ICD-10-CM | POA: Diagnosis not present

## 2019-11-09 DIAGNOSIS — K52832 Lymphocytic colitis: Secondary | ICD-10-CM | POA: Diagnosis not present

## 2019-11-09 DIAGNOSIS — Z8349 Family history of other endocrine, nutritional and metabolic diseases: Secondary | ICD-10-CM | POA: Diagnosis not present

## 2019-11-09 DIAGNOSIS — K6389 Other specified diseases of intestine: Secondary | ICD-10-CM | POA: Diagnosis not present

## 2019-11-09 DIAGNOSIS — Z20822 Contact with and (suspected) exposure to covid-19: Secondary | ICD-10-CM | POA: Diagnosis present

## 2019-11-09 DIAGNOSIS — E669 Obesity, unspecified: Secondary | ICD-10-CM | POA: Diagnosis present

## 2019-11-09 DIAGNOSIS — K5100 Ulcerative (chronic) pancolitis without complications: Secondary | ICD-10-CM | POA: Diagnosis present

## 2019-11-09 DIAGNOSIS — R1033 Periumbilical pain: Secondary | ICD-10-CM | POA: Diagnosis not present

## 2019-11-09 DIAGNOSIS — Z823 Family history of stroke: Secondary | ICD-10-CM | POA: Diagnosis not present

## 2019-11-09 DIAGNOSIS — Z809 Family history of malignant neoplasm, unspecified: Secondary | ICD-10-CM | POA: Diagnosis not present

## 2019-11-09 DIAGNOSIS — R531 Weakness: Secondary | ICD-10-CM

## 2019-11-09 DIAGNOSIS — Z79899 Other long term (current) drug therapy: Secondary | ICD-10-CM | POA: Diagnosis not present

## 2019-11-09 DIAGNOSIS — Z8249 Family history of ischemic heart disease and other diseases of the circulatory system: Secondary | ICD-10-CM | POA: Diagnosis not present

## 2019-11-09 DIAGNOSIS — E860 Dehydration: Secondary | ICD-10-CM | POA: Diagnosis present

## 2019-11-09 DIAGNOSIS — D509 Iron deficiency anemia, unspecified: Secondary | ICD-10-CM | POA: Diagnosis not present

## 2019-11-09 DIAGNOSIS — R933 Abnormal findings on diagnostic imaging of other parts of digestive tract: Secondary | ICD-10-CM | POA: Diagnosis not present

## 2019-11-09 DIAGNOSIS — N920 Excessive and frequent menstruation with regular cycle: Secondary | ICD-10-CM | POA: Diagnosis present

## 2019-11-09 DIAGNOSIS — K50019 Crohn's disease of small intestine with unspecified complications: Secondary | ICD-10-CM | POA: Diagnosis not present

## 2019-11-09 DIAGNOSIS — R197 Diarrhea, unspecified: Secondary | ICD-10-CM | POA: Diagnosis not present

## 2019-11-09 DIAGNOSIS — D500 Iron deficiency anemia secondary to blood loss (chronic): Secondary | ICD-10-CM | POA: Diagnosis not present

## 2019-11-09 DIAGNOSIS — K640 First degree hemorrhoids: Secondary | ICD-10-CM | POA: Diagnosis not present

## 2019-11-09 DIAGNOSIS — Z825 Family history of asthma and other chronic lower respiratory diseases: Secondary | ICD-10-CM | POA: Diagnosis not present

## 2019-11-09 DIAGNOSIS — E739 Lactose intolerance, unspecified: Secondary | ICD-10-CM | POA: Diagnosis present

## 2019-11-09 LAB — IRON AND TIBC
Iron: 32 ug/dL (ref 28–170)
Saturation Ratios: 7 % — ABNORMAL LOW (ref 10.4–31.8)
TIBC: 467 ug/dL — ABNORMAL HIGH (ref 250–450)
UIBC: 435 ug/dL

## 2019-11-09 LAB — CBC
HCT: 32.6 % — ABNORMAL LOW (ref 36.0–46.0)
Hemoglobin: 9.4 g/dL — ABNORMAL LOW (ref 12.0–15.0)
MCH: 21.4 pg — ABNORMAL LOW (ref 26.0–34.0)
MCHC: 28.8 g/dL — ABNORMAL LOW (ref 30.0–36.0)
MCV: 74.1 fL — ABNORMAL LOW (ref 80.0–100.0)
Platelets: 331 10*3/uL (ref 150–400)
RBC: 4.4 MIL/uL (ref 3.87–5.11)
RDW: 18.5 % — ABNORMAL HIGH (ref 11.5–15.5)
WBC: 6.5 10*3/uL (ref 4.0–10.5)
nRBC: 0 % (ref 0.0–0.2)

## 2019-11-09 LAB — COMPREHENSIVE METABOLIC PANEL
ALT: 14 U/L (ref 0–44)
AST: 20 U/L (ref 15–41)
Albumin: 3.7 g/dL (ref 3.5–5.0)
Alkaline Phosphatase: 85 U/L (ref 38–126)
Anion gap: 9 (ref 5–15)
BUN: <5 mg/dL — ABNORMAL LOW (ref 6–20)
CO2: 23 mmol/L (ref 22–32)
Calcium: 8.6 mg/dL — ABNORMAL LOW (ref 8.9–10.3)
Chloride: 107 mmol/L (ref 98–111)
Creatinine, Ser: 0.74 mg/dL (ref 0.44–1.00)
GFR calc Af Amer: >60 mL/min (ref >60)
GFR calc non Af Amer: >60 mL/min (ref >60)
Glucose, Bld: 95 mg/dL (ref 70–99)
Potassium: 3.6 mmol/L (ref 3.5–5.1)
Sodium: 139 mmol/L (ref 135–145)
Total Bilirubin: 0.2 mg/dL — ABNORMAL LOW (ref 0.3–1.2)
Total Protein: 6.9 g/dL (ref 6.5–8.1)

## 2019-11-09 LAB — SARS CORONAVIRUS 2 (TAT 6-24 HRS): SARS Coronavirus 2: NEGATIVE

## 2019-11-09 LAB — FERRITIN: Ferritin: 8 ng/mL — ABNORMAL LOW (ref 11–307)

## 2019-11-09 LAB — HIV ANTIBODY (ROUTINE TESTING W REFLEX): HIV Screen 4th Generation wRfx: NONREACTIVE

## 2019-11-09 MED ORDER — LORATADINE 10 MG PO TABS
10.0000 mg | ORAL_TABLET | Freq: Every day | ORAL | Status: DC
  Administered 2019-11-09 – 2019-11-15 (×7): 10 mg via ORAL
  Filled 2019-11-09 (×7): qty 1

## 2019-11-09 MED ORDER — LIP MEDEX EX OINT
TOPICAL_OINTMENT | CUTANEOUS | Status: DC | PRN
  Filled 2019-11-09: qty 7

## 2019-11-09 MED ORDER — DICYCLOMINE HCL 10 MG PO CAPS
10.0000 mg | ORAL_CAPSULE | Freq: Three times a day (TID) | ORAL | Status: DC
  Administered 2019-11-09 – 2019-11-15 (×24): 10 mg via ORAL
  Filled 2019-11-09 (×24): qty 1

## 2019-11-09 MED ORDER — SODIUM CHLORIDE 0.9 % IV SOLN: Freq: Once | INTRAVENOUS | Status: AC

## 2019-11-09 NOTE — Consult Note (Addendum)
Consultation  Referring Provider: Dr. Posey Pronto     Primary Care Physician:  Martyn Malay, MD Primary Gastroenterologist: Althia Forts         Reason for Consultation: Colitis             HPI:   Erika David is a 44 y.o. female with a past medical history significant for anemia and others listed below, who presented to the ER as a transfer from [place] on 11/08/2019 with abdominal pain and diarrhea.    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  She presented to [place] and had a CT which showed pancolitis and was given Flagyl to take 3 times a day.  She was also put on the [employer].  She did this over the weekend, but her symptoms did not improve at all, in fact her abdominal pain seemed to worsen and become more frequent.  Tells me that she did bring [place] a stool sample, but "they were not able to use it".  Since being in the hospital now over the past 24 hours she has not eaten and has not had another bowel movement.  Her abdominal pain is now a 6/10 and still generalized and cramping in nature.  Her nausea is mostly controlled with medications here in the hospital.  Associated symptoms include some bright red blood in her stool for 2 or 3 days at the beginning of her symptoms, none since then.    Does report history of recent antibiotic use after tooth extraction.  Tells me she has a history of similar symptoms "now that I am thinking about it".  Has had some severe abdominal cramping while trying to have bowel movements in the past, in fact believes she was in the hospital for  similar about 3 years ago but "they never did a CT" and told me that I was constipated.  Her regular bowel movements are solid and every day.      Denies fever, chills, weight loss or symptoms that awaken her from sleep.  ER course: Hemoglobin 10.3, CBC otherwise normal, stool tests were ordered and are pending; previous CT scan showed pancolitis  Past Medical History:  Diagnosis Date  . Abnormal Pap smear     LEEP?  LAST PAP 05/2009  . Anemia 01/04/2012  . Chlamydia   . H/O candidiasis   . H/O dysmenorrhea 03/13/02  . H/O gonorrhea    treated  . H/O menorrhagia 07/09/02  . Infection 2006   GONORHRREA  . Infection 2006   CHLAMYDIA  . Infection    FREQ YEAST  . Pelvic injury AS TEEN   SPORTS RELATED  . Pelvic pain 07/11/10  . Trichomonas   . Yeast infection     Past Surgical History:  Procedure Laterality Date  . CESAREAN SECTION  04/11/2012   Procedure: CESAREAN SECTION;  Surgeon: Delice Lesch, MD;  Location: [place] [facility];  Service: Gynecology;  Laterality: N/A;  twins  . GYNECOLOGIC CRYOSURGERY    . TUBAL LIGATION    . WISDOM TOOTH  EXTRACTION  2007    Family History  Problem Relation Age of Onset  . Hypertension Mother   . Cancer Mother        BREAST  . Thyroid disease Mother   . Diabetes Father   . Hypertension Sister   . Lupus Maternal Aunt   . Liver disease Sister   . Asthma Cousin   . Hypertension Maternal Grandmother   . Diabetes Maternal Grandmother   . Heart disease Maternal Grandfather   . Stroke Maternal Grandfather   . Alcohol abuse Maternal Aunt   . Drug abuse Maternal Aunt   . Anesthesia problems Neg Hx     Social History   Tobacco Use  . Smoking status: Never Smoker  . Smokeless tobacco: Never Used  Substance Use Topics  . Alcohol use: No    Comment: occasional alcohol  . Drug use: No    Prior to Admission medications   Medication Sig Start Date End Date Taking? Authorizing Provider  cetirizine (ZYRTEC) 10 MG tablet Take 10 mg by mouth  daily.   Yes [provider]  HYDROcodone-acetaminophen (NORCO) 5-325 MG tablet Take 1 tablet by mouth every 4 (four) hours as needed for severe pain. 11/05/19  Yes Sherwood Gambler, MD  metroNIDAZOLE (FLAGYL) 500 MG tablet Take 1 tablet (500 mg total) by mouth 3 (three) times daily for 10 days. 11/05/19 11/15/19 Yes Sherwood Gambler, MD  potassium chloride SA (KLOR-CON) 20 MEQ tablet Take 1 tablet (20 mEq total) by mouth 2 (two) times daily for 5 days. 11/05/19 11/10/19 Yes Sherwood Gambler, MD  azithromycin (ZITHROMAX) 250 MG tablet Take 1 tablet (250 mg total) by mouth daily. Take first 2 tablets together, then 1 every day until finished. Patient not taking: Reported on 06/19/2019 06/11/17   Robyn Haber, MD  fluconazole (DIFLUCAN) 150 MG tablet Take 150 mg by mouth once. 11/05/19   [provider]  HYDROcodone-homatropine (HYDROMET) 5-1.5 MG/5ML syrup Take 5 mLs by mouth every 6 (six) hours as needed for cough. Patient not taking: Reported on 06/19/2019 06/11/17   Robyn Haber, MD  ondansetron (ZOFRAN ODT) 4 MG disintegrating tablet Take 1 tablet (4 mg total) by mouth every 6 (six) hours as needed for nausea or vomiting. 06/19/19   Ward, Delice Bison, DO    Current Facility-Administered Medications  Medication Dose Route Frequency Provider Last Rate Last Admin  . acetaminophen (TYLENOL) tablet 650 mg  650 mg Oral Q6H PRN Bunnie Pion Z, DO   650 mg at 11/09/19 3614   Or  . acetaminophen (TYLENOL) suppository 650 mg  650 mg Rectal Q6H PRN Bunnie Pion Z, DO      . enoxaparin (LOVENOX) injection 40 mg  40 mg Subcutaneous QHS Bunnie Pion Z, DO   40 mg at 11/09/19 0016  . loratadine (CLARITIN) tablet 10 mg  10 mg Oral Daily Lovey Newcomer T, NP   10 mg at 11/09/19 0205  . ondansetron (ZOFRAN) tablet 4 mg  4 mg Oral Q6H PRN Bunnie Pion Z, DO       Or  . ondansetron [facility]) injection 4 mg  4 mg Intravenous Q6H PRN Bunnie Pion Z, DO        Allergies as of 11/08/2019 - Review  Complete 11/08/2019  Allergen Reaction Noted  . Dust mite extract Anaphylaxis 01/04/2012  . Kiwi extract Itching and Swelling 01/04/2012  . Pollen extract Anaphylaxis 01/04/2012     Review of Systems:    Constitutional: No weight loss, fever or chills Skin:  No rash  Cardiovascular: No chest pain  Respiratory: No SOB  Gastrointestinal: See HPI and otherwise negative Genitourinary: No dysuria Neurological: No headache Musculoskeletal: No new muscle or joint pain Hematologic: No bruising Psychiatric: No history of depression or anxiety    Physical Exam:  Vital signs in last 24 hours: Temp:  [98.2 F (36.8 C)-99.2 F (37.3 C)] 99.2 F (37.3 C) (03/01 0517) Pulse Rate:  [72-90] 87 (03/01 0517) Resp:  [16-20] 18 (03/01 0517) BP: (134-149)/(7-99) 138/92 (03/01 0517) SpO2:  [96 %-100 %] 96 % (03/01 0517) Weight:  [86.2 kg] 86.2 kg (02/28 1317) Last BM Date: 11/08/19 General:   Pleasant AA female appears to be in NAD, Well developed, Well nourished, alert and cooperative Head:  Normocephalic and atraumatic. Eyes:   PEERL, EOMI. No icterus. Conjunctiva pink. Ears:  Normal auditory acuity. Neck:  Supple Throat: Oral cavity and pharynx without inflammation, swelling or lesion.  Lungs: Respirations even and unlabored. Lungs clear to auscultation bilaterally.   No wheezes, crackles, or rhonchi.  Heart: Normal S1, S2. No MRG. Regular rate and rhythm. No peripheral edema, cyanosis or pallor.  Abdomen:  Soft, nondistended, moderate generalized ttp. No rebound or guarding. Normal bowel sounds. No appreciable masses or hepatomegaly. Rectal:  Not performed.  Msk:  Symmetrical without gross deformities. Peripheral pulses intact.  Extremities:  Without edema, no deformity or joint abnormality. Normal ROM, normal sensation. Neurologic:  Alert and  oriented x4;  grossly normal neurologically.   Skin:   Dry and intact without significant lesions or rashes. Psychiatric: Demonstrates good  judgement and reason without abnormal affect or behaviors.   LAB RESULTS: Recent Labs    11/08/19 1333 11/09/19 0543  WBC 7.1 6.5  HGB 10.3* 9.4*  HCT 34.4* 32.6*  PLT 374 331   BMET Recent Labs    11/08/19 1333 11/09/19 0543  NA 135 139  K 3.8 3.6  CL 103 107  CO2 24 23  GLUCOSE 91 95  BUN 5* <5*  CREATININE 0.83 0.74  CALCIUM 9.1 8.6*   LFT Recent Labs    11/09/19 0543  PROT 6.9  ALBUMIN 3.7  AST 20  ALT 14  ALKPHOS 85  BILITOT 0.2*     Impression / Plan:   Impression: 1.  Pancolitis: Seen on CT scan with diarrhea and abdominal pain as well as nausea, unable to provide stool sample since admit, symptoms 60% better over past 24 hours with no food, no change with 3 days of Flagyl TID outpatient; consider infectious vs viral vs inflammatory 2.  Anemia: Current hemoglobin 9.4, this is chronic for the patient thought secondary to menorrhagia  Plan: 1.  Agree with stool studies including C. difficile and GI pathogen panel 2.  Continue antiemetics, fluid resuscitation and pain control 3.  Continue to monitor hemoglobin with transfusion as needed less than 7 4.  Will advance to full liquid diet today and see how she does, hopefully this will help [place] get a sample 5. Please await any further recommendations from Dr. Fuller Plan later today  Thank you for your kind consultation, we will continue to follow.  Lavone Nian [facility], [facility], [facility]  11/09/2019, 8:33 AM     Attending Physician Note   I have taken a history, examined the patient and reviewed the chart. I agree with the Advanced Practitioner's note, impression and recommendations.   Pancolitis, infectious vs IBD treated with 3 days of Flagyl as outpatient Microcytic anemia, likely IDA secondary to AUB, anticipate further Hgb decrease with hydration  Trend CBC Iron studies IV fluids Clear to full liquids as tolerated Dicyclomine ac and hs Await stool studies Flex sig vs colonoscopy if diagnosis is not established with  stool studies  [name], MD [facility]

## 2019-11-09 NOTE — Progress Notes (Signed)
[facility] PLAN OF CARE NOTE Patient: ZAHLIA DESHAZER NOI:[id number]   PCP: Martyn Malay, MD DOB: [birth year]   DOA: 11/08/2019   DOS: 11/09/2019    Patient was admitted by my colleague Dr. Humphrey Rolls earlier on 11/09/2019. I have reviewed the H&P as well as assessment and plan and agree with the same. Important changes in the plan are listed below.  Plan of care: Principal Problem:   Pancolitis ([place]) Active Problems:   Anemia   Generalized weakness Appreciate [facility] consultation. Work-up initiated. May benefit from biopsy.  Author: Berle Mull, MD [facility] 11/09/2019 7:22 PM   If 7PM-7AM, please contact night-coverage at [email or web address]

## 2019-11-09 NOTE — H&P (Signed)
History and Physical    Erika David YEB:[id number] DOB: 1976/07/04 DOA: 11/08/2019  PCP: Martyn Malay, MD (Confirm with patient/family/NH records and if not entered, this has to be entered at [facility] point of entry) Patient coming from: Home  I have personally briefly reviewed patient's old medical records in [place]  Chief Complaint: Abdominal pain and diarrhea  HPI: Erika David is a 44 y.o. female with medical history significant of anemia, STDs and menorrhagia presented to [facility] ED for evaluation of abdominal pain and diarrhea.  Patient states that she is having diarrhea for the last 5 days with diffuse nonspecific abdominal pain.  Patient went to the emergency department on the second day of her illness and CT scan was done that showed pancolitis and patient was discharged home on Flagyl but patient return again because her condition did not improve and she is still having diffuse abdominal pain with loose stools.  Patient further mentioned that she used antibiotics recently after tooth extraction.  Patient is complaining of severe generalized weakness but denies fever, chills, chest pain, shortness of breath and urinary symptoms.    ED Course: On arrival to the ED in [place] patient had temperature of 98.4, blood pressure 149/99, heart rate 73, respiratory rate 17 and oxygen saturation 99% on room air.  Blood work showed hemoglobin of 10.3 while rest of the blood work was within normal limits.  Stool test were ordered but patient was unable to defecate.  Patient was admitted to [facility] for observation and further management.  Patient was given IV fluids in the ED.  Review of Systems: As per HPI otherwise 10 point review of systems negative.    Past Medical History:  Diagnosis Date  . Abnormal Pap smear     LEEP?  LAST PAP 05/2009  . Anemia 01/04/2012  . Chlamydia   . H/O candidiasis   . H/O dysmenorrhea 03/13/02  . H/O gonorrhea    treated  . H/O menorrhagia 07/09/02  . Infection 2006   GONORHRREA  . Infection 2006   CHLAMYDIA  . Infection    FREQ YEAST  . Pelvic injury AS TEEN   SPORTS RELATED  . Pelvic pain 07/11/10  . Trichomonas   . Yeast infection     Past Surgical History:  Procedure Laterality Date  . CESAREAN SECTION  04/11/2012   Procedure: CESAREAN SECTION;  Surgeon: Delice Lesch, MD;  Location: [facility];  Service: Gynecology;  Laterality: N/A;  twins  . GYNECOLOGIC CRYOSURGERY    . TUBAL LIGATION    . WISDOM TOOTH EXTRACTION  2007     reports that she has never smoked. She has never used smokeless tobacco. She reports that she does not drink alcohol or use drugs.  Allergies  Allergen Reactions  . Dust Mite Extract Anaphylaxis  . Kiwi Extract Itching and Swelling  . Pollen Extract Anaphylaxis    Tree pollen in environment    Family History  Problem Relation Age of Onset  . Hypertension Mother   . Cancer Mother        BREAST  . Thyroid disease Mother   . Diabetes Father   . Hypertension Sister   . Lupus Maternal Aunt   . Liver disease Sister   . Asthma Cousin   . Hypertension Maternal Grandmother   . Diabetes Maternal Grandmother   . Heart disease Maternal Grandfather   . Stroke Maternal Grandfather   . Alcohol  abuse Maternal Aunt   . Drug abuse Maternal Aunt   . Anesthesia problems Neg Hx      Prior to Admission medications   Medication Sig Start Date End Date Taking? Authorizing Provider  cetirizine (ZYRTEC) 10 MG tablet Take 10 mg by mouth daily.   Yes [provider]  HYDROcodone-acetaminophen (NORCO) 5-325 MG tablet Take 1 tablet by mouth every 4 (four) hours as needed for severe pain. 11/05/19  Yes Sherwood Gambler, MD  metroNIDAZOLE (FLAGYL) 500 MG tablet Take 1 tablet (500 mg total) by mouth 3 (three) times daily for 10 days. 11/05/19 11/15/19 Yes Sherwood Gambler, MD  potassium chloride SA (KLOR-CON) 20 MEQ tablet Take 1 tablet (20 mEq total) by mouth 2 (two) times  daily for 5 days. 11/05/19 11/10/19 Yes Sherwood Gambler, MD  azithromycin (ZITHROMAX) 250 MG tablet Take 1 tablet (250 mg total) by mouth daily. Take first 2 tablets together, then 1 every day until finished. Patient not taking: Reported on 06/19/2019 06/11/17   Robyn Haber, MD  fluconazole (DIFLUCAN) 150 MG tablet Take 150 mg by mouth once. 11/05/19   [provider]  HYDROcodone-homatropine (HYDROMET) 5-1.5 MG/5ML syrup Take 5 mLs by mouth every 6 (six) hours as needed for cough. Patient not taking: Reported on 06/19/2019 06/11/17   Robyn Haber, MD  ondansetron (ZOFRAN ODT) 4 MG disintegrating tablet Take 1 tablet (4 mg total) by mouth every 6 (six) hours as needed for nausea or vomiting. 06/19/19   Ward, Delice Bison, DO    Physical Exam: Vitals:   11/08/19 1804 11/08/19 1925 11/08/19 2100 11/09/19 0517  BP: 134/87 (!) 143/86 (!) 149/99 (!) 138/92  Pulse: 90 72 73 87  Resp: 20 16 17 18   Temp:  98.3 F (36.8 C) 98.4 F (36.9 C) 99.2 F (37.3 C)  TempSrc:  Oral  Oral  SpO2: 100% 100% 99% 96%  Weight:      Height:        Constitutional: NAD, calm, comfortable Vitals:   11/08/19 1804 11/08/19 1925 11/08/19 2100 11/09/19 0517  BP: 134/87 (!) 143/86 (!) 149/99 (!) 138/92  Pulse: 90 72 73 87  Resp: 20 16 17 18   Temp:  98.3 F (36.8 C) 98.4 F (36.9 C) 99.2 F (37.3 C)  TempSrc:  Oral  Oral  SpO2: 100% 100% 99% 96%  Weight:      Height:       General: Patient is a pleasant 44 year old African-American female in no acute distress. Eyes: PERRL, lids and conjunctivae normal ENMT: Mucous membranes are moist. Posterior pharynx clear of any exudate or lesions.Normal dentition.  Neck: normal, supple, no masses, no thyromegaly Respiratory: clear to auscultation bilaterally, no wheezing, no crackles. Normal respiratory effort. No accessory muscle use.  Cardiovascular: Regular rate and rhythm, no murmurs / rubs / gallops. No extremity edema. 2+ pedal pulses. No carotid bruits.    Abdomen: Mild diffuse tenderness on deep palpation but abdomen is soft and not distended.  Tenderness, no masses palpated. No hepatosplenomegaly. Bowel sounds positive and hyperactive Musculoskeletal: no clubbing / cyanosis. No joint deformity upper and lower extremities. Good ROM, no contractures. Normal muscle tone.  Skin: no rashes, lesions, ulcers. No induration Neurologic: CN 2-12 grossly intact. Sensation intact, DTR normal. Strength 5/5 in all 4.  Psychiatric: Normal judgment and insight. Alert and oriented x 3. Normal mood.    Labs on Admission: I have personally reviewed following labs and imaging studies  CBC: Recent Labs  Lab 11/05/19 1607 11/08/19 1333  WBC 7.4 7.1  NEUTROABS 4.4  --   HGB 9.7* 10.3*  HCT 32.2* 34.4*  MCV 72.0* 72.0*  PLT 343 314   Basic Metabolic Panel: Recent Labs  Lab 11/05/19 1543 11/05/19 1607 11/08/19 1333  NA 137  --  135  K 2.9*  --  3.8  CL 104  --  103  CO2 24  --  24  GLUCOSE 102*  --  91  BUN 7  --  5*  CREATININE 0.81  --  0.83  CALCIUM 8.9  --  9.1  MG  --  2.1  --    GFR: Estimated Creatinine Clearance: 89.1 mL/min (by C-G formula based on SCr of 0.83 mg/dL). Liver Function Tests: Recent Labs  Lab 11/05/19 1543 11/08/19 1333  AST 16 19  ALT 12 12  ALKPHOS 110 99  BILITOT 0.2* 0.3  PROT 7.3 7.8  ALBUMIN 3.8 4.1   Recent Labs  Lab 11/05/19 1543 11/08/19 1333  LIPASE 20 17   No results for input(s): AMMONIA in the last 168 hours. Coagulation Profile: No results for input(s): INR, PROTIME in the last 168 hours. Cardiac Enzymes: No results for input(s): CKTOTAL, CKMB, CKMBINDEX, TROPONINI in the last 168 hours. BNP (last 3 results) No results for input(s): PROBNP in the last 8760 hours. HbA1C: No results for input(s): HGBA1C in the last 72 hours. CBG: No results for input(s): GLUCAP in the last 168 hours. Lipid Profile: No results for input(s): CHOL, HDL, LDLCALC, TRIG, CHOLHDL, LDLDIRECT in the last 72  hours. Thyroid Function Tests: No results for input(s): TSH, T4TOTAL, FREET4, T3FREE, THYROIDAB in the last 72 hours. Anemia Panel: No results for input(s): VITAMINB12, FOLATE, FERRITIN, TIBC, IRON, RETICCTPCT in the last 72 hours. Urine analysis:    Component Value Date/Time   COLORURINE YELLOW 11/05/2019 1546   APPEARANCEUR CLEAR 11/05/2019 1546   LABSPEC >1.030 (H) 11/05/2019 1546   PHURINE 6.0 11/05/2019 1546   GLUCOSEU NEGATIVE 11/05/2019 1546   HGBUR NEGATIVE 11/05/2019 [place] 11/05/2019 1546   BILIRUBINUR neg 05/29/2013 [place] 11/05/2019 1546   PROTEINUR NEGATIVE 11/05/2019 1546   UROBILINOGEN 0.2 05/29/2013 1412   UROBILINOGEN 0.2 04/08/2012 2330   NITRITE NEGATIVE 11/05/2019 1546   LEUKOCYTESUR NEGATIVE 11/05/2019 1546    Radiological Exams on Admission: No results found.   Assessment/Plan Principal Problem:   Pancolitis ([place])  CT abdomen was positive for pancolitis Continue IV fluids. Patient was given IV morphine in the ED and pain is completely resolved. Tylenol for fever Zofran for nausea and vomiting as needed C. difficile and stool PCR for this ordered  Active Problems:   Anemia Anemia is most likely secondary to menorrhagia. Patient is advised to follow-up with her OB/GYN. New to monitor hemoglobin.    Generalized weakness Generalized weakness most likely secondary to dehydration from diarrhea and anemia Continue IV fluids.   DVT prophylaxis: Lovenox Code Status: Full code Consults called:  Admission status: Observation/MedSurg   Edmonia Lynch MD [facility] Pager [phone]   If 7PM-7AM, please contact night-coverage [email or web address] Password   11/09/2019, 5:58 AM

## 2019-11-10 DIAGNOSIS — R197 Diarrhea, unspecified: Secondary | ICD-10-CM

## 2019-11-10 DIAGNOSIS — K5100 Ulcerative (chronic) pancolitis without complications: Secondary | ICD-10-CM | POA: Diagnosis not present

## 2019-11-10 LAB — C DIFFICILE QUICK SCREEN W PCR REFLEX
C Diff antigen: NEGATIVE
C Diff interpretation: NOT DETECTED
C Diff toxin: NEGATIVE

## 2019-11-10 LAB — BASIC METABOLIC PANEL
Anion gap: 10 (ref 5–15)
BUN: <5 mg/dL — ABNORMAL LOW (ref 6–20)
CO2: 22 mmol/L (ref 22–32)
Calcium: 9 mg/dL (ref 8.9–10.3)
Chloride: 105 mmol/L (ref 98–111)
Creatinine, Ser: 0.91 mg/dL (ref 0.44–1.00)
GFR calc Af Amer: >60 mL/min (ref >60)
GFR calc non Af Amer: >60 mL/min (ref >60)
Glucose, Bld: 99 mg/dL (ref 70–99)
Potassium: 3.5 mmol/L (ref 3.5–5.1)
Sodium: 137 mmol/L (ref 135–145)

## 2019-11-10 LAB — CBC
HCT: 32.4 % — ABNORMAL LOW (ref 36.0–46.0)
Hemoglobin: 9.6 g/dL — ABNORMAL LOW (ref 12.0–15.0)
MCH: 21.5 pg — ABNORMAL LOW (ref 26.0–34.0)
MCHC: 29.6 g/dL — ABNORMAL LOW (ref 30.0–36.0)
MCV: 72.6 fL — ABNORMAL LOW (ref 80.0–100.0)
Platelets: 327 10*3/uL (ref 150–400)
RBC: 4.46 MIL/uL (ref 3.87–5.11)
RDW: 18.4 % — ABNORMAL HIGH (ref 11.5–15.5)
WBC: 6.4 10*3/uL (ref 4.0–10.5)
nRBC: 0 % (ref 0.0–0.2)

## 2019-11-10 LAB — LACTIC ACID, PLASMA: Lactic Acid, Venous: 0.7 mmol/L (ref 0.5–1.9)

## 2019-11-10 LAB — MAGNESIUM: Magnesium: 2.2 mg/dL (ref 1.7–2.4)

## 2019-11-10 MED ORDER — TRAMADOL HCL 50 MG PO TABS
50.0000 mg | ORAL_TABLET | Freq: Four times a day (QID) | ORAL | Status: DC | PRN
  Administered 2019-11-10 – 2019-11-13 (×6): 50 mg via ORAL
  Filled 2019-11-10 (×6): qty 1

## 2019-11-10 NOTE — Progress Notes (Addendum)
    Progress Note   Subjective  Persistent non bloody diarrhea   Objective  Vital signs in last 24 hours: Temp:  [98.6 F (37 C)-98.7 F (37.1 C)] 98.7 F (37.1 C) (03/02 0558) Pulse Rate:  [71-75] 75 (03/02 0558) Resp:  [18] 18 (03/02 0558) BP: (130-136)/(81-97) 130/81 (03/02 0558) SpO2:  [99 %-100 %] 99 % (03/02 0558) Last BM Date: 11/09/19  General: Alert, well-developed, in NAD Heart:  Regular rate and rhythm; no murmurs Chest: Clear to ascultation bilaterally Abdomen:  Soft, nontender and nondistended. Normal bowel sounds, without guarding, and without rebound.   Extremities:  Without edema. Neurologic:  Alert and  oriented x4; grossly normal neurologically. Psych:  Alert and cooperative. Normal mood and affect.  Intake/Output from previous day: 03/01 0701 - 03/02 0700 In: 840 [P.O.:840] Out: 0  Intake/Output this shift: No intake/output data recorded.  Lab Results: Recent Labs    11/08/19 1333 11/09/19 0543 11/10/19 0520  WBC 7.1 6.5 6.4  HGB 10.3* 9.4* 9.6*  HCT 34.4* 32.6* 32.4*  PLT 374 331 327   BMET Recent Labs    11/08/19 1333 11/09/19 0543 11/10/19 0520  NA 135 139 137  K 3.8 3.6 3.5  CL 103 107 105  CO2 24 23 22   GLUCOSE 91 95 99  BUN 5* <5* <5*  CREATININE 0.83 0.74 0.91  CALCIUM 9.1 8.6* 9.0   LFT Recent Labs    11/09/19 0543  PROT 6.9  ALBUMIN 3.7  AST 20  ALT 14  ALKPHOS 85  BILITOT 0.2*    Studies/Results: DG ABD ACUTE 2+V W 1V CHEST  Result Date: 11/09/2019 CLINICAL DATA:  Umbilical abdominal pain and diarrhea for 4 days. EXAM: DG ABDOMEN ACUTE W/ 1V CHEST COMPARISON:  11/05/2019 FINDINGS: There is no evidence of dilated bowel loops or free intraperitoneal air. No radiopaque calculi or other significant radiographic abnormality is seen. Heart size and mediastinal contours are within normal limits. Both lungs are clear. IMPRESSION: Negative abdominal radiographs.  No acute cardiopulmonary disease. Electronically Signed   By:  Kerby Moors M.D.   On: 11/09/2019 09:44      Assessment & Recommendations   1. Pancolitis on CT with persistent diarrhea. GI pathogen panel, C diff are pending. If stool studies are not diagnostic will proceed with flex sig/colonoscopy.   2. IDA. IV iron is recommended. Avoid iron PO with acute GI symptoms.     LOS: 1 day   Arian Murley T. Fuller Plan MD 11/10/2019, 8:52 AM

## 2019-11-10 NOTE — Progress Notes (Signed)
[facility] Progress Note  Patient: Erika David    HYI:[id number]  DOA: 11/08/2019     Date of Service: the patient was seen and examined on 11/10/2019  Chief complaint. Abdominal pain and diarrhea.  Brief hospital course: Chronic anemia from menorrhagia.  Patient presented with abdominal pain and diarrhea.  Was seen at [facility].  Discharged home with oral Flagyl.  Come back for a follow-up and continues to have the same symptoms and was recommended for admission.  CT scan shows pancolitis Currently further plan is rule out infection otherwise patient will require invasive work-up.  Assessment and Plan: 1.  Pancolitis. Persistent diarrhea. Appreciate [place] [facility] input. GI pathogen panel pending. C. difficile negative. Patient had 6 loose watery bowel movement yesterday. GI planning flexible sigmoidoscopy/colonoscopy if the patient does not have improvement.  2.  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 Also recommends avoiding p.o. iron with acute GI symptoms. In the setting of ruling out infection I am holding off on IV iron.  Body mass index is 35.32 kg/m.   Diet: Full liquid diet DVT Prophylaxis: Subcutaneous Lovenox   Advance goals of care discussion: Full code  Family Communication: family was present at bedside, at the time of interview.  The pt provided permission to discuss medical plan with the family. Opportunity was given to ask question and all questions were answered satisfactorily.   Disposition:  Pt is from home, admitted with abdominal pain and diarrhea, still has diarrhea, which precludes a safe discharge. Discharge to home, when work-up completed and GI recommends discharge.  Subjective: Continues to have diarrhea No nausea no vomiting.  Abdominal pain is also increasingly worsening.  No fever no chills.  Physical Exam: General:  alert oriented to time, place, and person.  Appear in mild  distress, affect appropriate Eyes: PERRL ENT: Oral Mucosa Clear, moist  Neck: no JVD,  Cardiovascular: S1 and S2 Present, no Murmur,  Respiratory: good respiratory effort, Bilateral Air entry equal and Decreased, no Crackles, no wheezes Abdomen: Bowel Sound present, Soft and mild tenderness,  Skin: no rash Extremities: no Pedal edema, no calf tenderness Neurologic: without any new focal findings Gait not checked due to patient safety concerns  Vitals:   11/09/19 1327 11/09/19 2102 11/10/19 0558 11/10/19 1342  BP: 136/84 (!) 133/97 130/81 131/90  Pulse: 71 72 75 69  Resp:  18 18 18   Temp: 98.7 F (37.1 C) 98.6 F (37 C) 98.7 F (37.1 C) 98.6 F (37 C)  TempSrc: Oral  Oral Oral  SpO2: 100% 100% 99% 100%  Weight:      Height:        Intake/Output Summary (Last 24 hours) at 11/10/2019 1742 Last data filed at 11/10/2019 0915 Gross per 24 hour  Intake 360 ml  Output --  Net 360 ml   Filed Weights   11/08/19 1317  Weight: 86.2 kg    Data Reviewed: I have personally reviewed and interpreted daily labs, tele strips, imagings as discussed above. I reviewed all nursing notes, pharmacy notes, vitals, pertinent old records I have discussed plan of care as described above with RN and patient/family.  CBC: Recent Labs  Lab 11/05/19 1607 11/08/19 1333 11/09/19 0543 11/10/19 0520  WBC 7.4 7.1 6.5 6.4  NEUTROABS 4.4  --   --   --   HGB 9.7* 10.3* 9.4* 9.6*  HCT 32.2* 34.4* 32.6* 32.4*  MCV 72.0* 72.0* 74.1* 72.6*  PLT 343 374 331 327  Basic Metabolic Panel: Recent Labs  Lab 11/05/19 1543 11/05/19 1607 11/08/19 1333 11/09/19 0543 11/10/19 0520  NA 137  --  135 139 137  K 2.9*  --  3.8 3.6 3.5  CL 104  --  103 107 105  CO2 24  --  24 23 22   GLUCOSE 102*  --  91 95 99  BUN 7  --  5* <5* <5*  CREATININE 0.81  --  0.83 0.74 0.91  CALCIUM 8.9  --  9.1 8.6* 9.0  MG  --  2.1  --   --  2.2    Studies: No results found.  Scheduled Meds: . dicyclomine  10 mg Oral TID  AC & HS  . enoxaparin (LOVENOX) injection  40 mg Subcutaneous QHS  . loratadine  10 mg Oral Daily   Continuous Infusions: PRN Meds: acetaminophen **OR** acetaminophen, lip balm, ondansetron **OR** ondansetron (ZOFRAN) IV, traMADol  Time spent: 35 minutes  Author: Berle Mull, MD [facility] 11/10/2019 5:42 PM  To reach On-call, see care teams to locate the attending and reach out to them via [email or web address]. If 7PM-7AM, please contact night-coverage If you still have difficulty reaching the attending provider, please page the [facility] (Director on Call) for [facility] on amion for assistance.

## 2019-11-11 DIAGNOSIS — K5100 Ulcerative (chronic) pancolitis without complications: Secondary | ICD-10-CM | POA: Diagnosis not present

## 2019-11-11 DIAGNOSIS — D500 Iron deficiency anemia secondary to blood loss (chronic): Secondary | ICD-10-CM

## 2019-11-11 LAB — BASIC METABOLIC PANEL
Anion gap: 8 (ref 5–15)
BUN: <5 mg/dL — ABNORMAL LOW (ref 6–20)
CO2: 23 mmol/L (ref 22–32)
Calcium: 8.9 mg/dL (ref 8.9–10.3)
Chloride: 106 mmol/L (ref 98–111)
Creatinine, Ser: 0.94 mg/dL (ref 0.44–1.00)
GFR calc Af Amer: >60 mL/min (ref >60)
GFR calc non Af Amer: >60 mL/min (ref >60)
Glucose, Bld: 93 mg/dL (ref 70–99)
Potassium: 3.4 mmol/L — ABNORMAL LOW (ref 3.5–5.1)
Sodium: 137 mmol/L (ref 135–145)

## 2019-11-11 LAB — CBC
HCT: 32.7 % — ABNORMAL LOW (ref 36.0–46.0)
Hemoglobin: 9.8 g/dL — ABNORMAL LOW (ref 12.0–15.0)
MCH: 21.4 pg — ABNORMAL LOW (ref 26.0–34.0)
MCHC: 30 g/dL (ref 30.0–36.0)
MCV: 71.4 fL — ABNORMAL LOW (ref 80.0–100.0)
Platelets: 317 10*3/uL (ref 150–400)
RBC: 4.58 MIL/uL (ref 3.87–5.11)
RDW: 18.3 % — ABNORMAL HIGH (ref 11.5–15.5)
WBC: 6.2 10*3/uL (ref 4.0–10.5)
nRBC: 0 % (ref 0.0–0.2)

## 2019-11-11 MED ORDER — POTASSIUM CHLORIDE 10 MEQ/100ML IV SOLN
10.0000 meq | INTRAVENOUS | Status: AC
  Administered 2019-11-11 (×3): 10 meq via INTRAVENOUS
  Filled 2019-11-11 (×3): qty 100

## 2019-11-11 MED ORDER — PEG-KCL-NACL-NASULF-NA ASC-C 100 G PO SOLR: 1.0000 | Freq: Once | ORAL | Status: DC

## 2019-11-11 MED ORDER — PEG-KCL-NACL-NASULF-NA ASC-C 100 G PO SOLR
0.5000 | Freq: Once | ORAL | Status: AC
  Administered 2019-11-11: 100 g via ORAL
  Filled 2019-11-11: qty 1

## 2019-11-11 MED ORDER — PEG-KCL-NACL-NASULF-NA ASC-C 100 G PO SOLR
0.5000 | Freq: Once | ORAL | Status: AC
  Administered 2019-11-12: 100 g via ORAL

## 2019-11-11 MED ORDER — SODIUM CHLORIDE 0.9 % IV SOLN
510.0000 mg | Freq: Once | INTRAVENOUS | Status: AC
  Administered 2019-11-11: 510 mg via INTRAVENOUS
  Filled 2019-11-11: qty 510

## 2019-11-11 MED ORDER — SODIUM CHLORIDE 0.9 % IV SOLN: INTRAVENOUS | Status: DC

## 2019-11-11 NOTE — Progress Notes (Addendum)
     Erika David  CC:  Colitis, diarrhea  Subjective:  About the same today.  Still with frequent diarrhea.  Abdominal cramping, kpad does help.  Objective:  Vital signs in last 24 hours: Temp:  [98 F (36.7 C)-98.6 F (37 C)] 98 F (36.7 C) (03/03 0503) Pulse Rate:  [67-71] 71 (03/03 0503) Resp:  [16-18] 16 (03/03 0503) BP: (131-136)/(83-97) 136/83 (03/03 0503) SpO2:  [97 %-100 %] 97 % (03/03 0503) Weight:  [84.5 kg] 84.5 kg (03/02 2032) Last BM Date: 11/10/19 General:  Alert, Well-developed, in NAD Heart:  Regular rate and rhythm; no murmurs Pulm:  CTAB.  No increased WOB. Abdomen:  Soft, non-distended.  BS present and hyperactive.  Diffusely tender. Extremities:  Without edema. Neurologic:  Alert and oriented x 4;  grossly normal neurologically. Psych:  Alert and cooperative. Normal mood and affect.  Intake/Output from previous day: 03/02 0701 - 03/03 0700 In: 240 [P.O.:240] Out: -   Lab Results: Recent Labs    11/09/19 0543 11/10/19 0520 11/11/19 0533  WBC 6.5 6.4 6.2  HGB 9.4* 9.6* 9.8*  HCT 32.6* 32.4* 32.7*  PLT 331 327 317   BMET Recent Labs    11/09/19 0543 11/10/19 0520 11/11/19 0533  NA 139 137 137  K 3.6 3.5 3.4*  CL 107 105 106  CO2 23 22 23   GLUCOSE 95 99 93  BUN <5* <5* <5*  CREATININE 0.74 0.91 0.94  CALCIUM 8.6* 9.0 8.9   LFT Recent Labs    11/09/19 0543  PROT 6.9  ALBUMIN 3.7  AST 20  ALT 14  ALKPHOS 85  BILITOT 0.2*   DG ABD ACUTE 2+V W 1V CHEST  Result Date: 11/09/2019 CLINICAL DATA:  Umbilical abdominal pain and diarrhea for 4 days. EXAM: DG ABDOMEN ACUTE W/ 1V CHEST COMPARISON:  11/05/2019 FINDINGS: There is no evidence of dilated bowel loops or free intraperitoneal air. No radiopaque calculi or other significant radiographic abnormality is seen. Heart size and mediastinal contours are within normal limits. Both lungs are clear. IMPRESSION: Negative abdominal radiographs.  No acute cardiopulmonary  disease. Electronically Signed   By: Erika David M.D.   On: 11/09/2019 09:44   Assessment / Plan: 1. Pancolitis on CT with persistent diarrhea. GI pathogen panel pending, C diff negative.  Tentatively planning for colonoscopy 3/4 at 1015 AM.  2. IDA.  Received dose of Feraheme this AM.  Hgb stable.   LOS: 2 days   Erika David. Erika David  11/11/2019, 9:00 AM      Attending Physician David   I have taken an interval history, reviewed the chart and examined the patient. I agree with the Advanced Practitioner's David, impression and recommendations.   Symptoms persist, unchanged. C diff negative. GI pathogen panel from 3/1 is pending. Colonoscopy tentatively planned for tomorrow, R/O IBD. If a diagnosis is established on GI pathogen panel we may cancel colonoscopy. Received Feraheme today.    Erika Edward, MD [facility]

## 2019-11-11 NOTE — Progress Notes (Signed)
Updated the patient's sister Ms. Claiborne Billings via phone at [phone].  All questions answered.

## 2019-11-11 NOTE — H&P (View-Only) (Signed)
     [facility] Progress Note  CC:  Colitis, diarrhea  Subjective:  About the same today.  Still with frequent diarrhea.  Abdominal cramping, kpad does help.  Objective:  Vital signs in last 24 hours: Temp:  [98 F (36.7 C)-98.6 F (37 C)] 98 F (36.7 C) (03/03 0503) Pulse Rate:  [67-71] 71 (03/03 0503) Resp:  [16-18] 16 (03/03 0503) BP: (131-136)/(83-97) 136/83 (03/03 0503) SpO2:  [97 %-100 %] 97 % (03/03 0503) Weight:  [84.5 kg] 84.5 kg (03/02 2032) Last BM Date: 11/10/19 General:  Alert, Well-developed, in NAD Heart:  Regular rate and rhythm; no murmurs Pulm:  CTAB.  No increased WOB. Abdomen:  Soft, non-distended.  BS present and hyperactive.  Diffusely tender. Extremities:  Without edema. Neurologic:  Alert and oriented x 4;  grossly normal neurologically. Psych:  Alert and cooperative. Normal mood and affect.  Intake/Output from previous day: 03/02 0701 - 03/03 0700 In: 240 [P.O.:240] Out: -   Lab Results: Recent Labs    11/09/19 0543 11/10/19 0520 11/11/19 0533  WBC 6.5 6.4 6.2  HGB 9.4* 9.6* 9.8*  HCT 32.6* 32.4* 32.7*  PLT 331 327 317   BMET Recent Labs    11/09/19 0543 11/10/19 0520 11/11/19 0533  NA 139 137 137  K 3.6 3.5 3.4*  CL 107 105 106  CO2 23 22 23   GLUCOSE 95 99 93  BUN <5* <5* <5*  CREATININE 0.74 0.91 0.94  CALCIUM 8.6* 9.0 8.9   LFT Recent Labs    11/09/19 0543  PROT 6.9  ALBUMIN 3.7  AST 20  ALT 14  ALKPHOS 85  BILITOT 0.2*   DG ABD ACUTE 2+V W 1V CHEST  Result Date: 11/09/2019 CLINICAL DATA:  Umbilical abdominal pain and diarrhea for 4 days. EXAM: DG ABDOMEN ACUTE W/ 1V CHEST COMPARISON:  11/05/2019 FINDINGS: There is no evidence of dilated bowel loops or free intraperitoneal air. No radiopaque calculi or other significant radiographic abnormality is seen. Heart size and mediastinal contours are within normal limits. Both lungs are clear. IMPRESSION: Negative abdominal radiographs.  No acute cardiopulmonary  disease. Electronically Signed   By: Kerby Moors M.D.   On: 11/09/2019 09:44   Assessment / Plan: 1. Pancolitis on CT with persistent diarrhea. GI pathogen panel pending, C diff negative.  Tentatively planning for colonoscopy 3/4 at 1015 AM.  2. IDA.  Received dose of Feraheme this AM.  Hgb stable.   LOS: 2 days   Erika David. Zehr  11/11/2019, 9:00 AM      Attending Physician Note   I have taken an interval history, reviewed the chart and examined the patient. I agree with the Advanced Practitioner's note, impression and recommendations.   Symptoms persist, unchanged. C diff negative. GI pathogen panel from 3/1 is pending. Colonoscopy tentatively planned for tomorrow, R/O IBD. If a diagnosis is established on GI pathogen panel we may cancel colonoscopy. Received Feraheme today.    [name], MD [facility]

## 2019-11-11 NOTE — Progress Notes (Signed)
 PROGRESS NOTE  Erika David MRN:[id number] DOB: 01/14/1976 DOA: 11/08/2019 PCP: Brown, Carina M, MD  HPI/Recap of past 24 hours: Chronic anemia from menorrhagia.  Patient presented with abdominal pain and diarrhea.  [facility].  Discharged home with oral Flagyl.  Come back for a follow-up and continues to have the same symptoms and was recommended for admission.  CT scan shows pancolitis.  Cdiff pcr negative.  GI panel in process.  11/11/19: Seen and examined.  Reports abdominal cramping and diarrhea x8 bowel movements yesterday.  Seen by GI.  Tentative planning for colonoscopy on 3/4 1015 a.m.  Will be n.p.o. after midnight.  Assessment/Plan: Principal Problem:   Pancolitis (HCC) Active Problems:   Anemia   Generalized weakness  Pancolitis on CT with persistent diarrhea. Appreciate [facility] input. C. difficile PCR negative GI pathogen panel in process Report 8 loose bowel movements yesterday Tentative planning for colonoscopy on 3/4 1015 a.m.  Will be n.p.o. after midnight.  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 iron supplement in the setting of acute GI illness H&H stable at this time  Hypokalemia secondary to GI losses from diarrhea Potassium 3.4 Repleted with IV potassium  Obesity BMI 34 Recommend weight loss outpatient with regular physical activity and healthy dieting    Diet: Full liquid diet, n.p.o. after midnight. DVT Prophylaxis: Subcutaneous Lovenox  daily  Advance goals of care discussion: Full code  Family Communication:  Will call family if okay with the patient.  Disposition:  Pt is from home, anticipate discharge to home after colonoscopy, improvement of diarrhea, and when GI signs off.      Objective: Vitals:   11/10/19 1342 11/10/19 2032 11/10/19 2107 11/11/19 0503  BP: 131/90  (!) 131/97  136/83  Pulse: 69  67 71  Resp: 18  16 16  Temp: 98.6 F (37 C)  98.4 F (36.9 C) 98 F (36.7 C)  TempSrc: Oral     SpO2: 100%  99% 97%  Weight:  84.5 kg    Height:       No intake or output data in the 24 hours ending 11/11/19 1157 Filed Weights   11/08/19 1317 11/10/19 2032  Weight: 86.2 kg 84.5 kg    Exam:  . General: 44 y.o. year-old female well developed well nourished in no acute distress.  Alert and oriented x3. . Cardiovascular: Regular rate and rhythm with no rubs or gallops.  No thyromegaly or JVD noted.   . Respiratory: Clear to auscultation with no wheezes or rales. Good inspiratory effort. . Abdomen: Soft diffuse discomfort with palpation.  Bowel sounds present.   . Musculoskeletal: No lower extremity edema. 2/4 pulses in all 4 extremities. . Psychiatry: Mood is appropriate for condition and setting   Data Reviewed: CBC: Recent Labs  Lab 11/05/19 1607 11/08/19 1333 11/09/19 0543 11/10/19 0520 11/11/19 0533  WBC 7.4 7.1 6.5 6.4 6.2  NEUTROABS 4.4  --   --   --   --   HGB 9.7* 10.3* 9.4* 9.6* 9.8*  HCT 32.2* 34.4* 32.6* 32.4* 32.7*  MCV 72.0* 72.0* 74.1* 72.6* 71.4*  PLT 343 374 331 327 317   Basic Metabolic Panel: Recent Labs  Lab 11/05/19 1543 11/05/19 1607 11/08/19 1333 11/09/19 0543 11/10/19 0520 11/11/19 0533  NA 137  --  135 139 137 137  K 2.9*  --  3.8   3.6 3.5 3.4*  CL 104  --  103 107 105 106  CO2 24  --  _0 GLUCOSE 102*  --  91 95 99 93  BUN 7  --  5* <5* <5* <5*  CREATININE 0.81  --  0.83 0.74 0.91 0.94  CALCIUM 8.9  --  9.1 8.6* 9.0 8.9  MG  --  2.1  --   --  2.2  --    GFR: Estimated Creatinine Clearance: 77.8 mL/min (by C-G formula based on SCr of 0.94 mg/dL). Liver Function Tests: Recent Labs  Lab 11/05/19 1543 11/08/19 1333 11/09/19 0543  AST _1 ALT _2 ALKPHOS 110 99 85  BILITOT 0.2* 0.3 0.2*  PROT 7.3 7.8 6.9  ALBUMIN 3.8 4.1 3.7   Recent Labs  Lab 11/05/19 1543 11/08/19 1333  LIPASE  20 17   No results for input(s): AMMONIA in the last 168 hours. Coagulation Profile: No results for input(s): INR, PROTIME in the last 168 hours. Cardiac Enzymes: No results for input(s): CKTOTAL, CKMB, CKMBINDEX, TROPONINI in the last 168 hours. BNP (last 3 results) No results for input(s): PROBNP in the last 8760 hours. HbA1C: No results for input(s): HGBA1C in the last 72 hours. CBG: No results for input(s): GLUCAP in the last 168 hours. Lipid Profile: No results for input(s): CHOL, HDL, LDLCALC, TRIG, CHOLHDL, LDLDIRECT in the last 72 hours. Thyroid Function Tests: No results for input(s): TSH, T4TOTAL, FREET4, T3FREE, THYROIDAB in the last 72 hours. Anemia Panel: Recent Labs    11/09/19 1055  FERRITIN 8*  TIBC 467*  IRON 32   Urine analysis:    Component Value Date/Time   COLORURINE YELLOW 11/05/2019 [place] 11/05/2019 1546   LABSPEC >1.030 (H) 11/05/2019 1546   PHURINE 6.0 11/05/2019 1546   GLUCOSEU NEGATIVE 11/05/2019 1546   HGBUR NEGATIVE 11/05/2019 1546   BILIRUBINUR NEGATIVE 11/05/2019 1546   BILIRUBINUR neg 05/29/2013 1412   KETONESUR NEGATIVE 11/05/2019 1546   PROTEINUR NEGATIVE 11/05/2019 1546   UROBILINOGEN 0.2 05/29/2013 1412   UROBILINOGEN 0.2 04/08/2012 2330   NITRITE NEGATIVE 11/05/2019 1546   LEUKOCYTESUR NEGATIVE 11/05/2019 1546   Sepsis Labs: _3 (procalcitonin:4,lacticidven:4)  ) Recent Results (from the past 240 hour(s))  GI pathogen panel by PCR, stool     Status: None   Collection Time: 11/06/19  8:30 AM   Specimen: Stool  Result Value Ref Range Status   Plesiomonas shigelloides NOT PERFORMED  Final    Comment: Test not performed   Yersinia enterocolitica NOT PERFORMED  Final    Comment: Test not performed   Vibrio NOT PERFORMED  Final    Comment: Test not performed   Enteropathogenic E coli NOT PERFORMED  Final    Comment: Test not performed   E coli (ETEC) LT/ST NOT PERFORMED  Final    Comment: Test not  performed   E coli 0157 by PCR NOT PERFORMED  Final    Comment: Test not performed   Cryptosporidium by PCR NOT PERFORMED  Final    Comment: Test not performed   Entamoeba histolytica NOT PERFORMED  Final    Comment: Test not performed   Adenovirus F 40/41 NOT PERFORMED  Final    Comment: Test not performed   Norovirus GI/GII NOT PERFORMED  Final    Comment: Test not performed   Sapovirus NOT PERFORMED  Final    Comment: (NOTE) Test not performed Performed At: [facility]  [place], [place] [id number] Rush Farmer MD GY:[id number]    Vibrio cholerae NOT PERFORMED  Final    Comment: Test not performed   Campylobacter by PCR JJK0  Final    Comment: (NOTE) The specimen submitted does not meet the laboratory's criteria for acceptability. Refer to [employer] Services for specimen acceptability criteria. Notified Artist Pais 11/07/2019-[place] 1. Received a REF Stool 2. Need an Orange P/P    Salmonella by PCR NOT PERFORMED  Final    Comment: Test not performed   E coli (STEC) NOT PERFORMED  Final    Comment: Test not performed   Enteroaggregative E coli NOT PERFORMED  Final    Comment: Test not performed   Shigella by PCR NOT PERFORMED  Final    Comment: Test not performed   Cyclospora cayetanensis NOT PERFORMED  Final    Comment: Test not performed   Astrovirus NOT PERFORMED  Final    Comment: Test not performed   G lamblia by PCR NOT PERFORMED  Final    Comment: Test not performed   Rotavirus A by PCR NOT PERFORMED  Final    Comment: Test not performed  SARS CORONAVIRUS 2 (TAT 6-24 HRS) Nasopharyngeal Nasopharyngeal Swab     Status: None   Collection Time: 11/08/19  6:10 PM   Specimen: Nasopharyngeal Swab  Result Value Ref Range Status   SARS Coronavirus 2 NEGATIVE NEGATIVE Final    Comment: (NOTE) SARS-CoV-2 target nucleic acids are NOT DETECTED. The SARS-CoV-2 RNA is generally detectable in upper and lower respiratory  specimens during the acute phase of infection. Negative results do not preclude SARS-CoV-2 infection, do not rule out co-infections with other pathogens, and should not be used as the sole basis for treatment or other patient management decisions. Negative results must be combined with clinical observations, patient history, and epidemiological information. The expected result is Negative. Fact Sheet for Patients: SugarRoll.be Fact Sheet for Healthcare Providers: https://www.woods-mathews.com/ This test is not yet approved or cleared by the [place] FDA and  has been authorized for detection and/or diagnosis of SARS-CoV-2 by FDA under an Emergency Use Authorization (EUA). This EUA will remain  in effect (meaning this test can be used) for the duration of the COVID-19 declaration under Section 56 4(b)(1) of the Act, 21 U.S.C. section 360bbb-3(b)(1), unless the authorization is terminated or revoked sooner. Performed at [facility], [place] [address] [place], [place] [address]   C difficile quick scan w PCR reflex     Status: None   Collection Time: 11/09/19 11:13 AM   Specimen: STOOL  Result Value Ref Range Status   C Diff antigen NEGATIVE NEGATIVE Final   C Diff toxin NEGATIVE NEGATIVE Final   C Diff interpretation No C. difficile detected.  Final    Comment: Performed at [facility], [place] [address], [place], [place] [address]      Studies: No results found.  Scheduled Meds: . dicyclomine  10 mg Oral TID AC & HS  . enoxaparin (LOVENOX) injection  40 mg Subcutaneous QHS  . loratadine  10 mg Oral Daily  . peg 3350 powder  1 kit Oral Once    Continuous Infusions: . potassium chloride 10 mEq (11/11/19 1126)     LOS: 2 days     Kayleen Memos, MD [facility] Pager [phone]  If 7PM-7AM, please contact night-coverage [email or web address] Password [facility] 11/11/2019, 11:57 AM

## 2019-11-12 ENCOUNTER — Inpatient Hospital Stay (HOSPITAL_COMMUNITY): Payer: BC Managed Care – PPO | Admitting: Certified Registered Nurse Anesthetist

## 2019-11-12 ENCOUNTER — Encounter (HOSPITAL_COMMUNITY)
Admission: EM | Disposition: A | Discharge status: Home / Self Care | Payer: Self-pay | Source: Home / Self Care | Attending: Internal Medicine

## 2019-11-12 ENCOUNTER — Encounter (HOSPITAL_COMMUNITY): Payer: Self-pay | Admitting: Internal Medicine

## 2019-11-12 DIAGNOSIS — K640 First degree hemorrhoids: Secondary | ICD-10-CM | POA: Diagnosis not present

## 2019-11-12 DIAGNOSIS — R197 Diarrhea, unspecified: Secondary | ICD-10-CM

## 2019-11-12 DIAGNOSIS — R933 Abnormal findings on diagnostic imaging of other parts of digestive tract: Secondary | ICD-10-CM

## 2019-11-12 DIAGNOSIS — D649 Anemia, unspecified: Secondary | ICD-10-CM | POA: Diagnosis not present

## 2019-11-12 DIAGNOSIS — K6389 Other specified diseases of intestine: Secondary | ICD-10-CM

## 2019-11-12 DIAGNOSIS — K50019 Crohn's disease of small intestine with unspecified complications: Secondary | ICD-10-CM | POA: Diagnosis not present

## 2019-11-12 HISTORY — PX: BIOPSY: SHX5522

## 2019-11-12 HISTORY — PX: COLONOSCOPY WITH PROPOFOL: SHX5780

## 2019-11-12 LAB — CBC
HCT: 31.6 % — ABNORMAL LOW (ref 36.0–46.0)
Hemoglobin: 9.6 g/dL — ABNORMAL LOW (ref 12.0–15.0)
MCH: 21.7 pg — ABNORMAL LOW (ref 26.0–34.0)
MCHC: 30.4 g/dL (ref 30.0–36.0)
MCV: 71.5 fL — ABNORMAL LOW (ref 80.0–100.0)
Platelets: 321 10*3/uL (ref 150–400)
RBC: 4.42 MIL/uL (ref 3.87–5.11)
RDW: 18 % — ABNORMAL HIGH (ref 11.5–15.5)
WBC: 6.1 10*3/uL (ref 4.0–10.5)
nRBC: 0 % (ref 0.0–0.2)

## 2019-11-12 LAB — GI PATHOGEN PANEL BY PCR, STOOL

## 2019-11-12 LAB — BASIC METABOLIC PANEL
Anion gap: 6 (ref 5–15)
BUN: <5 mg/dL — ABNORMAL LOW (ref 6–20)
CO2: 21 mmol/L — ABNORMAL LOW (ref 22–32)
Calcium: 8.8 mg/dL — ABNORMAL LOW (ref 8.9–10.3)
Chloride: 109 mmol/L (ref 98–111)
Creatinine, Ser: 0.85 mg/dL (ref 0.44–1.00)
GFR calc Af Amer: >60 mL/min (ref >60)
GFR calc non Af Amer: >60 mL/min (ref >60)
Glucose, Bld: 90 mg/dL (ref 70–99)
Potassium: 3.4 mmol/L — ABNORMAL LOW (ref 3.5–5.1)
Sodium: 136 mmol/L (ref 135–145)

## 2019-11-12 LAB — OCCULT BLOOD X 1 CARD TO LAB, STOOL: Fecal Occult Bld: NEGATIVE

## 2019-11-12 LAB — HM COLONOSCOPY

## 2019-11-12 SURGERY — COLONOSCOPY WITH PROPOFOL
Anesthesia: Monitor Anesthesia Care

## 2019-11-12 MED ORDER — LACTATED RINGERS IV SOLN: INTRAVENOUS | Status: DC | PRN

## 2019-11-12 MED ORDER — PROPOFOL 500 MG/50ML IV EMUL
INTRAVENOUS | Status: AC
  Filled 2019-11-12: qty 50

## 2019-11-12 MED ORDER — PROPOFOL 10 MG/ML IV BOLUS
INTRAVENOUS | Status: AC
  Filled 2019-11-12: qty 20

## 2019-11-12 MED ORDER — POTASSIUM CHLORIDE 10 MEQ/100ML IV SOLN
10.0000 meq | INTRAVENOUS | Status: AC
  Administered 2019-11-12 (×4): 10 meq via INTRAVENOUS
  Filled 2019-11-12 (×4): qty 100

## 2019-11-12 MED ORDER — PROPOFOL 500 MG/50ML IV EMUL
INTRAVENOUS | Status: DC | PRN
  Administered 2019-11-12: 150 ug/kg/min via INTRAVENOUS

## 2019-11-12 MED ORDER — PROPOFOL 10 MG/ML IV BOLUS
INTRAVENOUS | Status: DC | PRN
  Administered 2019-11-12: 30 mg via INTRAVENOUS

## 2019-11-12 MED ORDER — SODIUM CHLORIDE 0.9% FLUSH: 10.0000 mL | INTRAVENOUS | Status: DC | PRN

## 2019-11-12 SURGICAL SUPPLY — 22 items

## 2019-11-12 NOTE — Transfer of Care (Signed)
Immediate Anesthesia Transfer of Care Note  Patient: Erika David  Procedure(s) Performed: COLONOSCOPY WITH PROPOFOL (N/A ) BIOPSY  Patient Location: Endoscopy Unit  Anesthesia Type:MAC  Level of Consciousness: awake and patient cooperative  Airway & Oxygen Therapy: Patient Spontanous Breathing and Patient connected to face mask  Post-op Assessment: Report given to RN and Post -op Vital signs reviewed and stable  Post vital signs: Reviewed and stable  Last Vitals:  Vitals Value Taken Time  BP    Temp    Pulse 107 11/12/19 1109  Resp 14 11/12/19 1109  SpO2 100 % 11/12/19 1109  Vitals shown include unvalidated device data.  Last Pain:  Vitals:   11/12/19 1109  TempSrc: (P) Temporal  PainSc:       Patients Stated Pain Goal: 3 (17/71/16 5790)  Complications: No apparent anesthesia complications

## 2019-11-12 NOTE — Progress Notes (Signed)
PROGRESS NOTE  Erika David Tutor WRU:[id number] DOB: 1975-09-22 DOA: 11/08/2019 PCP: Martyn Malay, MD  HPI/Recap of past 24 hours: Chronic anemia from menorrhagia.  Patient presented with abdominal pain and diarrhea.  Was seen at [facility].  Discharged home with oral Flagyl.  Came back for a follow-up and continued to have the same symptoms.  CT scan shows pancolitis.  Cdiff pcr negative.  GI panel in process.  Seen by GI plan for colonoscopy on 3/4.  11/12/19: Seen and examined.  Bowel prep overnight.  Reports diffuse abdominal cramping.  No nausea.   Assessment/Plan: Principal Problem:   Pancolitis (HCC) Active Problems:   Anemia   Generalized weakness  Pancolitis on CT with persistent diarrhea. Appreciate [facility] input. C. difficile PCR negative GI pathogen panel in process Plan colonoscopy on 3/4.  Will follow biopsy along with GI. Has abdominal cramping, defer management to GI.  Chronic microcytic anemia/ iron deficiency anemia from likely menorrhagia Evidence of significant iron deficiency on iron studies Received IV Feraheme 510 mg x1 on 3/3. Holding off p.o. iron supplement in the setting of acute GI illness Defer to GI to start p.o. iron supplement  Refractory hypokalemia secondary to GI losses from diarrhea Potassium 3.4>3.4 Repleted with IV potassium supplement Last magnesium 2.2 on 3/2  Obesity BMI 34 Recommend weight loss outpatient with regular physical activity and healthy dieting  Acute hemoglobin drop Mild hemoglobin drop from 9.8 to 9.6 Suspect dilutional in the setting of IV fluid hydration IV fluid has been discontinued today Obtain FOBT to rule out GI source for anemia     Diet: Full liquid diet, n.p.o. after midnight.  Advance diet as tolerated after planned colonoscopy. DVT Prophylaxis: Subcutaneous Lovenox  daily  Advance goals of care discussion: Full code  Family Communication:  Updated her sister via phone.   Disposition:  Pt is from home, anticipate discharge to home after colonoscopy, improvement of diarrhea, and when GI signs off.      Objective: Vitals:   11/11/19 2001 11/11/19 2100 11/12/19 0523 11/12/19 0934  BP: (!) 143/92  (!) 143/88 (!) 138/92  Pulse: 72  81 81  Resp: 18  18 17   Temp: (!) 97.5 F (36.4 C) 98.2 F (36.8 C) 97.7 F (36.5 C) 98.2 F (36.8 C)  TempSrc: Oral  Oral Oral  SpO2: 99%  95% 98%  Weight:      Height:        Intake/Output Summary (Last 24 hours) at 11/12/2019 0942 Last data filed at 11/11/2019 1559 Gross per 24 hour  Intake 540 ml  Output -  Net 540 ml   Filed Weights   11/08/19 1317 11/10/19 2032  Weight: 86.2 kg 84.5 kg    Exam:  . General: 44 y.o. year-old female well-developed well-nourished in no acute distress.  Alert and oriented x3.   . Cardiovascular: Regular rate and rhythm no rubs or gallops.  No JVD or thyromegaly noted. [name] Respiratory: Clear to auscultation no wheezes no rales.   . Abdomen: Soft diffuse tenderness with palpation.  Bowel sounds present. . Musculoskeletal: No lower extremity edema bilaterally. [name] Psychiatry: Mood is appropriate for condition and setting.  Data Reviewed: CBC: Recent Labs  Lab 11/05/19 1607 11/05/19 1607 11/08/19 1333 11/09/19 0543 11/10/19 0520 11/11/19 0533 11/12/19 0344  WBC 7.4   < > 7.1 6.5 6.4 6.2 6.1  NEUTROABS 4.4  --   --   --   --   --   --  HGB 9.7*   < > 10.3* 9.4* 9.6* 9.8* 9.6*  HCT 32.2*   < > 34.4* 32.6* 32.4* 32.7* 31.6*  MCV 72.0*   < > 72.0* 74.1* 72.6* 71.4* 71.5*  PLT 343   < > 374 331 327 317 321   < > = values in this interval not displayed.   Basic Metabolic Panel: Recent Labs  Lab 11/05/19 1543 11/05/19 1607 11/08/19 1333 11/09/19 0543 11/10/19 0520 11/11/19 0533 11/12/19 0344  NA   < >  --  135 139 137 137 136  K   < >  --  3.8 3.6 3.5 3.4* 3.4*  CL   < >  --  103 107 105 106 109  CO2   < >  --  24 23 22 23  21*  GLUCOSE   < >  --  91 95 99 93 90  BUN    < >  --  5* <5* <5* <5* <5*  CREATININE   < >  --  0.83 0.74 0.91 0.94 0.85  CALCIUM   < >  --  9.1 8.6* 9.0 8.9 8.8*  MG  --  2.1  --   --  2.2  --   --    < > = values in this interval not displayed.   GFR: Estimated Creatinine Clearance: 86 mL/min (by C-G formula based on SCr of 0.85 mg/dL). Liver Function Tests: Recent Labs  Lab 11/05/19 1543 11/08/19 1333 11/09/19 0543  AST 16 19 20   ALT 12 12 14   ALKPHOS 110 99 85  BILITOT 0.2* 0.3 0.2*  PROT 7.3 7.8 6.9  ALBUMIN 3.8 4.1 3.7   Recent Labs  Lab 11/05/19 1543 11/08/19 1333  LIPASE 20 17   No results for input(s): AMMONIA in the last 168 hours. Coagulation Profile: No results for input(s): INR, PROTIME in the last 168 hours. Cardiac Enzymes: No results for input(s): CKTOTAL, CKMB, CKMBINDEX, TROPONINI in the last 168 hours. BNP (last 3 results) No results for input(s): PROBNP in the last 8760 hours. HbA1C: No results for input(s): HGBA1C in the last 72 hours. CBG: No results for input(s): GLUCAP in the last 168 hours. Lipid Profile: No results for input(s): CHOL, HDL, LDLCALC, TRIG, CHOLHDL, LDLDIRECT in the last 72 hours. Thyroid Function Tests: No results for input(s): TSH, T4TOTAL, FREET4, T3FREE, THYROIDAB in the last 72 hours. Anemia Panel: Recent Labs    11/09/19 1055  FERRITIN 8*  TIBC 467*  IRON 32   Urine analysis:    Component Value Date/Time   COLORURINE YELLOW 11/05/2019 1546   APPEARANCEUR CLEAR 11/05/2019 1546   LABSPEC >1.030 (H) 11/05/2019 1546   PHURINE 6.0 11/05/2019 1546   GLUCOSEU NEGATIVE 11/05/2019 1546   HGBUR NEGATIVE 11/05/2019 1546   BILIRUBINUR NEGATIVE 11/05/2019 1546   BILIRUBINUR neg 05/29/2013 1412   KETONESUR NEGATIVE 11/05/2019 1546   PROTEINUR NEGATIVE 11/05/2019 1546   UROBILINOGEN 0.2 05/29/2013 1412   UROBILINOGEN 0.2 04/08/2012 2330   NITRITE NEGATIVE 11/05/2019 1546   LEUKOCYTESUR NEGATIVE 11/05/2019 1546   Sepsis Labs:  @LABRCNTIP (procalcitonin:4,lacticidven:4)  ) Recent Results (from the past 240 hour(s))  GI pathogen panel by PCR, stool     Status: None   Collection Time: 11/06/19  8:30 AM   Specimen: Stool  Result Value Ref Range Status   Plesiomonas shigelloides NOT PERFORMED  Final    Comment: Test not performed   Yersinia enterocolitica NOT PERFORMED  Final    Comment: Test not performed   Vibrio  NOT PERFORMED  Final    Comment: Test not performed   Enteropathogenic E coli NOT PERFORMED  Final    Comment: Test not performed   E coli (ETEC) LT/ST NOT PERFORMED  Final    Comment: Test not performed   E coli 0157 by PCR NOT PERFORMED  Final    Comment: Test not performed   Cryptosporidium by PCR NOT PERFORMED  Final    Comment: Test not performed   Entamoeba histolytica NOT PERFORMED  Final    Comment: Test not performed   Adenovirus F 40/41 NOT PERFORMED  Final    Comment: Test not performed   Norovirus GI/GII NOT PERFORMED  Final    Comment: Test not performed   Sapovirus NOT PERFORMED  Final    Comment: (NOTE) Test not performed Performed At: [facility] [place], [place] [id number] Rush Farmer MD QQ:[id number]    Vibrio cholerae NOT PERFORMED  Final    Comment: Test not performed   Campylobacter by PCR HER7  Final    Comment: (NOTE) The specimen submitted does not meet the laboratory's criteria for acceptability. Refer to [employer] of Services for specimen acceptability criteria. Notified Artist Pais 11/07/2019-[place] 1. Received a REF Stool 2. Need an Orange P/P    Salmonella by PCR NOT PERFORMED  Final    Comment: Test not performed   E coli (STEC) NOT PERFORMED  Final    Comment: Test not performed   Enteroaggregative E coli NOT PERFORMED  Final    Comment: Test not performed   Shigella by PCR NOT PERFORMED  Final    Comment: Test not performed   Cyclospora cayetanensis NOT PERFORMED  Final    Comment: Test not performed    Astrovirus NOT PERFORMED  Final    Comment: Test not performed   G lamblia by PCR NOT PERFORMED  Final    Comment: Test not performed   Rotavirus A by PCR NOT PERFORMED  Final    Comment: Test not performed  SARS CORONAVIRUS 2 (TAT 6-24 HRS) Nasopharyngeal Nasopharyngeal Swab     Status: None   Collection Time: 11/08/19  6:10 PM   Specimen: Nasopharyngeal Swab  Result Value Ref Range Status   SARS Coronavirus 2 NEGATIVE NEGATIVE Final    Comment: (NOTE) SARS-CoV-2 target nucleic acids are NOT DETECTED. The SARS-CoV-2 RNA is generally detectable in upper and lower respiratory specimens during the acute phase of infection. Negative results do not preclude SARS-CoV-2 infection, do not rule out co-infections with other pathogens, and should not be used as the sole basis for treatment or other patient management decisions. Negative results must be combined with clinical observations, patient history, and epidemiological information. The expected result is Negative. Fact Sheet for Patients: SugarRoll.be Fact Sheet for Healthcare Providers: https://www.woods-mathews.com/ This test is not yet approved or cleared by the [place] FDA and  has been authorized for detection and/or diagnosis of SARS-CoV-2 by FDA under an Emergency Use Authorization (EUA). This EUA will remain  in effect (meaning this test can be used) for the duration of the COVID-19 declaration under Section 56 4(b)(1) of the Act, 21 U.S.C. section 360bbb-3(b)(1), unless the authorization is terminated or revoked sooner. Performed at [facility], [place] [address], [place], [place] [address]   C difficile quick scan w PCR reflex     Status: None   Collection Time: 11/09/19 11:13 AM   Specimen: STOOL  Result Value Ref Range Status   C Diff  antigen NEGATIVE NEGATIVE Final   C Diff toxin NEGATIVE NEGATIVE Final   C Diff interpretation No C. difficile detected.  Final     Comment: Performed at [facility], [place] [address] [place], [place] [address]      Studies: No results found.  Scheduled Meds: . [MAR Hold] dicyclomine  10 mg Oral TID AC & HS  . [MAR Hold] loratadine  10 mg Oral Daily    Continuous Infusions: . sodium chloride    . potassium chloride 10 mEq (11/12/19 0850)     LOS: 3 days     Kayleen Memos, MD [facility] Pager [phone]  If 7PM-7AM, please contact night-coverage [email or web address] Password [facility] 11/12/2019, 9:42 AM

## 2019-11-12 NOTE — Anesthesia Postprocedure Evaluation (Signed)
Anesthesia Post Note  Patient: Erika David  Procedure(s) Performed: COLONOSCOPY WITH PROPOFOL (N/A ) BIOPSY     Patient location during evaluation: Endoscopy Anesthesia Type: MAC Level of consciousness: awake Pain management: pain level controlled Vital Signs Assessment: post-procedure vital signs reviewed and stable Respiratory status: spontaneous breathing Cardiovascular status: stable Postop Assessment: no apparent nausea or vomiting Anesthetic complications: no    Last Vitals:  Vitals:   11/12/19 1110 11/12/19 1120  BP: 120/86 138/83  Pulse: (!) 107 88  Resp: 14 16  Temp:    SpO2: 100% 100%    Last Pain:  Vitals:   11/12/19 1109  TempSrc: Temporal  PainSc:    Pain Goal: Patients Stated Pain Goal: 3 (11/11/19 0747)                 Huston Foley

## 2019-11-12 NOTE — Anesthesia Preprocedure Evaluation (Signed)
Anesthesia Evaluation  Patient identified by MRN, date of birth, ID band Patient awake    Reviewed: Allergy & Precautions, NPO status , Patient's Chart, lab work & pertinent test results  Airway Mallampati: I       Dental no notable dental hx. (+) Teeth Intact   Pulmonary neg pulmonary ROS,    Pulmonary exam normal breath sounds clear to auscultation       Cardiovascular negative cardio ROS Normal cardiovascular exam Rhythm:Regular Rate:Normal     Neuro/Psych negative neurological ROS  negative psych ROS   GI/Hepatic Neg liver ROS,   Endo/Other  negative endocrine ROS  Renal/GU negative Renal ROS  negative genitourinary   Musculoskeletal   Abdominal (+) + obese,   Peds  Hematology  (+) Blood dyscrasia, anemia ,   Anesthesia Other Findings   Reproductive/Obstetrics                             Anesthesia Physical Anesthesia Plan  ASA: II  Anesthesia Plan: MAC   Post-op Pain Management:    Induction:   PONV Risk Score and Plan: 2 and Propofol infusion  Airway Management Planned: Nasal Cannula, Natural Airway and Simple Face Mask  Additional Equipment: None  Intra-op Plan:   Post-operative Plan:   Informed Consent: I have reviewed the patients History and Physical, chart, labs and discussed the procedure including the risks, benefits and alternatives for the proposed anesthesia with the patient or authorized representative who has indicated his/her understanding and acceptance.       Plan Discussed with: CRNA  Anesthesia Plan Comments:         Anesthesia Quick Evaluation

## 2019-11-12 NOTE — Interval H&P Note (Signed)
History and Physical Interval Note:  11/12/2019 10:06 AM  Katlin L Coatney  has presented today for surgery, with the diagnosis of Colitis, diarrhea, rectal bleeding.  The various methods of treatment have been discussed with the patient and family. After consideration of risks, benefits and other options for treatment, the patient has consented to  Procedure(s): COLONOSCOPY WITH PROPOFOL (N/A) as a surgical intervention.  The patient's history has been reviewed, patient examined, no change in status, stable for surgery.  I have reviewed the patient's chart and labs.  Questions were answered to the patient's satisfaction.     Pricilla Riffle. Fuller Plan

## 2019-11-12 NOTE — Op Note (Signed)
[facility] Patient Name: Erika David Procedure Date: 11/12/2019 MRN: [id number] Attending MD: Ladene Artist , MD Date of Birth: Aug 08, 1976 CSN: [id number] Age: 44 Admit Type: Outpatient Procedure:                Colonoscopy Indications:              Clinically significant diarrhea of unexplained                            origin, Abnormal CT of the GI tract (thickened                            colon) Providers:                Pricilla Riffle. Fuller Plan, MD, Cleda Daub, RN, Lina Sar, Technician, Dellie Catholic Referring MD:             [facility] Medicines:                Monitored Anesthesia Care Complications:            No immediate complications. Estimated Blood Loss:     Estimated blood loss was minimal. Procedure:                Pre-Anesthesia Assessment:                           - Prior to the procedure, a History and Physical                            was performed, and patient medications and                            allergies were reviewed. The patient's tolerance of                            previous anesthesia was also reviewed. The risks                            and benefits of the procedure and the sedation                            options and risks were discussed with the patient.                            All questions were answered, and informed consent                            was obtained. Prior Anticoagulants: The patient has                            taken no previous anticoagulant or antiplatelet                            agents. ASA  Grade Assessment: II - A patient with                            mild systemic disease. After reviewing the risks                            and benefits, the patient was deemed in                            satisfactory condition to undergo the procedure.                           After obtaining informed consent, the colonoscope                            was passed under direct  vision. Throughout the                            procedure, the patient's blood pressure, pulse, and                            oxygen saturations were monitored continuously. The                            [id number]-H190DL ([id number] Olympus pediatric colonscope                            was introduced through the anus and advanced to the                            the terminal ileum, with identification of the                            appendiceal orifice and IC valve. The patient                            tolerated the procedure well. The colonoscopy was                            somewhat difficult due to significant looping and a                            tortuous colon. Scope In: 10:43:44 AM Scope Out: 11:01:25 AM Scope Withdrawal Time: 0 hours 10 minutes 27 seconds  Total Procedure Duration: 0 hours 17 minutes 41 seconds  Findings:      The perianal and digital rectal examinations were normal.      The terminal ileum appeared normal.      The area from cecum to transverse colon and sigmoid to the rectum       appeared normal. Biopsies were taken with a cold forceps for histology.      An area of mildly congested mucosa was found in the descending colon.       This was biopsied with a cold forceps for histology.      Internal  hemorrhoids were found during retroflexion. The hemorrhoids       were small and Grade I (internal hemorrhoids that do not prolapse). Impression:               - The examined portion of the ileum was normal.                           - The cecum to transverse colon and sigmoid to                            rectum appeared normal. Biopsied.                           - Mildly congested mucosa in the descending colon.                            Biopsied.                           - Internal hemorrhoids. Moderate Sedation:      Not Applicable - Patient had care per Anesthesia. Recommendation:           - Return patient to hospital ward for ongoing care.                            - Full liquid diet today.                           - Continue present medications.                           - Await pathology results. Procedure Code(s):        --- Professional ---                           [phone], Colonoscopy, flexible; with biopsy, single                            or multiple Diagnosis Code(s):        --- Professional ---                           K64.0, First degree hemorrhoids                           K63.89, Other specified diseases of intestine                           R19.7, Diarrhea, unspecified                           R93.3, Abnormal findings on diagnostic imaging of                            other parts of digestive tract CPT copyright 2019 American Medical Association. All rights reserved. The codes documented in this report are preliminary and upon coder review may  be revised to meet  current compliance requirements. Ladene Artist, MD 11/12/2019 11:10:31 AM This report has been signed electronically. Number of Addenda: 0

## 2019-11-13 ENCOUNTER — Encounter: Payer: Self-pay | Admitting: Family Medicine

## 2019-11-13 DIAGNOSIS — R933 Abnormal findings on diagnostic imaging of other parts of digestive tract: Secondary | ICD-10-CM

## 2019-11-13 DIAGNOSIS — K52832 Lymphocytic colitis: Secondary | ICD-10-CM | POA: Insufficient documentation

## 2019-11-13 LAB — BASIC METABOLIC PANEL
Anion gap: 6 (ref 5–15)
BUN: <5 mg/dL — ABNORMAL LOW (ref 6–20)
CO2: 22 mmol/L (ref 22–32)
Calcium: 9.3 mg/dL (ref 8.9–10.3)
Chloride: 111 mmol/L (ref 98–111)
Creatinine, Ser: 0.83 mg/dL (ref 0.44–1.00)
GFR calc Af Amer: >60 mL/min (ref >60)
GFR calc non Af Amer: >60 mL/min (ref >60)
Glucose, Bld: 92 mg/dL (ref 70–99)
Potassium: 3.8 mmol/L (ref 3.5–5.1)
Sodium: 139 mmol/L (ref 135–145)

## 2019-11-13 LAB — CBC
HCT: 31.1 % — ABNORMAL LOW (ref 36.0–46.0)
Hemoglobin: 9.5 g/dL — ABNORMAL LOW (ref 12.0–15.0)
MCH: 22.1 pg — ABNORMAL LOW (ref 26.0–34.0)
MCHC: 30.5 g/dL (ref 30.0–36.0)
MCV: 72.3 fL — ABNORMAL LOW (ref 80.0–100.0)
Platelets: 298 10*3/uL (ref 150–400)
RBC: 4.3 MIL/uL (ref 3.87–5.11)
RDW: 18.7 % — ABNORMAL HIGH (ref 11.5–15.5)
WBC: 6.9 10*3/uL (ref 4.0–10.5)
nRBC: 0 % (ref 0.0–0.2)

## 2019-11-13 LAB — SURGICAL PATHOLOGY

## 2019-11-13 MED ORDER — BUDESONIDE 3 MG PO CPEP
9.0000 mg | ORAL_CAPSULE | Freq: Every day | ORAL | Status: DC
  Administered 2019-11-13 – 2019-11-15 (×3): 9 mg via ORAL
  Filled 2019-11-13 (×3): qty 3

## 2019-11-13 MED ORDER — LOPERAMIDE HCL 2 MG PO CAPS: 2.0000 mg | ORAL_CAPSULE | Freq: Three times a day (TID) | ORAL | Status: DC | PRN

## 2019-11-13 NOTE — Progress Notes (Addendum)
     [facility] Progress Note  CC:  Colitis, diarrhea  Subjective: Diarrhea persists  - The examined portion of the ileum was normal. - The cecum to transverse colon and sigmoid to rectum appeared normal. Biopsied. - Mildly congested mucosa in the descending colon. Biopsied. - Internal hemorrhoids.   Objective:  Vital signs in last 24 hours: Temp:  [98 F (36.7 C)-98.5 F (36.9 C)] 98 F (36.7 C) (03/05 0505) Pulse Rate:  [71-107] 78 (03/05 0505) Resp:  [14-18] 16 (03/05 0505) BP: (115-138)/(79-93) 134/86 (03/05 0505) SpO2:  [97 %-100 %] 99 % (03/05 0505) Last BM Date: 11/12/19 General:   Alert,  Well-developed,    in NAD Heart:  Regular rate and rhythm; no murmurs Pulm; Abdomen:  Soft, nontender and nondistended. Normal bowel sounds, without guarding, and without rebound.   Extremities:  Without edema. Neurologic:  Alert and  oriented x4;  grossly normal neurologically. Psych:  Alert and cooperative. Normal mood and affect.  Intake/Output from previous day: 03/04 0701 - 03/05 0700 In: 1967.7 [P.O.:960; I.V.:650; IV Piggyback:357.7] Out: -   Lab Results: Recent Labs    11/11/19 0533 11/12/19 0344 11/13/19 0612  WBC 6.2 6.1 6.9  HGB 9.8* 9.6* 9.5*  HCT 32.7* 31.6* 31.1*  PLT 317 321 298   BMET Recent Labs    11/11/19 0533 11/12/19 0344 11/13/19 0612  NA 137 136 139  K 3.4* 3.4* 3.8  CL 106 109 111  CO2 23 21* 22  GLUCOSE 93 90 92  BUN <5* <5* <5*  CREATININE 0.94 0.85 0.83  CALCIUM 8.9 8.8* 9.3    Assessment / Plan: 1. Persistent diarrhea. GI pathogen panel negative, C diff negative. Colonoscopy essentially normal with biopsies pending. Add Imodium tid prn and if not adequate add cholestyramine or Colestipol.   2. IDA.  Received dose of Feraheme. Repeat Feraheme as outpatient in 1 week.  Hgb stable.   LOS: 4 days   Laban Emperor. Zehr  11/13/2019, 9:40 AM      Attending Physician Note   I have taken an interval history, reviewed  the chart and examined the patient. I agree with the Advanced Practitioner's note, impression and recommendations.   * Persistent diarrhea. Stool studies negative and colonoscopy essentially normal. Pathology showed lymphocytic colitis.    Begin Entocort 9 mg po qd Advance diet to lastose free. She is lactose intolerant  Continue dicyclomine 10 mg ac & hs  Add Imodium tid prn  Add Colestipol bid if diarrhea not controlled with Imodium OK for discharge when diarrhea better controlled, possibly later today or tomorrow  Outpatient GI follow up in 2-3 weeks   * IDA, stable. Repeat Feraheme in 1 week as outpatient. Outpatient follow up with PCP and GYN.   [name], MD [facility]

## 2019-11-13 NOTE — Discharge Instructions (Signed)
Colitis  Colitis is inflammation of the colon. Colitis may last a short time (be acute), or it may last a long time (become chronic). What are the causes? This condition may be caused by:  Viruses.  Bacteria.  Reaction to medicine.  Certain autoimmune diseases such as Crohn's disease or ulcerative colitis.  Radiation treatment.  Decreased blood flow to the bowel (ischemia). What are the signs or symptoms? Symptoms of this condition include:  Watery diarrhea.  Passing bloody or tarry stool.  Pain.  Fever.  Vomiting.  Tiredness (fatigue).  Weight loss.  Bloating.  Abdominal pain.  Having fewer bowel movements than usual.  A strong and sudden urge to have a bowel movement.  Feeling like the bowel is not empty after a bowel movement. How is this diagnosed? This condition is diagnosed with a stool test or a blood test. You may also have other tests, such as:  X-rays.  CT scan.  Colonoscopy.  Endoscopy.  Biopsy. How is this treated? Treatment for this condition depends on the cause. The condition may be treated by:  Resting the bowel. This involves not eating or drinking for a period of time.  Fluids that are given through an IV.  Medicine for pain and diarrhea.  Antibiotic medicines.  Cortisone medicines.  Surgery. Follow these instructions at home: Eating and drinking   Follow instructions from your health care provider about eating or drinking restrictions.  Drink enough fluid to keep your urine pale yellow.  Work with a dietitian to determine which foods cause your condition to flare up.  Avoid foods that cause flare-ups.  Eat a well-balanced diet. General instructions  If you were prescribed an antibiotic medicine, take it as told by your health care provider. Do not stop taking the antibiotic even if you start to feel better.  Take over-the-counter and prescription medicines only as told by your health care provider.  Keep all  follow-up visits as told by your health care provider. This is important. Contact a health care provider if:  Your symptoms do not go away.  You develop new symptoms. Get help right away if you:  Have a fever that does not go away with treatment.  Develop chills.  Have extreme weakness, fainting, or dehydration.  Have repeated vomiting.  Develop severe pain in your abdomen.  Lauf bloody or tarry stool. Summary  Colitis is inflammation of the colon. Colitis may last a short time (be acute), or it may last a long time (become chronic).  Treatment for this condition depends on the cause and may include resting the bowel, taking medicines, or having surgery.  If you were prescribed an antibiotic medicine, take it as told by your health care provider. Do not stop taking the antibiotic even if you start to feel better.  Get help right away if you develop severe pain in your abdomen.  Keep all follow-up visits as told by your health care provider. This is important. This information is not intended to replace advice given to you by your health care provider. Make sure you discuss any questions you have with your health care provider. Document Revised: 02/27/2018 Document Reviewed: 02/27/2018 Elsevier Patient Education  2020 [employer].

## 2019-11-13 NOTE — Progress Notes (Signed)
PROGRESS NOTE  Erika David WUG:[id number] DOB: 05-05-1976 DOA: 11/08/2019 PCP: Martyn Malay, MD  HPI/Recap of past 24 hours: Chronic anemia from menorrhagia.  Patient presented with abdominal pain and diarrhea.  Was seen at [facility].  Discharged home with oral Flagyl.  Came back for a follow-up and continued to have the same symptoms.  CT scan shows pancolitis.  Cdiff pcr negative.  GI panel in process.  Seen by GI plan for colonoscopy on 3/4.  11/13/19: Seen and examined.  Post colonoscopy on 11/12/2019 by Dr. Fuller Plan.  Pathology revealed lymphocytic colitis.  Started on new medications recommended by GI.    Assessment/Plan: Principal Problem:   Pancolitis (HCC) Active Problems:   Anemia   Generalized weakness   Diarrhea   Abnormal CT scan, colon  Newly diagnosed lymphocytic colitis  Presented with pancolitis on CT with persistent diarrhea. Post colonoscopy on 11/12/2019 by Dr. Fuller Plan colonoscopy essentially normal however pathology showed lymphocytic colitis. Appreciate [facility] assistance C. difficile PCR negative GI pathogen panel negative Recommendations per GI:  Begin Entocort 9 mg po qd Advance diet to lastose free. She is lactose intolerant  Continue dicyclomine 10 mg ac & hs  Add Imodium tid prn  Add Colestipol bid if diarrhea not controlled with Imodium OK for discharge when diarrhea better controlled, possibly later today or tomorrow  Outpatient GI follow up in 2-3 weeks   * IDA, stable. Repeat Feraheme in 1 week as outpatient. Outpatient follow up with PCP and GYN.   Chronic microcytic anemia/ iron deficiency anemia from likely menorrhagia Evidence of significant iron deficiency on iron studies Received IV Feraheme 510 mg x1 on 3/3. Holding off p.o. iron supplement in the setting of acute GI illness Repeat Feraheme in 1 week as outpatient as recommended by GI.  Outpatient follow-up with PCP or GYN.  Resolved post repletion: Refractory hypokalemia  secondary to GI losses from diarrhea Potassium 3.4>3.4>> 3.8 Last magnesium 2.2 on 3/2  Obesity BMI 34 Recommend weight loss outpatient with regular physical activity and healthy dieting  Acute hemoglobin drop, stable Mild hemoglobin drop from 9.8 to 9.6>9.5 FOBT negative. Suspect dilutional in the setting of IV fluid hydration.     Diet:  Regular consistency diet.  DVT Prophylaxis:  SCD  Advance goals of care discussion: Full code  Family Communication:  Updated her sister via phone 3/3.  Disposition:  Pt is from home, anticipate discharge to home after likely tomorrow once diarrhea is controlled.    Objective: Vitals:   11/12/19 1445 11/12/19 2135 11/13/19 0505 11/13/19 1417  BP: 120/79 (!) 138/92 134/86 130/84  Pulse: 79 71 78 98  Resp: 18 16 16 18   Temp: 98.5 F (36.9 C) 98 F (36.7 C) 98 F (36.7 C) 98.4 F (36.9 C)  TempSrc: Oral   Oral  SpO2: 98% 97% 99% 99%  Weight:      Height:        Intake/Output Summary (Last 24 hours) at 11/13/2019 1443 Last data filed at 11/12/2019 1843 Gross per 24 hour  Intake 837.72 ml  Output --  Net 837.72 ml   Filed Weights   11/08/19 1317 11/10/19 2032  Weight: 86.2 kg 84.5 kg    Exam:  . General: 44 y.o. year-old female well-developed well-nourished no acute distress.  Alert and oriented x3.   . Cardiovascular: Regular rate and rhythm no rubs or gallops.   [name] Respiratory: Clear to auscultation no wheezes no rales.   . Abdomen: Soft  bowel sounds present.   . Musculoskeletal: No lower extremity edema.   [name] Psychiatry: Mood is appropriate for condition and setting.   Data Reviewed: CBC: Recent Labs  Lab 11/09/19 0543 11/10/19 0520 11/11/19 0533 11/12/19 0344 11/13/19 0612  WBC 6.5 6.4 6.2 6.1 6.9  HGB 9.4* 9.6* 9.8* 9.6* 9.5*  HCT 32.6* 32.4* 32.7* 31.6* 31.1*  MCV 74.1* 72.6* 71.4* 71.5* 72.3*  PLT 331 327 317 321 929   Basic Metabolic Panel: Recent Labs  Lab 11/09/19 0543 11/10/19 0520  11/11/19 0533 11/12/19 0344 11/13/19 0612  NA 139 137 137 136 139  K 3.6 3.5 3.4* 3.4* 3.8  CL 107 105 106 109 111  CO2 23 22 23  21* 22  GLUCOSE 95 99 93 90 92  BUN <5* <5* <5* <5* <5*  CREATININE 0.74 0.91 0.94 0.85 0.83  CALCIUM 8.6* 9.0 8.9 8.8* 9.3  MG  --  2.2  --   --   --    GFR: Estimated Creatinine Clearance: 88.1 mL/min (by C-G formula based on SCr of 0.83 mg/dL). Liver Function Tests: Recent Labs  Lab 11/08/19 1333 11/09/19 0543  AST 19 20  ALT 12 14  ALKPHOS 99 85  BILITOT 0.3 0.2*  PROT 7.8 6.9  ALBUMIN 4.1 3.7   Recent Labs  Lab 11/08/19 1333  LIPASE 17   No results for input(s): AMMONIA in the last 168 hours. Coagulation Profile: No results for input(s): INR, PROTIME in the last 168 hours. Cardiac Enzymes: No results for input(s): CKTOTAL, CKMB, CKMBINDEX, TROPONINI in the last 168 hours. BNP (last 3 results) No results for input(s): PROBNP in the last 8760 hours. HbA1C: No results for input(s): HGBA1C in the last 72 hours. CBG: No results for input(s): GLUCAP in the last 168 hours. Lipid Profile: No results for input(s): CHOL, HDL, LDLCALC, TRIG, CHOLHDL, LDLDIRECT in the last 72 hours. Thyroid Function Tests: No results for input(s): TSH, T4TOTAL, FREET4, T3FREE, THYROIDAB in the last 72 hours. Anemia Panel: No results for input(s): VITAMINB12, FOLATE, FERRITIN, TIBC, IRON, RETICCTPCT in the last 72 hours. Urine analysis:    Component Value Date/Time   COLORURINE YELLOW 11/05/2019 1546   APPEARANCEUR CLEAR 11/05/2019 1546   LABSPEC >1.030 (H) 11/05/2019 1546   PHURINE 6.0 11/05/2019 1546   GLUCOSEU NEGATIVE 11/05/2019 1546   HGBUR NEGATIVE 11/05/2019 1546   BILIRUBINUR NEGATIVE 11/05/2019 1546   BILIRUBINUR neg 05/29/2013 1412   KETONESUR NEGATIVE 11/05/2019 1546   PROTEINUR NEGATIVE 11/05/2019 1546   UROBILINOGEN 0.2 05/29/2013 1412   UROBILINOGEN 0.2 04/08/2012 2330   NITRITE NEGATIVE 11/05/2019 1546   LEUKOCYTESUR NEGATIVE  11/05/2019 1546   Sepsis Labs: @LABRCNTIP (procalcitonin:4,lacticidven:4)  ) Recent Results (from the past 240 hour(s))  GI pathogen panel by PCR, stool     Status: None   Collection Time: 11/06/19  8:30 AM   Specimen: Stool  Result Value Ref Range Status   Plesiomonas shigelloides NOT PERFORMED  Final    Comment: Test not performed   Yersinia enterocolitica NOT PERFORMED  Final    Comment: Test not performed   Vibrio NOT PERFORMED  Final    Comment: Test not performed   Enteropathogenic E coli NOT PERFORMED  Final    Comment: Test not performed   E coli (ETEC) LT/ST NOT PERFORMED  Final    Comment: Test not performed   E coli 0157 by PCR NOT PERFORMED  Final    Comment: Test not performed   Cryptosporidium by PCR NOT PERFORMED  Final  Comment: Test not performed   Entamoeba histolytica NOT PERFORMED  Final    Comment: Test not performed   Adenovirus F 40/41 NOT PERFORMED  Final    Comment: Test not performed   Norovirus GI/GII NOT PERFORMED  Final    Comment: Test not performed   Sapovirus NOT PERFORMED  Final    Comment: (NOTE) Test not performed Performed At: [facility] [place], [place] [id number] Rush Farmer MD PY:[id number]    Vibrio cholerae NOT PERFORMED  Final    Comment: Test not performed   Campylobacter by PCR IWP8  Final    Comment: (NOTE) The specimen submitted does not meet the laboratory's criteria for acceptability. Refer to [employer] of Services for specimen acceptability criteria. Notified Artist Pais 11/07/2019-[place] 1. Received a REF Stool 2. Need an Orange P/P    Salmonella by PCR NOT PERFORMED  Final    Comment: Test not performed   E coli (STEC) NOT PERFORMED  Final    Comment: Test not performed   Enteroaggregative E coli NOT PERFORMED  Final    Comment: Test not performed   Shigella by PCR NOT PERFORMED  Final    Comment: Test not performed   Cyclospora cayetanensis NOT PERFORMED  Final     Comment: Test not performed   Astrovirus NOT PERFORMED  Final    Comment: Test not performed   G lamblia by PCR NOT PERFORMED  Final    Comment: Test not performed   Rotavirus A by PCR NOT PERFORMED  Final    Comment: Test not performed  SARS CORONAVIRUS 2 (TAT 6-24 HRS) Nasopharyngeal Nasopharyngeal Swab     Status: None   Collection Time: 11/08/19  6:10 PM   Specimen: Nasopharyngeal Swab  Result Value Ref Range Status   SARS Coronavirus 2 NEGATIVE NEGATIVE Final    Comment: (NOTE) SARS-CoV-2 target nucleic acids are NOT DETECTED. The SARS-CoV-2 RNA is generally detectable in upper and lower respiratory specimens during the acute phase of infection. Negative results do not preclude SARS-CoV-2 infection, do not rule out co-infections with other pathogens, and should not be used as the sole basis for treatment or other patient management decisions. Negative results must be combined with clinical observations, patient history, and epidemiological information. The expected result is Negative. Fact Sheet for Patients: SugarRoll.be Fact Sheet for Healthcare Providers: https://www.woods-mathews.com/ This test is not yet approved or cleared by the [place] FDA and  has been authorized for detection and/or diagnosis of SARS-CoV-2 by FDA under an Emergency Use Authorization (EUA). This EUA will remain  in effect (meaning this test can be used) for the duration of the COVID-19 declaration under Section 56 4(b)(1) of the Act, 21 U.S.C. section 360bbb-3(b)(1), unless the authorization is terminated or revoked sooner. Performed at [facility], [place] [address] [place], [place] [address]   GI pathogen panel by PCR, stool     Status: None   Collection Time: 11/09/19 11:13 AM   Specimen: Stool  Result Value Ref Range Status   Plesiomonas shigelloides NOT DETECTED NOT DETECTED Final   Yersinia enterocolitica NOT DETECTED NOT DETECTED Final    Vibrio NOT DETECTED NOT DETECTED Final   Enteropathogenic E coli NOT DETECTED NOT DETECTED Final   E coli (ETEC) LT/ST NOT DETECTED NOT DETECTED Final   E coli 3825 by PCR Not applicable NOT DETECTED Final   Cryptosporidium by PCR NOT DETECTED NOT DETECTED Final   Entamoeba histolytica NOT DETECTED NOT DETECTED Final  Adenovirus F 40/41 NOT DETECTED NOT DETECTED Final   Norovirus GI/GII NOT DETECTED NOT DETECTED Final   Sapovirus NOT DETECTED NOT DETECTED Final    Comment: (NOTE) Performed At: [facility] [place], [place] [id number] Rush Farmer MD HK:[id number]    Vibrio cholerae NOT DETECTED NOT DETECTED Final   Campylobacter by PCR NOT DETECTED NOT DETECTED Final   Salmonella by PCR NOT DETECTED NOT DETECTED Final   E coli (STEC) NOT DETECTED NOT DETECTED Final   Enteroaggregative E coli NOT DETECTED NOT DETECTED Final   Shigella by PCR NOT DETECTED NOT DETECTED Final   Cyclospora cayetanensis NOT DETECTED NOT DETECTED Final   Astrovirus NOT DETECTED NOT DETECTED Final   G lamblia by PCR NOT DETECTED NOT DETECTED Final   Rotavirus A by PCR NOT DETECTED NOT DETECTED Final  C difficile quick scan w PCR reflex     Status: None   Collection Time: 11/09/19 11:13 AM   Specimen: STOOL  Result Value Ref Range Status   C Diff antigen NEGATIVE NEGATIVE Final   C Diff toxin NEGATIVE NEGATIVE Final   C Diff interpretation No C. difficile detected.  Final    Comment: Performed at [facility], [place] [address] [place], [place] [address]      Studies: No results found.  Scheduled Meds: . budesonide  9 mg Oral Daily  . dicyclomine  10 mg Oral TID AC & HS  . loratadine  10 mg Oral Daily    Continuous Infusions:    LOS: 4 days     Kayleen Memos, MD [facility] Pager [phone]  If 7PM-7AM, please contact night-coverage [email or web address] Password [email or web address] 11/13/2019, 2:43 PM

## 2019-11-14 LAB — BASIC METABOLIC PANEL
Anion gap: 6 (ref 5–15)
BUN: 7 mg/dL (ref 6–20)
CO2: 22 mmol/L (ref 22–32)
Calcium: 8.9 mg/dL (ref 8.9–10.3)
Chloride: 109 mmol/L (ref 98–111)
Creatinine, Ser: 0.99 mg/dL (ref 0.44–1.00)
GFR calc Af Amer: >60 mL/min (ref >60)
GFR calc non Af Amer: >60 mL/min (ref >60)
Glucose, Bld: 121 mg/dL — ABNORMAL HIGH (ref 70–99)
Potassium: 4.1 mmol/L (ref 3.5–5.1)
Sodium: 137 mmol/L (ref 135–145)

## 2019-11-14 MED ORDER — TRAMADOL HCL 50 MG PO TABS
50.0000 mg | ORAL_TABLET | Freq: Two times a day (BID) | ORAL | Status: DC
  Administered 2019-11-14 – 2019-11-15 (×3): 50 mg via ORAL
  Filled 2019-11-14 (×3): qty 1

## 2019-11-14 MED ORDER — SODIUM CHLORIDE 0.9 % IV SOLN: INTRAVENOUS | Status: DC

## 2019-11-14 NOTE — Progress Notes (Signed)
Pt c/o midline leaking and uncomfortable. Flushes fine. IV team removed. Refuses new IV placement. Fluids ordered.On call notified.

## 2019-11-14 NOTE — Progress Notes (Signed)
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  Patient presented with abdominal pain and diarrhea.  Was seen at [facility].  Discharged home with oral Flagyl.  Came back for a follow-up and continued to have the same symptoms.  CT scan shows pancolitis.  Cdiff pcr and GI panel negative.  Colonoscopy 3/4 by Dr. Fuller Plan essentially normal however biopsy revealed lymphocytic colitis.  Started on directed therapies by G 3/5.  11/14/19: seen and examined.  Reports abd pain and cramping with persistent diarrhea.  Started Iv fluid hydration.  Pain management in place.   Assessment/Plan: Principal Problem:   Pancolitis (HCC) Active Problems:   Anemia   Generalized weakness   Diarrhea   Abnormal CT scan, colon  Newly diagnosed lymphocytic colitis  Presented with pancolitis on CT with persistent diarrhea. Post colonoscopy on 11/12/2019 by Dr. Fuller Plan colonoscopy essentially normal however pathology showed lymphocytic colitis. Appreciate [facility] assistance C. difficile PCR negative GI pathogen panel negative Recommendations per GI:  Begin Entocort 9 mg po qd Advance diet to lastose free. She is lactose intolerant  Continue dicyclomine 10 mg ac & hs  Add Imodium tid prn  Add Colestipol bid if diarrhea not controlled with Imodium OK for discharge when diarrhea better controlled, possibly later today or tomorrow  Outpatient GI follow up in 2-3 weeks   * IDA, stable. Repeat Feraheme in 1 week as outpatient. Outpatient follow up with PCP and GYN.  Reports abd pain and cramping with persistent diarrhea.  Started Iv fluid hydration. Started pain management with tramadol PRN.  Continue Bentyl and other GI meds.  Chronic microcytic anemia/ iron deficiency anemia from likely menorrhagia Evidence of significant iron deficiency on iron studies Received IV Feraheme 510 mg x1 on  3/3. Holding off p.o. iron supplement in the setting of acute GI illness Repeat Feraheme in 1 week as outpatient as recommended by GI.  Outpatient follow-up with PCP or GYN.  Resolved post repletion: Refractory hypokalemia secondary to GI losses from diarrhea Potassium 3.4>3.4>> 3.8> 4.1 Last magnesium 2.2 on 3/2  Obesity BMI 34 Recommend weight loss outpatient with regular physical activity and healthy dieting  Acute hemoglobin drop, stable Mild hemoglobin drop from 9.8 to 9.6>9.5 FOBT negative. Suspect dilutional in the setting of IV fluid hydration.     Diet:  Regular consistency diet.  DVT Prophylaxis:  SCD  Advance goals of care discussion: Full code  Family Communication:  Updated her husband at bedside on 3/6.  Disposition:  Pt is from home, anticipate discharge to home 3/7 once diarrhea is controlled.    Objective: Vitals:   11/13/19 0505 11/13/19 1417 11/13/19 2026 11/14/19 0513  BP: 134/86 130/84 132/79 127/89  Pulse: 78 98 86 82  Resp: 16 18 18 18   Temp: 98 F (36.7 C) 98.4 F (36.9 C) 97.9 F (36.6 C) 98 F (36.7 C)  TempSrc:  Oral Oral Oral  SpO2: 99% 99% 98% 99%  Weight:      Height:        Intake/Output Summary (Last 24 hours) at 11/14/2019 1342 Last data filed at 11/14/2019 0600 Gross per 24 hour  Intake 350 ml  Output --  Net 350 ml   Filed Weights   11/08/19 1317 11/10/19 2032  Weight: 86.2 kg 84.5 kg    Exam:  . General: 44 y.o. year-old female WD WN appears uncomfortable due to  abdominal cramps.  A&O x 3 . Cardiovascular: RRR no rubs or gallops . Respiratory: CTA no wheezes or rales . Abdomen: Soft NBS, mildly tender with palpation. . Musculoskeletal: No lower extremity edema.   [name] Psychiatry: Mood is appropriate for condition and setting.   Data Reviewed: CBC: Recent Labs  Lab 11/09/19 0543 11/10/19 0520 11/11/19 0533 11/12/19 0344 11/13/19 0612  WBC 6.5 6.4 6.2 6.1 6.9  HGB 9.4* 9.6* 9.8* 9.6* 9.5*  HCT 32.6*  32.4* 32.7* 31.6* 31.1*  MCV 74.1* 72.6* 71.4* 71.5* 72.3*  PLT 331 327 317 321 518   Basic Metabolic Panel: Recent Labs  Lab 11/10/19 0520 11/11/19 0533 11/12/19 0344 11/13/19 0612 11/14/19 0605  NA 137 137 136 139 137  K 3.5 3.4* 3.4* 3.8 4.1  CL 105 106 109 111 109  CO2 22 23 21* 22 22  GLUCOSE 99 93 90 92 121*  BUN <5* <5* <5* <5* 7  CREATININE 0.91 0.94 0.85 0.83 0.99  CALCIUM 9.0 8.9 8.8* 9.3 8.9  MG 2.2  --   --   --   --    GFR: Estimated Creatinine Clearance: 73.9 mL/min (by C-G formula based on SCr of 0.99 mg/dL). Liver Function Tests: Recent Labs  Lab 11/08/19 1333 11/09/19 0543  AST 19 20  ALT 12 14  ALKPHOS 99 85  BILITOT 0.3 0.2*  PROT 7.8 6.9  ALBUMIN 4.1 3.7   Recent Labs  Lab 11/08/19 1333  LIPASE 17   No results for input(s): AMMONIA in the last 168 hours. Coagulation Profile: No results for input(s): INR, PROTIME in the last 168 hours. Cardiac Enzymes: No results for input(s): CKTOTAL, CKMB, CKMBINDEX, TROPONINI in the last 168 hours. BNP (last 3 results) No results for input(s): PROBNP in the last 8760 hours. HbA1C: No results for input(s): HGBA1C in the last 72 hours. CBG: No results for input(s): GLUCAP in the last 168 hours. Lipid Profile: No results for input(s): CHOL, HDL, LDLCALC, TRIG, CHOLHDL, LDLDIRECT in the last 72 hours. Thyroid Function Tests: No results for input(s): TSH, T4TOTAL, FREET4, T3FREE, THYROIDAB in the last 72 hours. Anemia Panel: No results for input(s): VITAMINB12, FOLATE, FERRITIN, TIBC, IRON, RETICCTPCT in the last 72 hours. Urine analysis:    Component Value Date/Time   COLORURINE YELLOW 11/05/2019 1546   APPEARANCEUR CLEAR 11/05/2019 1546   LABSPEC >1.030 (H) 11/05/2019 1546   PHURINE 6.0 11/05/2019 1546   GLUCOSEU NEGATIVE 11/05/2019 1546   HGBUR NEGATIVE 11/05/2019 1546   BILIRUBINUR NEGATIVE 11/05/2019 1546   BILIRUBINUR neg 05/29/2013 1412   KETONESUR NEGATIVE 11/05/2019 1546   PROTEINUR  NEGATIVE 11/05/2019 1546   UROBILINOGEN 0.2 05/29/2013 1412   UROBILINOGEN 0.2 04/08/2012 2330   NITRITE NEGATIVE 11/05/2019 1546   LEUKOCYTESUR NEGATIVE 11/05/2019 1546   Sepsis Labs: @LABRCNTIP (procalcitonin:4,lacticidven:4)  ) Recent Results (from the past 240 hour(s))  GI pathogen panel by PCR, stool     Status: None   Collection Time: 11/06/19  8:30 AM   Specimen: Stool  Result Value Ref Range Status   Plesiomonas shigelloides NOT PERFORMED  Final    Comment: Test not performed   Yersinia enterocolitica NOT PERFORMED  Final    Comment: Test not performed   Vibrio NOT PERFORMED  Final    Comment: Test not performed   Enteropathogenic E coli NOT PERFORMED  Final    Comment: Test not performed   E coli (ETEC) LT/ST NOT PERFORMED  Final    Comment: Test not performed   E coli  0157 by PCR NOT PERFORMED  Final    Comment: Test not performed   Cryptosporidium by PCR NOT PERFORMED  Final    Comment: Test not performed   Entamoeba histolytica NOT PERFORMED  Final    Comment: Test not performed   Adenovirus F 40/41 NOT PERFORMED  Final    Comment: Test not performed   Norovirus GI/GII NOT PERFORMED  Final    Comment: Test not performed   Sapovirus NOT PERFORMED  Final    Comment: (NOTE) Test not performed Performed At: [facility] [place], [place] [id number] Rush Farmer MD ZH:[id number]    Vibrio cholerae NOT PERFORMED  Final    Comment: Test not performed   Campylobacter by PCR EXB2  Final    Comment: (NOTE) The specimen submitted does not meet the laboratory's criteria for acceptability. Refer to [employer] [facility] Services for specimen acceptability criteria. Notified Artist Pais 11/07/2019-[place] 1. Received a REF Stool 2. Need an Orange P/P    Salmonella by PCR NOT PERFORMED  Final    Comment: Test not performed   E coli (STEC) NOT PERFORMED  Final    Comment: Test not performed   Enteroaggregative E coli NOT PERFORMED   Final    Comment: Test not performed   Shigella by PCR NOT PERFORMED  Final    Comment: Test not performed   Cyclospora cayetanensis NOT PERFORMED  Final    Comment: Test not performed   Astrovirus NOT PERFORMED  Final    Comment: Test not performed   G lamblia by PCR NOT PERFORMED  Final    Comment: Test not performed   Rotavirus A by PCR NOT PERFORMED  Final    Comment: Test not performed  SARS CORONAVIRUS 2 (TAT 6-24 HRS) Nasopharyngeal Nasopharyngeal Swab     Status: None   Collection Time: 11/08/19  6:10 PM   Specimen: Nasopharyngeal Swab  Result Value Ref Range Status   SARS Coronavirus 2 NEGATIVE NEGATIVE Final    Comment: (NOTE) SARS-CoV-2 target nucleic acids are NOT DETECTED. The SARS-CoV-2 RNA is generally detectable in upper and lower respiratory specimens during the acute phase of infection. Negative results do not preclude SARS-CoV-2 infection, do not rule out co-infections with other pathogens, and should not be used as the sole basis for treatment or other patient management decisions. Negative results must be combined with clinical observations, patient history, and epidemiological information. The expected result is Negative. Fact Sheet for Patients: SugarRoll.be Fact Sheet for Healthcare Providers: https://www.woods-mathews.com/ This test is not yet approved or cleared by the [place] FDA and  has been authorized for detection and/or diagnosis of SARS-CoV-2 by FDA under an Emergency Use Authorization (EUA). This EUA will remain  in effect (meaning this test can be used) for the duration of the COVID-19 declaration under Section 56 4(b)(1) of the Act, 21 U.S.C. section 360bbb-3(b)(1), unless the authorization is terminated or revoked sooner. Performed at [facility], [place] [address], [place], [place] [address]   GI pathogen panel by PCR, stool     Status: None   Collection Time: 11/09/19 11:13 AM    Specimen: Stool  Result Value Ref Range Status   Plesiomonas shigelloides NOT DETECTED NOT DETECTED Final   Yersinia enterocolitica NOT DETECTED NOT DETECTED Final   Vibrio NOT DETECTED NOT DETECTED Final   Enteropathogenic E coli NOT DETECTED NOT DETECTED Final   E coli (ETEC) LT/ST NOT DETECTED NOT DETECTED Final   E coli 0157  by PCR Not applicable NOT DETECTED Final   Cryptosporidium by PCR NOT DETECTED NOT DETECTED Final   Entamoeba histolytica NOT DETECTED NOT DETECTED Final   Adenovirus F 40/41 NOT DETECTED NOT DETECTED Final   Norovirus GI/GII NOT DETECTED NOT DETECTED Final   Sapovirus NOT DETECTED NOT DETECTED Final    Comment: (NOTE) Performed At: [facility] [place], [place] [id number] Rush Farmer MD [id number]    Vibrio cholerae NOT DETECTED NOT DETECTED Final   Campylobacter by PCR NOT DETECTED NOT DETECTED Final   Salmonella by PCR NOT DETECTED NOT DETECTED Final   E coli (STEC) NOT DETECTED NOT DETECTED Final   Enteroaggregative E coli NOT DETECTED NOT DETECTED Final   Shigella by PCR NOT DETECTED NOT DETECTED Final   Cyclospora cayetanensis NOT DETECTED NOT DETECTED Final   Astrovirus NOT DETECTED NOT DETECTED Final   G lamblia by PCR NOT DETECTED NOT DETECTED Final   Rotavirus A by PCR NOT DETECTED NOT DETECTED Final  C difficile quick scan w PCR reflex     Status: None   Collection Time: 11/09/19 11:13 AM   Specimen: STOOL  Result Value Ref Range Status   C Diff antigen NEGATIVE NEGATIVE Final   C Diff toxin NEGATIVE NEGATIVE Final   C Diff interpretation No C. difficile detected.  Final    Comment: Performed at [facility], [place] [address] [place], [place] [address]      Studies: No results found.  Scheduled Meds: . budesonide  9 mg Oral Daily  . dicyclomine  10 mg Oral TID AC & HS  . loratadine  10 mg Oral Daily  . traMADol  50 mg Oral Q12H    Continuous Infusions: . sodium chloride 50 mL/hr at  11/14/19 1046     LOS: 5 days     Kayleen Memos, MD [facility] Pager [phone]  If 7PM-7AM, please contact night-coverage [email or web address] Password [email or web address] 11/14/2019, 1:42 PM

## 2019-11-15 MED ORDER — LOPERAMIDE HCL 2 MG PO CAPS: 2.0000 mg | ORAL_CAPSULE | Freq: Three times a day (TID) | ORAL | 0 refills | Status: DC | PRN

## 2019-11-15 MED ORDER — BUDESONIDE 3 MG PO CPEP: 9.0000 mg | ORAL_CAPSULE | Freq: Every day | ORAL | 0 refills | Status: AC

## 2019-11-15 MED ORDER — DICYCLOMINE HCL 10 MG PO CAPS: 10.0000 mg | ORAL_CAPSULE | Freq: Three times a day (TID) | ORAL | 0 refills | Status: DC

## 2019-11-15 MED ORDER — TRAMADOL HCL 50 MG PO TABS: 50.0000 mg | ORAL_TABLET | Freq: Two times a day (BID) | ORAL | 0 refills | Status: AC | PRN

## 2019-11-15 NOTE — Discharge Summary (Signed)
Discharge Summary  Erika David HLK:[id number] DOB: Dec 16, 1975  PCP: Martyn Malay, MD  Admit date: 11/08/2019 Discharge date: 11/15/2019  Time spent: 35 minutes   Recommendations for Outpatient Follow-up:  1. Follow up with GI in 1 week. 2. Follow up with PCP. 3. Please take your medications as prescribed.    Discharge Diagnoses:  Active Hospital Problems   Diagnosis Date Noted  . Pancolitis ([place]) 11/08/2019  . Diarrhea   . Abnormal CT scan, colon   . Generalized weakness 11/09/2019  . Anemia 01/04/2012    Resolved Hospital Problems  No resolved problems to display.    Discharge Condition: Stable.   Diet recommendation: Resume previous diet.   Vitals:   11/14/19 2018 11/15/19 0525  BP: 121/79 115/71  Pulse: 76 93  Resp: 13 16  Temp: 98.2 F (36.8 C) 98.3 F (36.8 C)  SpO2: 98% 100%    History of present illness:  Chronic anemia from menorrhagia. Patient presented with abdominal pain and diarrhea. Was seen at [facility]. Discharged home with oral Flagyl.  Came back for a follow-up and continued to have the same symptoms.  CT scan 11/05/19 shows pancolitis.  Cdiff pcr and GI panel negative.  Colonoscopy 3/4 by Dr. Fuller Plan essentially normal however biopsy revealed lymphocytic colitis.  Started on directed therapies by GI on 11/13/19.  11/15/19:  Seen and examined.  No acute events overnight.  No diarrhea.  No new complaints.   Hospital Course:  Principal Problem:   Pancolitis [facility]) Active Problems:   Anemia   Generalized weakness   Diarrhea   Abnormal CT scan, colon  Newly diagnosed lymphocytic colitis  Presented with with persistent diarrhea pancolitis on CT scan abdomen and pelvis with contrast done on 11/05/2019.  Post colonoscopy on 11/12/2019 by Dr. Fuller Plan colonoscopy essentially normal however biopsy showed lymphocytic colitis. Appreciate [facility] assistance C. difficile PCR negative GI pathogen panel negative Recommendations per GI:  Begin  Entocort 9 mg po qd Advance diet to lastose free. She is lactose intolerant  Continue dicyclomine 10 mg ac &hs  Add Imodium tid prn  Add Colestipol bid if diarrhea not controlled with Imodium OK for discharge when diarrhea better controlled Outpatient GI follow up in 2-3 weeks  *IDA, stable. Repeat Feraheme in 1 week as outpatient. Outpatient follow up with PCP and GYN. Diarrhea has resolved on 11/15/2019. Follow-up with GI and PCP.  Follow-up with gynecology as well if you have 1.  Chronic microcytic anemia/ iron deficiency anemia from likely menorrhagia Evidence of significant iron deficiency on iron studies Received IV Feraheme 510 mg x1 on 3/3. Holding off p.o. iron supplement in the setting of acute GI illness Patient declines p.o. ferrous sulfate due to risk of constipation. Repeat Feraheme in 1 week as outpatient as recommended by GI.  Outpatient follow-up with PCP or GYN.  Resolved post repletion: Refractory hypokalemia secondary to GI losses from diarrhea Potassium 3.4>3.4>> 3.8> 4.1 Last magnesium 2.2 on 3/2  Obesity BMI 34 Recommend weight loss outpatient with regular physical activity and healthy dieting  Acute hemoglobin drop, stable Mild hemoglobin drop from 9.8 to 9.6>9.5 FOBT negative. Suspect dilutional in the setting of IV fluid hydration.     Advance goals of care discussion:Full code     Procedures:  Colonoscopy on 11/12/2019  Consultations:  GI  Discharge Exam: BP 115/71 (BP Location: Right Arm)   Pulse 93   Temp 98.3 F (36.8 C) (Oral)   Resp 16   Ht 5'  1.5" (1.562 m)   Wt 84.5 kg   LMP 10/12/2019 Comment: - upreg 11/05/19, tubal ligation  SpO2 100%   BMI 34.63 kg/m  . General: 44 y.o. year-old female well developed well nourished in no acute distress.  Alert and oriented x3. . Cardiovascular: Regular rate and rhythm with no rubs or gallops.  No thyromegaly or JVD noted.   [name] Respiratory: Clear to auscultation with no  wheezes or rales. Good inspiratory effort. . Abdomen: Soft nontender nondistended with normal bowel sounds x4 quadrants. . Musculoskeletal: No lower extremity edema. 2/4 pulses in all 4 extremities. [name] Psychiatry: Mood is appropriate for condition and setting  Discharge Instructions You were cared for by a hospitalist during your hospital stay. If you have any questions about your discharge medications or the care you received while you were in the hospital after you are discharged, you can call the unit and asked to speak with the hospitalist on call if the hospitalist that took care of you is not available. Once you are discharged, your primary care physician will handle any further medical issues. Please note that NO REFILLS for any discharge medications will be authorized once you are discharged, as it is imperative that you return to your primary care physician (or establish a relationship with a primary care physician if you do not have one) for your aftercare needs so that they can reassess your need for medications and monitor your lab values.  Discharge Instructions    MyChart COVID-19 home monitoring program   Complete by: Nov 15, 2019    Is the patient willing to use the [facility] for home monitoring?: No     Allergies as of 11/15/2019      Reactions   Dust Mite Extract Anaphylaxis   Kiwi Extract Itching, Swelling   Pollen Extract Anaphylaxis   Tree pollen in environment      Medication List    STOP taking these medications   azithromycin 250 MG tablet Commonly known as: ZITHROMAX   fluconazole 150 MG tablet Commonly known as: DIFLUCAN   HYDROcodone-acetaminophen 5-325 MG tablet Commonly known as: Norco   HYDROcodone-homatropine 5-1.5 MG/5ML syrup Commonly known as: Hydromet   metroNIDAZOLE 500 MG tablet Commonly known as: Flagyl   ondansetron 4 MG disintegrating tablet Commonly known as: Zofran ODT   potassium chloride SA 20 MEQ tablet Commonly known as:  KLOR-CON     TAKE these medications   budesonide 3 MG 24 hr capsule Commonly known as: ENTOCORT EC Take 3 capsules (9 mg total) by mouth daily. Start taking on: November 16, 2019   cetirizine 10 MG tablet Commonly known as: ZYRTEC Take 10 mg by mouth daily.   dicyclomine 10 MG capsule Commonly known as: BENTYL Take 1 capsule (10 mg total) by mouth 4 (four) times daily -  before meals and at bedtime.   loperamide 2 MG capsule Commonly known as: IMODIUM Take 1 capsule (2 mg total) by mouth 3 (three) times daily between meals as needed for diarrhea or loose stools.   traMADol 50 MG tablet Commonly known as: ULTRAM Take 1 tablet (50 mg total) by mouth every 12 (twelve) hours as needed for up to 5 days for moderate pain or severe pain.      Allergies  Allergen Reactions  . Dust Mite Extract Anaphylaxis  . Kiwi Extract Itching and Swelling  . Pollen Extract Anaphylaxis    Tree pollen in environment   Follow-up Information    Dorris Singh M,  MD. Call in 1 day(s).   Specialty: Family Medicine Why: please call for a post hospital follow up appointment  Contact information: [place] [address] [phone]        Ladene Artist, MD. Call in 1 day(s).   Specialty: Gastroenterology Why: please call for a post hospital follow up appointment Contact information: [address] [place] [address] [phone]            The results of significant diagnostics from this hospitalization (including imaging, microbiology, ancillary and laboratory) are listed below for reference.    Significant Diagnostic Studies: CT ABDOMEN PELVIS W CONTRAST  Result Date: 11/05/2019 CLINICAL DATA:  Left lower quadrant abdominal pain and diarrhea. EXAM: CT ABDOMEN AND PELVIS WITH CONTRAST TECHNIQUE: Multidetector CT imaging of the abdomen and pelvis was performed using the standard protocol following bolus administration of intravenous contrast. CONTRAST:  163m  OMNIPAQUE IOHEXOL 300 MG/ML  SOLN COMPARISON:  CT scan from 2017 FINDINGS: Lower chest: The lung bases are clear of an acute process. Minimal dependent atelectasis. The heart is normal in size. No pericardial effusion. Hepatobiliary: No focal hepatic lesions or intrahepatic biliary dilatation. The gallbladder is normal. No common bile duct dilatation. The portal and hepatic veins are patent. Pancreas: No mass, inflammation or ductal dilatation. Spleen: Normal size. No focal lesions. Adrenals/Urinary Tract: The adrenal glands and kidneys are normal. The bladder is normal. Stomach/Bowel: The stomach, duodenum, small bowel and terminal ileum are normal. The appendix is normal. There is a diffuse and fairly marked inflammatory or infectious colitis involving the cecum, the entire ascending colon, transverse colon and descending colon. I do not see any obvious involvement of the rectum or sigmoid colon. No CT findings for diverticulitis. Vascular/Lymphatic: The aorta and branch vessels are patent. No atherosclerotic calcifications. The major venous structures are patent. Small scattered retroperitoneal and mesenteric lymph nodes but no mass or overt adenopathy. Reproductive: The uterus is unremarkable. Both ovaries are normal except for small right renal cysts. Other: No pelvic adenopathy or free pelvic fluid collections. No inguinal mass or adenopathy. Musculoskeletal: No significant bony findings. IMPRESSION: 1. Diffuse and fairly marked inflammatory or infectious colitis. 2. No other significant abdominal/pelvic findings. Electronically Signed   By: PMarijo SanesM.D.   On: 11/05/2019 17:12   DG Chest Portable 1 View  Result Date: 10/19/2019 CLINICAL DATA:  Cough and recent COVID exposure EXAM: PORTABLE CHEST 1 VIEW COMPARISON:  06/18/2019 FINDINGS: Cardiac shadow is stable. The lungs are well aerated bilaterally. No focal infiltrate or sizable effusion is seen. No bony abnormality is noted. IMPRESSION: No acute  abnormality seen. Electronically Signed   By: MInez CatalinaM.D.   On: 10/19/2019 09:57   DG ABD ACUTE 2+V W 1V CHEST  Result Date: 11/09/2019 CLINICAL DATA:  Umbilical abdominal pain and diarrhea for 4 days. EXAM: DG ABDOMEN ACUTE W/ 1V CHEST COMPARISON:  11/05/2019 FINDINGS: There is no evidence of dilated bowel loops or free intraperitoneal air. No radiopaque calculi or other significant radiographic abnormality is seen. Heart size and mediastinal contours are within normal limits. Both lungs are clear. IMPRESSION: Negative abdominal radiographs.  No acute cardiopulmonary disease. Electronically Signed   By: TKerby MoorsM.D.   On: 11/09/2019 09:44    Microbiology: Recent Results (from the past 240 hour(s))  GI pathogen panel by PCR, stool     Status: None   Collection Time: 11/06/19  8:30 AM   Specimen: Stool  Result Value Ref Range Status  Plesiomonas shigelloides NOT PERFORMED  Final    Comment: Test not performed   Yersinia enterocolitica NOT PERFORMED  Final    Comment: Test not performed   Vibrio NOT PERFORMED  Final    Comment: Test not performed   Enteropathogenic E coli NOT PERFORMED  Final    Comment: Test not performed   E coli (ETEC) LT/ST NOT PERFORMED  Final    Comment: Test not performed   E coli 0157 by PCR NOT PERFORMED  Final    Comment: Test not performed   Cryptosporidium by PCR NOT PERFORMED  Final    Comment: Test not performed   Entamoeba histolytica NOT PERFORMED  Final    Comment: Test not performed   Adenovirus F 40/41 NOT PERFORMED  Final    Comment: Test not performed   Norovirus GI/GII NOT PERFORMED  Final    Comment: Test not performed   Sapovirus NOT PERFORMED  Final    Comment: (NOTE) Test not performed Performed At: [facility] [place], [place] [id number] Rush Farmer MD UK:[id number]    Vibrio cholerae NOT PERFORMED  Final    Comment: Test not performed   Campylobacter by PCR JSE8  Final    Comment: (NOTE) The  specimen submitted does not meet the laboratory's criteria for acceptability. Refer to [employer] of Services for specimen acceptability criteria. Notified Artist Pais 11/07/2019-[place] 1. Received a REF Stool 2. Need an Orange P/P    Salmonella by PCR NOT PERFORMED  Final    Comment: Test not performed   E coli (STEC) NOT PERFORMED  Final    Comment: Test not performed   Enteroaggregative E coli NOT PERFORMED  Final    Comment: Test not performed   Shigella by PCR NOT PERFORMED  Final    Comment: Test not performed   Cyclospora cayetanensis NOT PERFORMED  Final    Comment: Test not performed   Astrovirus NOT PERFORMED  Final    Comment: Test not performed   G lamblia by PCR NOT PERFORMED  Final    Comment: Test not performed   Rotavirus A by PCR NOT PERFORMED  Final    Comment: Test not performed  SARS CORONAVIRUS 2 (TAT 6-24 HRS) Nasopharyngeal Nasopharyngeal Swab     Status: None   Collection Time: 11/08/19  6:10 PM   Specimen: Nasopharyngeal Swab  Result Value Ref Range Status   SARS Coronavirus 2 NEGATIVE NEGATIVE Final    Comment: (NOTE) SARS-CoV-2 target nucleic acids are NOT DETECTED. The SARS-CoV-2 RNA is generally detectable in upper and lower respiratory specimens during the acute phase of infection. Negative results do not preclude SARS-CoV-2 infection, do not rule out co-infections with other pathogens, and should not be used as the sole basis for treatment or other patient management decisions. Negative results must be combined with clinical observations, patient history, and epidemiological information. The expected result is Negative. Fact Sheet for Patients: SugarRoll.be Fact Sheet for Healthcare Providers: https://www.woods-mathews.com/ This test is not yet approved or cleared by the [place] FDA and  has been authorized for detection and/or diagnosis of SARS-CoV-2 by FDA under an Emergency Use  Authorization (EUA). This EUA will remain  in effect (meaning this test can be used) for the duration of the COVID-19 declaration under Section 56 4(b)(1) of the Act, 21 U.S.C. section 360bbb-3(b)(1), unless the authorization is terminated or revoked sooner. Performed at [facility], [place] [address], [place], [place] [address]   GI pathogen  panel by PCR, stool     Status: None   Collection Time: 11/09/19 11:13 AM   Specimen: Stool  Result Value Ref Range Status   Plesiomonas shigelloides NOT DETECTED NOT DETECTED Final   Yersinia enterocolitica NOT DETECTED NOT DETECTED Final   Vibrio NOT DETECTED NOT DETECTED Final   Enteropathogenic E coli NOT DETECTED NOT DETECTED Final   E coli (ETEC) LT/ST NOT DETECTED NOT DETECTED Final   E coli 1749 by PCR Not applicable NOT DETECTED Final   Cryptosporidium by PCR NOT DETECTED NOT DETECTED Final   Entamoeba histolytica NOT DETECTED NOT DETECTED Final   Adenovirus F 40/41 NOT DETECTED NOT DETECTED Final   Norovirus GI/GII NOT DETECTED NOT DETECTED Final   Sapovirus NOT DETECTED NOT DETECTED Final    Comment: (NOTE) Performed At: [facility] [place], [place] [id number] Rush Farmer MD BW:[id number]    Vibrio cholerae NOT DETECTED NOT DETECTED Final   Campylobacter by PCR NOT DETECTED NOT DETECTED Final   Salmonella by PCR NOT DETECTED NOT DETECTED Final   E coli (STEC) NOT DETECTED NOT DETECTED Final   Enteroaggregative E coli NOT DETECTED NOT DETECTED Final   Shigella by PCR NOT DETECTED NOT DETECTED Final   Cyclospora cayetanensis NOT DETECTED NOT DETECTED Final   Astrovirus NOT DETECTED NOT DETECTED Final   G lamblia by PCR NOT DETECTED NOT DETECTED Final   Rotavirus A by PCR NOT DETECTED NOT DETECTED Final  C difficile quick scan w PCR reflex     Status: None   Collection Time: 11/09/19 11:13 AM   Specimen: STOOL  Result Value Ref Range Status   C Diff antigen NEGATIVE NEGATIVE Final   C Diff toxin  NEGATIVE NEGATIVE Final   C Diff interpretation No C. difficile detected.  Final    Comment: Performed at [facility], [place] [address], [place], [place] [address]     Labs: Basic Metabolic Panel: Recent Labs  Lab 11/10/19 0520 11/11/19 0533 11/12/19 0344 11/13/19 0612 11/14/19 0605  NA 137 137 136 139 137  K 3.5 3.4* 3.4* 3.8 4.1  CL 105 106 109 111 109  CO2 22 23 21* 22 22  GLUCOSE 99 93 90 92 121*  BUN <5* <5* <5* <5* 7  CREATININE 0.91 0.94 0.85 0.83 0.99  CALCIUM 9.0 8.9 8.8* 9.3 8.9  MG 2.2  --   --   --   --    Liver Function Tests: Recent Labs  Lab 11/09/19 0543  AST 20  ALT 14  ALKPHOS 85  BILITOT 0.2*  PROT 6.9  ALBUMIN 3.7   No results for input(s): LIPASE, AMYLASE in the last 168 hours. No results for input(s): AMMONIA in the last 168 hours. CBC: Recent Labs  Lab 11/09/19 0543 11/10/19 0520 11/11/19 0533 11/12/19 0344 11/13/19 0612  WBC 6.5 6.4 6.2 6.1 6.9  HGB 9.4* 9.6* 9.8* 9.6* 9.5*  HCT 32.6* 32.4* 32.7* 31.6* 31.1*  MCV 74.1* 72.6* 71.4* 71.5* 72.3*  PLT 331 327 317 321 298   Cardiac Enzymes: No results for input(s): CKTOTAL, CKMB, CKMBINDEX, TROPONINI in the last 168 hours. BNP: BNP (last 3 results) No results for input(s): BNP in the last 8760 hours.  ProBNP (last 3 results) No results for input(s): PROBNP in the last 8760 hours.  CBG: No results for input(s): GLUCAP in the last 168 hours.     Signed:  Kayleen Memos, MD [facility] 11/15/2019, 1:33 PM

## 2019-11-15 NOTE — TOC Progression Note (Signed)
Transition of Care [facility]) - Progression Note    Patient Details  Name: Erika David MRN: [id number] Date of Birth: [birth year]  Transition of Care [facility]) CM/SW Contact  Joaquin Courts, RN Phone Number: 11/15/2019, 10:56 AM  Clinical Narrative:   CM noted TOC consult for pcp needs.  CM unable to schedule pcp appointments on weekend due to offices being closed.  Patient to reach out to insurance provider for a complete list of in-network providers.      Expected Discharge Plan: Home/Self Care Barriers to Discharge: No Barriers Identified  Expected Discharge Plan and Services Expected Discharge Plan: Home/Self Care   Discharge Planning Services: CM Consult     Expected Discharge Date: 11/15/19                                     Social Determinants of Health (SDOH) Interventions    Readmission Risk Interventions No flowsheet data found.

## 2019-11-16 ENCOUNTER — Encounter: Payer: Self-pay | Admitting: Nurse Practitioner

## 2019-11-18 ENCOUNTER — Ambulatory Visit (INDEPENDENT_AMBULATORY_CARE_PROVIDER_SITE_OTHER): Payer: BC Managed Care – PPO | Admitting: Nurse Practitioner

## 2019-11-18 ENCOUNTER — Encounter: Payer: Self-pay | Admitting: Nurse Practitioner

## 2019-11-18 ENCOUNTER — Other Ambulatory Visit: Payer: Self-pay

## 2019-11-18 VITALS — BP 122/82 | HR 102 | Temp 96.9°F | Ht 61.0 in | Wt 187.8 lb

## 2019-11-18 DIAGNOSIS — N920 Excessive and frequent menstruation with regular cycle: Secondary | ICD-10-CM | POA: Diagnosis not present

## 2019-11-18 DIAGNOSIS — D500 Iron deficiency anemia secondary to blood loss (chronic): Secondary | ICD-10-CM

## 2019-11-18 DIAGNOSIS — K52832 Lymphocytic colitis: Secondary | ICD-10-CM

## 2019-11-18 LAB — TSH: TSH: 0.71 u[IU]/mL (ref 0.35–4.50)

## 2019-11-18 LAB — TIQ- MISLABELED: Test Ordered On Req: 5616

## 2019-11-18 NOTE — Progress Notes (Signed)
Subjective:  Patient ID: Erika David, female    DOB: [birth year]  Age: 44 y.o. MRN: [id number]  CC: Hospitalization Follow-up (Colitis--Pt stated shes doing better but getting use to medication//pt states no more diarrhea//Pt asking about FMLA  if future flare up//iron infusion questions?)  HPI  Erika David is here to establish care, hospital f/up and eval on anemia. She was hospitalized for acute colitis 02/28-03/07. She under went diagnostic colonoscopy and diagnosed with pancolitis. Diarrhea resolved prior to hospital discharge. This is her first episode of colitis. This was possibly due to excessive NSAIDs use prior to hospitalization. She states she had multiple dental procedure which required use of ibuprofen for pain management. During hospitalization she was also found to have iron deficient anemia due to menorrhagia. Iron infusion was administered on 3/3. She was advised to repeat feraheme in 1week. She has upcoming appt with GI on 11/23/2019. Today she denies any acute symptoms. She is concerned symptoms can re occur and will like for intermittent FMLA to be completed. I reviewed lab results, procedure notes and radiology reports from hospital.  Reviewed past Medical, Social and Family history today.  Outpatient Medications Prior to Visit  Medication Sig Dispense Refill  . budesonide (ENTOCORT EC) 3 MG 24 hr capsule Take 3 capsules (9 mg total) by mouth daily. 90 capsule 0  . cetirizine (ZYRTEC) 10 MG tablet Take 10 mg by mouth daily.    [name] dicyclomine (BENTYL) 10 MG capsule Take 1 capsule (10 mg total) by mouth 4 (four) times daily -  before meals and at bedtime. 120 capsule 0  . loperamide (IMODIUM) 2 MG capsule Take 1 capsule (2 mg total) by mouth 3 (three) times daily between meals as needed for diarrhea or loose stools. 30 capsule 0  . traMADol (ULTRAM) 50 MG tablet Take 1 tablet (50 mg total) by mouth every 12 (twelve) hours as needed for up to 5 days for moderate pain or severe  pain. 10 tablet 0   No facility-administered medications prior to visit.    ROS See HPI  Objective:  BP 122/82   Pulse (!) 102   Temp (!) 96.9 F (36.1 C) (Tympanic)   Ht 5' 1"  (1.549 m)   Wt 187 lb 12.8 oz (85.2 kg)   SpO2 98%   BMI 35.48 kg/m   BP Readings from Last 3 Encounters:  11/23/19 127/64  11/18/19 122/82  11/15/19 115/71    Wt Readings from Last 3 Encounters:  11/23/19 187 lb (84.8 kg)  11/18/19 187 lb 12.8 oz (85.2 kg)  11/10/19 186 lb 4.6 oz (84.5 kg)    Physical Exam Vitals reviewed.  Cardiovascular:     Rate and Rhythm: Normal rate and regular rhythm.     Pulses: Normal pulses.     Heart sounds: Normal heart sounds.  Pulmonary:     Effort: Pulmonary effort is normal.     Breath sounds: Normal breath sounds.  Abdominal:     General: Bowel sounds are normal. There is no distension.     Palpations: Abdomen is soft.     Tenderness: There is no abdominal tenderness.  Musculoskeletal:     Cervical back: Normal range of motion and neck supple.  Skin:    General: Skin is warm and dry.     Coloration: Skin is not pale.  Neurological:     Mental Status: She is alert and oriented to person, place, and time.  Psychiatric:        Mood  and Affect: Mood normal.        Behavior: Behavior normal.        Thought Content: Thought content normal.    Lab Results  Component Value Date   WBC 6.9 11/13/2019   HGB 9.5 (L) 11/13/2019   HCT 31.1 (L) 11/13/2019   PLT 298 11/13/2019   GLUCOSE 121 (H) 11/14/2019   LDLDIRECT 126 (H) 05/29/2013   ALT 14 11/09/2019   AST 20 11/09/2019   NA 137 11/14/2019   K 4.1 11/14/2019   CL 109 11/14/2019   CREATININE 0.99 11/14/2019   BUN 7 11/14/2019   CO2 22 11/14/2019   TSH 0.71 11/18/2019    DG ABD ACUTE 2+V W 1V CHEST  Result Date: 11/09/2019 CLINICAL DATA:  Umbilical abdominal pain and diarrhea for 4 days. EXAM: DG ABDOMEN ACUTE W/ 1V CHEST COMPARISON:  11/05/2019 FINDINGS: There is no evidence of dilated bowel  loops or free intraperitoneal air. No radiopaque calculi or other significant radiographic abnormality is seen. Heart size and mediastinal contours are within normal limits. Both lungs are clear. IMPRESSION: Negative abdominal radiographs.  No acute cardiopulmonary disease. Electronically Signed   By: Kerby Moors M.D.   On: 11/09/2019 09:44    Assessment & Plan:  This visit occurred during the SARS-CoV-2 public health emergency.  Safety protocols were in place, including screening questions prior to the visit, additional usage of staff PPE, and extensive cleaning of exam room while observing appropriate contact time as indicated for disinfecting solutions.   Erika David was seen today for hospitalization follow-up.  Diagnoses and all orders for this visit:  Iron deficiency anemia due to chronic blood loss -     Iron, TIBC and Ferritin Panel -     TSH -     Ambulatory referral to Hematology / Oncology  Menorrhagia with regular cycle -     Iron, TIBC and Ferritin Panel -     TSH -     Ambulatory referral to Hematology / Oncology  Lymphocytic colitis -     Ambulatory referral to Hematology / Oncology  Other orders -     [place] -     PAT ID [id number] DOC   I am having Erika David maintain her cetirizine, loperamide, dicyclomine, and budesonide.  No orders of the defined types were placed in this encounter.   Problem List Items Addressed This Visit      Digestive   Lymphocytic colitis   Relevant Orders   Ambulatory referral to Hematology / Oncology     Other   Anemia - Primary    Due to menorrhagia Negative iFOB and no acute GI bleed per colonoscopy. IV iron administered 11/11/19  Repeat iron panel today Hold oral iron due to acute pancolitis May need to repeat Feraheme infusion?      Relevant Orders   Iron, TIBC and Ferritin Panel (Completed)   TSH (Completed)   Ambulatory referral to Hematology / Oncology    Other Visit Diagnoses    Menorrhagia with regular  cycle       Relevant Orders   Iron, TIBC and Ferritin Panel (Completed)   TSH (Completed)   Ambulatory referral to Hematology / Oncology      Follow-up: Return if symptoms worsen or fail to improve.  Wilfred Lacy, NP

## 2019-11-18 NOTE — Patient Instructions (Addendum)
Avoid all ASA and NSAIDs Maintain appt with GI Inquire about possible correlation between angioedema and colitis?  Improved iron panel. Maintain iron rich diet. Discuss when to resume oral iron supplement with GI. Iron deficiency is due to menorrhagia. Schedule appt to discuss contraception with GYN.  Per GI, she to hold oral iron and take another iron infusion. Entered referral to hematology

## 2019-11-20 ENCOUNTER — Telehealth: Payer: Self-pay

## 2019-11-20 ENCOUNTER — Encounter: Payer: Self-pay | Admitting: Nurse Practitioner

## 2019-11-20 NOTE — Assessment & Plan Note (Signed)
Due to menorrhagia Negative iFOB and no acute GI bleed per colonoscopy. IV iron administered 11/11/19  Repeat iron panel today Hold oral iron due to acute pancolitis May need to repeat Feraheme infusion?

## 2019-11-20 NOTE — Telephone Encounter (Signed)
I called [facility] about the test ordered on 11/18/19 for 5616 for Iron, TIBC, Ferritin. Shay informed me the test result was pending and should be available tonight or no later than Monday.

## 2019-11-20 NOTE — Assessment & Plan Note (Signed)
>>  ASSESSMENT AND PLAN FOR ANEMIA WRITTEN ON 11/20/2019  1:10 PM BY Anaira Seay LUM, NP  Due to menorrhagia Negative iFOB and no acute GI bleed per colonoscopy. IV iron administered 11/11/19  Repeat iron panel today Hold oral iron due to acute pancolitis May need to repeat Feraheme infusion?

## 2019-11-20 NOTE — Telephone Encounter (Signed)
Ok, thank you

## 2019-11-21 LAB — IRON,TIBC AND FERRITIN PANEL
%SAT: 12 % (calc) — ABNORMAL LOW (ref 16–45)
Ferritin: 22 ng/mL (ref 16–232)
Iron: 45 ug/dL (ref 40–190)
TIBC: 362 mcg/dL (calc) (ref 250–450)

## 2019-11-21 LAB — PAT ID TIQ DOC: Test Affected: 5616

## 2019-11-23 ENCOUNTER — Ambulatory Visit (INDEPENDENT_AMBULATORY_CARE_PROVIDER_SITE_OTHER): Payer: BC Managed Care – PPO | Admitting: Nurse Practitioner

## 2019-11-23 ENCOUNTER — Encounter: Payer: Self-pay | Admitting: General Surgery

## 2019-11-23 ENCOUNTER — Encounter: Payer: Self-pay | Admitting: Nurse Practitioner

## 2019-11-23 VITALS — BP 127/64 | HR 73 | Temp 97.8°F | Ht 61.0 in | Wt 187.0 lb

## 2019-11-23 DIAGNOSIS — K52832 Lymphocytic colitis: Secondary | ICD-10-CM

## 2019-11-23 NOTE — Patient Instructions (Addendum)
.  If you are age 42 or older, your body mass index should be between 23-30. Your Body mass index is 35.33 kg/m. If this is out of the aforementioned range listed, please consider follow up with your Primary Care Provider.  If you are age 51 or younger, your body mass index should be between 19-25. Your Body mass index is 35.33 kg/m. If this is out of the aformentioned range listed, please consider follow up with your Primary Care Provider.   Our office will call you to schedule your 2nd Ferheme (iron) infusion and repeat labs 2 weeks after the infusion.  Continue taking Entocort 57m (three tabs) once daily. Call CCarl Best CRNP on 12/14/2019 with an update. If you have no diarrhea or abdominal pain the entocort may be reduced to 6 mg daily.  Follow up with Dr SFuller Planin 6 weeks. 01/04/2020  Due to recent changes in healthcare laws, you may see the results of your imaging and laboratory studies on MyChart before your provider has had a chance to review them.  We understand that in some cases there may be results that are confusing or concerning to you. Not all laboratory results come back in the same time frame and the provider may be waiting for multiple results in order to interpret others.  Please give [id number] hours in order for your provider to thoroughly review all the results before contacting the office for clarification of your results.   Thank you for choosing [facility] CN[facility] CRNP

## 2019-11-23 NOTE — Progress Notes (Signed)
Reviewed and agree with management plan.  Yaseen Gilberg T. Casie Sturgeon, MD FACG [facility]  

## 2019-11-23 NOTE — Progress Notes (Signed)
11/23/2019 Erika David [id number] 1976-07-27   Chief Complaint: lymphocytic colitis   History of Present Illness: Erika David is a 44 year old female with a past medical history of anemia secondary to menorrhagia.  Past C section and tubal ligation. She presented to [facility] 11/05/2019 with abdominal pain and diarrhea.  An abdominal/pelvic CT showed pancolitis. She was prescribed Flagyl po and she was discharged home. Her abdominal pain and diarrhea worsened so she presented to [facility] 2/28 for further evaluation. WBC 7.1. Hg 10.3. HCT 34.4. MCV 72.  C.diff and GI panel were negative. She received one dose of Feraheme on 3/03. She underwent a colonoscopy 11/12/2019 by Dr. Fuller Plan which identified lymphocytic colitis. She was prescribed Entocort 48m po daily, Dicyclomine 123mpo qid, Imodium PRN and disscharged home 11/15/2019. She presents today for further GI follow up. She is taking Entocort 17m48maily but she admits to taking it at different times of the day. 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 She is taking Dicyclomine 66m6m tid which has significantly reduced her abdominal pain. She is passing 3 soft formed BMs daily. Two days ago she had one episode of diarrhea that possible occurred after she had a dairy product. She is not taking Imodium. Her stress level was quite high prior to her hospital admission as she underwent a dental procedure requiring antibiotics late JanuFebruary 09, 2024r grandfather died and her sister was diagnosed with Covid 19. 14e continues to have fatigue which is slowly improving.    . CBC Latest Ref Rng & Units 11/13/2019 11/12/2019 11/11/2019  WBC 4.0 - 10.5 K/uL 6.9 6.1 6.2  Hemoglobin 12.0 - 15.0 g/dL 9.5(L) 9.6(L) 9.8(L)  Hematocrit 36.0 - 46.0 % 31.1(L) 31.6(L) 32.7(L)  Platelets 150 - 400 K/uL 298 321 317    CMP Latest Ref Rng & Units 11/14/2019 11/13/2019 11/12/2019  Glucose 70 - 99 mg/dL 121(H) 92 90  BUN 6 - 20 mg/dL 7 <5(L) <5(L)  Creatinine 0.44 - 1.00 mg/dL 0.99 0.83 0.85  Sodium 135 - 145 mmol/L 137 139 136  Potassium 3.5 - 5.1 mmol/L 4.1 3.8 3.4(L)  Chloride 98 - 111 mmol/L 109 111 109  CO2 22 - 32 mmol/L 22 22 21(L)  Calcium 8.9 - 10.3 mg/dL 8.9 9.3 8.8(L)  Total Protein 6.5 - 8.1 g/dL - - -  Total Bilirubin 0.3 - 1.2 mg/dL - - -  Alkaline Phos 38 - 126 U/L - - -  AST 15 - 41 U/L - - -  ALT 0 - 44 U/L - - -   Colonoscopy 11/12/2019: - The examined portion of the ileum was normal. - The cecum to transverse colon and sigmoid to rectum appeared normal. Biopsied. - Mildly congested mucosa in the descending colon. Biopsied. - Internal hemorrhoids. - The colonoscopy was somewhat difficult due to significant looping and a tortuous   Colon. - Biopsies of the right and left colon showed lymphocytic colitis.  -Recall colonoscopy 10 years.   Current Medications, Allergies, Past Medical History, Past Surgical History, Family History and Social History were reviewed in [employer].   Physical Exam: BP 127/64   Pulse 73   Temp 97.8 F (36.6 C)   Ht 5' 1"  (1.549 m)   Wt 187 lb (84.8 kg)   SpO2 98%  BMI 35.33 kg/m  General: Well developed  44 year old female in no acute distress. Head: Normocephalic and atraumatic. Eyes:  No scleral icterus. Conjunctiva pink . Ears: Normal auditory acuity. Lungs: Clear throughout to auscultation. Heart: Regular rate and rhythm, no murmur. Abdomen: Soft, nontender and nondistended. No masses or hepatomegaly. Normal bowel sounds x 4 quadrants.  Rectal: Deferred.  Musculoskeletal: Symmetrical with no gross deformities. Extremities: No edema. Neurological: Alert oriented x 4. No focal deficits.  Psychological:  Alert and cooperative. Normal mood and affect  Assessment and Recommendations:  72.  45 year old female  admitted to [facility] on 2/28 with abdominal pain and diarrhea. CTAP showed pancolitis. GI pathogen and C.diff were negative. Colonoscopy on 3/4 identified lymphocytic colitis.  - Continue Entocort 76m po QD for a total of 4 weeks then reduce to 69mpo QD if diarrhea and abdominal pain resolved. (Patient to call me 4/5 with an update). -Follow up in the office with Dr. StFuller Plann 6 weeks, further Entocort instructions will be provided at that time.  -Continue Dicyclomine 1063mo qid for now, eventually may take PRN -Avoid dairy products and greasy/fatty foods. -Patient to call our office if her symptoms worsen.   2. 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  -Schedule 2nd Feraheme infusion  -check CBC, iron panel 2 weeks after 2nd Feraheme infusion completed

## 2019-11-25 ENCOUNTER — Other Ambulatory Visit: Payer: Self-pay | Admitting: Internal Medicine

## 2019-11-26 ENCOUNTER — Encounter: Payer: Self-pay | Admitting: Nurse Practitioner

## 2019-11-26 ENCOUNTER — Telehealth: Payer: Self-pay | Admitting: Adult Health

## 2019-11-26 ENCOUNTER — Telehealth: Payer: Self-pay | Admitting: Physician Assistant

## 2019-11-26 NOTE — Telephone Encounter (Signed)
There has been a provider change for Ms. Rader to see Mendel Ryder on 3/24 at 2pm w/labs at 1:30pm. Pt aware to arrive 15 minutes early.

## 2019-11-26 NOTE — Telephone Encounter (Signed)
Received a new hem referral from [place] for [place]. Erika David has been cld and scheduled to see Cassie on 3/224 at 1:3pm w/labs at 1pm. Pt aware to arrive 15 minutes early.

## 2019-11-30 ENCOUNTER — Ambulatory Visit: Payer: BC Managed Care – PPO | Admitting: Nurse Practitioner

## 2019-11-30 ENCOUNTER — Other Ambulatory Visit: Payer: Self-pay | Admitting: Adult Health

## 2019-11-30 DIAGNOSIS — D509 Iron deficiency anemia, unspecified: Secondary | ICD-10-CM

## 2019-11-30 DIAGNOSIS — D500 Iron deficiency anemia secondary to blood loss (chronic): Secondary | ICD-10-CM

## 2019-12-02 ENCOUNTER — Inpatient Hospital Stay: Payer: BC Managed Care – PPO

## 2019-12-02 ENCOUNTER — Other Ambulatory Visit: Payer: Self-pay

## 2019-12-02 ENCOUNTER — Encounter: Payer: Self-pay | Admitting: Adult Health

## 2019-12-02 ENCOUNTER — Inpatient Hospital Stay: Payer: BC Managed Care – PPO | Attending: Adult Health | Admitting: Adult Health

## 2019-12-02 ENCOUNTER — Other Ambulatory Visit: Payer: BC Managed Care – PPO

## 2019-12-02 VITALS — BP 130/73 | HR 78 | Temp 97.8°F | Resp 18 | Ht 61.0 in | Wt 193.4 lb

## 2019-12-02 DIAGNOSIS — D500 Iron deficiency anemia secondary to blood loss (chronic): Secondary | ICD-10-CM

## 2019-12-02 DIAGNOSIS — A09 Infectious gastroenteritis and colitis, unspecified: Secondary | ICD-10-CM | POA: Diagnosis not present

## 2019-12-02 DIAGNOSIS — Z79899 Other long term (current) drug therapy: Secondary | ICD-10-CM | POA: Diagnosis not present

## 2019-12-02 DIAGNOSIS — N281 Cyst of kidney, acquired: Secondary | ICD-10-CM | POA: Insufficient documentation

## 2019-12-02 DIAGNOSIS — D509 Iron deficiency anemia, unspecified: Secondary | ICD-10-CM | POA: Diagnosis not present

## 2019-12-02 DIAGNOSIS — R5383 Other fatigue: Secondary | ICD-10-CM | POA: Diagnosis not present

## 2019-12-02 DIAGNOSIS — N920 Excessive and frequent menstruation with regular cycle: Secondary | ICD-10-CM | POA: Diagnosis not present

## 2019-12-02 LAB — RETICULOCYTES
Immature Retic Fract: 21.8 % — ABNORMAL HIGH (ref 2.3–15.9)
RBC.: 4.43 MIL/uL (ref 3.87–5.11)
Retic Count, Absolute: 113.9 10*3/uL (ref 19.0–186.0)
Retic Ct Pct: 2.6 % (ref 0.4–3.1)

## 2019-12-02 LAB — CMP (CANCER CENTER ONLY)
ALT: 11 U/L (ref 0–44)
AST: 10 U/L — ABNORMAL LOW (ref 15–41)
Albumin: 3.9 g/dL (ref 3.5–5.0)
Alkaline Phosphatase: 109 U/L (ref 38–126)
Anion gap: 7 (ref 5–15)
BUN: 9 mg/dL (ref 6–20)
CO2: 25 mmol/L (ref 22–32)
Calcium: 8.8 mg/dL — ABNORMAL LOW (ref 8.9–10.3)
Chloride: 107 mmol/L (ref 98–111)
Creatinine: 0.86 mg/dL (ref 0.44–1.00)
GFR, Est AFR Am: >60 mL/min (ref >60)
GFR, Estimated: >60 mL/min (ref >60)
Glucose, Bld: 129 mg/dL — ABNORMAL HIGH (ref 70–99)
Potassium: 3.5 mmol/L (ref 3.5–5.1)
Sodium: 139 mmol/L (ref 135–145)
Total Bilirubin: 0.2 mg/dL — ABNORMAL LOW (ref 0.3–1.2)
Total Protein: 7 g/dL (ref 6.5–8.1)

## 2019-12-02 LAB — CBC WITH DIFFERENTIAL (CANCER CENTER ONLY)
Abs Immature Granulocytes: 0.02 10*3/uL (ref 0.00–0.07)
Basophils Absolute: 0.1 10*3/uL (ref 0.0–0.1)
Basophils Relative: 1 %
Eosinophils Absolute: 0.2 10*3/uL (ref 0.0–0.5)
Eosinophils Relative: 4 %
HCT: 34.9 % — ABNORMAL LOW (ref 36.0–46.0)
Hemoglobin: 10.8 g/dL — ABNORMAL LOW (ref 12.0–15.0)
Immature Granulocytes: 0 %
Lymphocytes Relative: 28 %
Lymphs Abs: 1.5 10*3/uL (ref 0.7–4.0)
MCH: 24.2 pg — ABNORMAL LOW (ref 26.0–34.0)
MCHC: 30.9 g/dL (ref 30.0–36.0)
MCV: 78.3 fL — ABNORMAL LOW (ref 80.0–100.0)
Monocytes Absolute: 0.3 10*3/uL (ref 0.1–1.0)
Monocytes Relative: 6 %
Neutro Abs: 3.3 10*3/uL (ref 1.7–7.7)
Neutrophils Relative %: 61 %
Platelet Count: 298 10*3/uL (ref 150–400)
RBC: 4.46 MIL/uL (ref 3.87–5.11)
RDW: 25.5 % — ABNORMAL HIGH (ref 11.5–15.5)
WBC Count: 5.5 10*3/uL (ref 4.0–10.5)
nRBC: 0 % (ref 0.0–0.2)

## 2019-12-02 LAB — VITAMIN B12: Vitamin B-12: 228 pg/mL (ref 180–914)

## 2019-12-02 LAB — FOLATE: Folate: 6 ng/mL (ref >5.9)

## 2019-12-02 LAB — SAVE SMEAR(SSMR), FOR PROVIDER SLIDE REVIEW

## 2019-12-02 NOTE — Progress Notes (Addendum)
Erika David  [phone] [phone]     ID: Erika David DOB: September 02, 1976  MR#: [id number]  DZH#:[id number]  Patient Care Team: Erika Buffy, NP as PCP - General (Internal Medicine) Erika Dock, NP OTHER MD:  CHIEF COMPLAINT: iron deficiency  CURRENT TREATMENT: Received IV iron during hospitalization x 1   HISTORY OF CURRENT ILLNESS:  "Erika David" was admitted to the hospital from 11/08/2019 through 11/15/2019.  She was found to have lymphocytic colitis and underwent colonoscopy with Dr. Fuller Plan on 3/4 and was started on Budesonide, Entocort, and Loperamide PRN on 11/13/2019.  Her chronic anemia was noted to be related to menorrhagia and she received one dose of IV iron on 11/11/2019.  Since receiving that IV iron, her hemoglobin improved from 9.7 to 10.8.  Her anemia dates back to 2011 when her hemoglobin was as low as 10.2.  She did have anemia associated with her fourth and fifth pregnancies, however after her fifth pregnancy her mild anemia remained and didn't rebound.  We have two ferritin levels on record, with the first one being on 11/09/2019 which was 8.    Results for Erika David, Erika David (MRN [id number]) as of 12/02/2019 14:17  Ref. Range 03/17/2009 16:27 04/17/2009 23:31 11/04/2009 05:10 10/16/2011 15:28 11/20/2011 09:08 01/21/2012 11:46 03/12/2012 00:30 04/12/2012 05:35 05/29/2013 14:12 05/11/2016 16:46 06/18/2019 19:01 11/05/2019 16:07 12/02/2019 13:33  Hemoglobin Latest Ref Range: 12.0 - 15.0 g/dL 13.0 12.1 10.2 (L) 12.7 11.8 (L) 10.8 (L) 11.2 (L) 9.9 (L) 12.7 10.4 (L) 9.3 (L) 9.7 (L) 10.8 (L)    She has not yet been evaluated by gynecology.  Erika David notes that she has been having heavier cycles since having her tubal ligation.  She notes it is 7 days in length, sometimes longer.  Her pad count is about 3-5 per day for about 4-7 days.  Then it will lighten and taper off.    The patient's subsequent history is as detailed below.  INTERVAL HISTORY: Erika David has been feeling  moderately well since her discharge.  She is fatigued.  She notes this is problematic for her.  She is also worried because of her new GI issue that she hasn't had to deal with before.  She is unable to oral iron due to significant constipation.    REVIEW OF SYSTEMS: Erika David denies any headaches, fever, chills, shortness of breath, cough, bowel/bladder changes, nausea, vomiting, skin changes, or any other concerns.  A detailed ROS was otherwise non contributory.    PAST MEDICAL HISTORY: Past Medical History:  Diagnosis Date  . Abnormal Pap smear     LEEP?  LAST PAP 05/2009  . Anemia 01/04/2012  . Chlamydia   . H/O candidiasis   . H/O dysmenorrhea 03/13/02  . H/O gonorrhea    treated  . H/O menorrhagia 07/09/02  . Infection 2006   GONORHRREA  . Infection 2006   CHLAMYDIA  . Infection    FREQ YEAST  . Pelvic injury AS TEEN   SPORTS RELATED  . Pelvic pain 07/11/10  . Trichomonas   . Yeast infection     PAST SURGICAL HISTORY: Past Surgical History:  Procedure Laterality Date  . BIOPSY  11/12/2019   Procedure: BIOPSY;  Surgeon: Ladene Artist, MD;  Location: [facility];  Service: Endoscopy;;  . CESAREAN SECTION  04/11/2012   Procedure: CESAREAN SECTION;  Surgeon: Delice Lesch, MD;  Location: [facility];  Service: Gynecology;  Laterality: N/A;  twins  . COLONOSCOPY WITH PROPOFOL N/A 11/12/2019  Procedure: COLONOSCOPY WITH PROPOFOL;  Surgeon: Ladene Artist, MD;  Location: [facility];  Service: Endoscopy;  Laterality: N/A;  . GYNECOLOGIC CRYOSURGERY    . TUBAL LIGATION    . WISDOM TOOTH EXTRACTION  2007    FAMILY HISTORY Family History  Problem Relation Age of Onset  . Hypertension Mother   . Cancer Mother        BREAST  . Thyroid disease Mother   . Diabetes Father   . Hypertension Sister   . Lupus Maternal Aunt   . Liver disease Sister   . Asthma Cousin   . Hypertension Maternal Grandmother   . Diabetes Maternal Grandmother   . Heart disease Maternal Grandfather   .  Stroke Maternal Grandfather   . Alcohol abuse Maternal Aunt   . Drug abuse Maternal Aunt   . Anesthesia problems Neg Hx     GYNECOLOGIC HISTORY:  No LMP recorded. Menarche: 44 years old Age at first live birth: 44 years old [place] P 5 LMP 11/20/2019 Contraceptive tubal ligation HRT/OCP: none  Hysterectomy? no Salpingo-oophorectomy?no    SOCIAL HISTORY:  Erika David is "common law married" to her significant other [employer].  She lives with him in [place], [place].  Her children are: daughter 32, daughter 74, son 66, twin boy and girl who are 7.  All of her children live with her at home.  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  Erika David enjoys reading.       ADVANCED DIRECTIVES:    HEALTH MAINTENANCE: Social History   Tobacco Use  . Smoking status: Never Smoker  . Smokeless tobacco: Never Used  Substance Use Topics  . Alcohol use: No    Comment: occasional alcohol  . Drug use: No     Colonoscopy:11/12/2019  PAP: 2019  Bone density: not indicated   Allergies  Allergen Reactions  . Dust Mite Extract Anaphylaxis  . Kiwi Extract Itching and Swelling  . Pollen Extract Anaphylaxis    Tree pollen in environment    Current Outpatient Medications  Medication Sig Dispense Refill  . budesonide (ENTOCORT EC) 3 MG 24 hr capsule Take 3 capsules (9 mg total) by mouth daily. 90 capsule 0  . cetirizine (ZYRTEC) 10 MG tablet Take 10 mg by mouth daily.    [name] dicyclomine (BENTYL) 10 MG capsule Take 1 capsule (10 mg total) by mouth 4 (four) times daily -  before meals and at bedtime. 120 capsule 0  . loperamide (IMODIUM) 2 MG capsule Take 1 capsule (2 mg total) by mouth 3 (three) times daily between meals as needed for diarrhea or loose stools. 30 capsule 0   No current facility-administered medications for this visit.    OBJECTIVE:  Vitals:   12/02/19 1350  BP: 130/73  Pulse: 78  Resp: 18  Temp: 97.8 F (36.6 C)  SpO2: 100%     Body mass index is 36.54  kg/m.   Wt Readings from Last 3 Encounters:  12/02/19 193 lb 6.4 oz (87.7 kg)  11/23/19 187 lb (84.8 kg)  11/18/19 187 lb 12.8 oz (85.2 kg)      ECOG FS:1 - Symptomatic but completely ambulatory  GENERAL: Patient is a well appearing female in no acute distress HEENT:  Sclerae anicteric.  Oropharynx clear and moist. No ulcerations or evidence of oropharyngeal candidiasis. Neck is supple.  NODES:  No cervical, supraclavicular, or axillary lymphadenopathy palpated.  BREAST EXAM:  Deferred. LUNGS:  Clear to auscultation bilaterally.  No wheezes or rhonchi. HEART:  Regular rate and rhythm. No murmur appreciated. ABDOMEN:  Soft, nontender.  Positive, normoactive bowel sounds. No organomegaly palpated. MSK:  No focal spinal tenderness to palpation. Full range of motion bilaterally in the upper extremities. EXTREMITIES:  No peripheral edema.   SKIN:  Clear with no obvious rashes or skin changes. No nail dyscrasia. NEURO:  Nonfocal. Well oriented.  Appropriate affect.    LAB RESULTS:  CMP     Component Value Date/Time   NA 137 11/14/2019 0605   K 4.1 11/14/2019 0605   CL 109 11/14/2019 0605   CO2 22 11/14/2019 0605   GLUCOSE 121 (H) 11/14/2019 0605   BUN 7 11/14/2019 0605   CREATININE 0.99 11/14/2019 0605   CREATININE 0.89 05/29/2013 1403   CALCIUM 8.9 11/14/2019 0605   PROT 6.9 11/09/2019 0543   ALBUMIN 3.7 11/09/2019 0543   AST 20 11/09/2019 0543   ALT 14 11/09/2019 0543   ALKPHOS 85 11/09/2019 0543   BILITOT 0.2 (L) 11/09/2019 0543   GFRNONAA >60 11/14/2019 0605   GFRAA >60 11/14/2019 0605    No results found for: TOTALPROTELP, ALBUMINELP, A1GS, A2GS, BETS, BETA2SER, GAMS, MSPIKE, SPEI  No results found for: Nils Pyle, [facility]  Lab Results  Component Value Date   WBC 5.5 12/02/2019   NEUTROABS 3.3 12/02/2019   HGB 10.8 (L) 12/02/2019   HCT 34.9 (L) 12/02/2019   MCV 78.3 (L) 12/02/2019   PLT 298 12/02/2019      Chemistry      Component Value  Date/Time   NA 137 11/14/2019 0605   K 4.1 11/14/2019 0605   CL 109 11/14/2019 0605   CO2 22 11/14/2019 0605   BUN 7 11/14/2019 0605   CREATININE 0.99 11/14/2019 0605   CREATININE 0.89 05/29/2013 1403      Component Value Date/Time   CALCIUM 8.9 11/14/2019 0605   ALKPHOS 85 11/09/2019 0543   AST 20 11/09/2019 0543   ALT 14 11/09/2019 0543   BILITOT 0.2 (L) 11/09/2019 0543       No results found for: LABCA2  No components found for: [id number]  No results for input(s): INR in the last 168 hours.  No results found for: LABCA2  No results found for: SHU837  No results found for: GBM211  No results found for: DBZ208  No results found for: CA2729  No components found for: HGQUANT  No results found for: CEA1 / No results found for: CEA1   No results found for: AFPTUMOR  No results found for: CHROMOGRNA  No results found for: PSA1  Appointment on 12/02/2019  Component Date Value Ref Range Status  . WBC Count 12/02/2019 5.5  4.0 - 10.5 K/uL Final  . RBC 12/02/2019 4.46  3.87 - 5.11 MIL/uL Final  . Hemoglobin 12/02/2019 10.8* 12.0 - 15.0 g/dL Final   Comment: Reticulocyte Hemoglobin testing may be clinically indicated, consider ordering this additional test [id number]   . HCT 12/02/2019 34.9* 36.0 - 46.0 % Final  . MCV 12/02/2019 78.3* 80.0 - 100.0 fL Final  . MCH 12/02/2019 24.2* 26.0 - 34.0 pg Final  . MCHC 12/02/2019 30.9  30.0 - 36.0 g/dL Final  . RDW 12/02/2019 25.5* 11.5 - 15.5 % Final  . Platelet Count 12/02/2019 298  150 - 400 K/uL Final  . nRBC 12/02/2019 0.0  0.0 - 0.2 % Final  . Neutrophils Relative % 12/02/2019 61  % Final  . Neutro Abs 12/02/2019 3.3  1.7 - 7.7 K/uL Final  .  Lymphocytes Relative 12/02/2019 28  % Final  . Lymphs Abs 12/02/2019 1.5  0.7 - 4.0 K/uL Final  . Monocytes Relative 12/02/2019 6  % Final  . Monocytes Absolute 12/02/2019 0.3  0.1 - 1.0 K/uL Final  . Eosinophils Relative 12/02/2019 4  % Final  . Eosinophils Absolute  12/02/2019 0.2  0.0 - 0.5 K/uL Final  . Basophils Relative 12/02/2019 1  % Final  . Basophils Absolute 12/02/2019 0.1  0.0 - 0.1 K/uL Final  . Immature Granulocytes 12/02/2019 0  % Final  . Abs Immature Granulocytes 12/02/2019 0.02  0.00 - 0.07 K/uL Final   Performed at [facility], [place] [address], [place], [place] [address]  . Retic Ct Pct 12/02/2019 2.6  0.4 - 3.1 % Final  . RBC. 12/02/2019 4.43  3.87 - 5.11 MIL/uL Final  . Retic Count, Absolute 12/02/2019 113.9  19.0 - 186.0 K/uL Final  . Immature Retic Fract 12/02/2019 21.8* 2.3 - 15.9 % Final   Performed at [facility], [place] [address], [place], [place] [address]  . Smear Review 12/02/2019 SMEAR STAINED AND AVAILABLE FOR REVIEW   Final   Performed at [facility], [address], [place], [place] [address]    (this displays the last labs from the last 3 days)  No results found for: TOTALPROTELP, ALBUMINELP, A1GS, A2GS, BETS, BETA2SER, GAMS, MSPIKE, SPEI (this displays SPEP labs)  No results found for: KPAFRELGTCHN, LAMBDASER, KAPLAMBRATIO (kappa/lambda light chains)  No results found for: HGBA, HGBA2QUANT, HGBFQUANT, HGBSQUAN (Hemoglobinopathy evaluation)   No results found for: LDH  Lab Results  Component Value Date   IRON 45 11/18/2019   TIBC 362 11/18/2019   IRONPCTSAT 12 (L) 11/18/2019   (Iron and TIBC)  Lab Results  Component Value Date   FERRITIN 22 11/18/2019    Urinalysis    Component Value Date/Time   COLORURINE YELLOW 11/05/2019 1546   APPEARANCEUR CLEAR 11/05/2019 1546   LABSPEC >1.030 (H) 11/05/2019 1546   PHURINE 6.0 11/05/2019 1546   GLUCOSEU NEGATIVE 11/05/2019 1546   HGBUR NEGATIVE 11/05/2019 [place] 11/05/2019 1546   BILIRUBINUR neg 05/29/2013 1412   KETONESUR NEGATIVE 11/05/2019 1546   PROTEINUR NEGATIVE 11/05/2019 1546   UROBILINOGEN 0.2 05/29/2013 1412   UROBILINOGEN 0.2 04/08/2012 2330    NITRITE NEGATIVE 11/05/2019 1546   LEUKOCYTESUR NEGATIVE 11/05/2019 1546     STUDIES: CT ABDOMEN PELVIS W CONTRAST  Result Date: 11/05/2019 CLINICAL DATA:  Left lower quadrant abdominal pain and diarrhea. EXAM: CT ABDOMEN AND PELVIS WITH CONTRAST TECHNIQUE: Multidetector CT imaging of the abdomen and pelvis was performed using the standard protocol following bolus administration of intravenous contrast. CONTRAST:  127m OMNIPAQUE IOHEXOL 300 MG/ML  SOLN COMPARISON:  CT scan from 2017 FINDINGS: Lower chest: The lung bases are clear of an acute process. Minimal dependent atelectasis. The heart is normal in size. No pericardial effusion. Hepatobiliary: No focal hepatic lesions or intrahepatic biliary dilatation. The gallbladder is normal. No common bile duct dilatation. The portal and hepatic veins are patent. Pancreas: No mass, inflammation or ductal dilatation. Spleen: Normal size. No focal lesions. Adrenals/Urinary Tract: The adrenal glands and kidneys are normal. The bladder is normal. Stomach/Bowel: The stomach, duodenum, small bowel and terminal ileum are normal. The appendix is normal. There is a diffuse and fairly marked inflammatory or infectious colitis involving the cecum, the entire ascending colon, transverse colon and descending colon. I do not see any obvious involvement of  the rectum or sigmoid colon. No CT findings for diverticulitis. Vascular/Lymphatic: The aorta and branch vessels are patent. No atherosclerotic calcifications. The major venous structures are patent. Small scattered retroperitoneal and mesenteric lymph nodes but no mass or overt adenopathy. Reproductive: The uterus is unremarkable. Both ovaries are normal except for small right renal cysts. Other: No pelvic adenopathy or free pelvic fluid collections. No inguinal mass or adenopathy. Musculoskeletal: No significant bony findings. IMPRESSION: 1. Diffuse and fairly marked inflammatory or infectious colitis. 2. No other  significant abdominal/pelvic findings. Electronically Signed   By: Marijo Sanes M.D.   On: 11/05/2019 17:12   DG ABD ACUTE 2+V W 1V CHEST  Result Date: 11/09/2019 CLINICAL DATA:  Umbilical abdominal pain and diarrhea for 4 days. EXAM: DG ABDOMEN ACUTE W/ 1V CHEST COMPARISON:  11/05/2019 FINDINGS: There is no evidence of dilated bowel loops or free intraperitoneal air. No radiopaque calculi or other significant radiographic abnormality is seen. Heart size and mediastinal contours are within normal limits. Both lungs are clear. IMPRESSION: Negative abdominal radiographs.  No acute cardiopulmonary disease. Electronically Signed   By: Kerby Moors M.D.   On: 11/09/2019 09:44    ASSESSMENT: 44 y.o. [place] woman here today for evaluation regarding iron deficiency.    1. Microcytic anemia dating back to 2013, unable to tolerate oral iron  (a) No work-up until 11/09/2019, ferritin was 8  (b) Received IV iron--Feraheme 578m on 11/11/2019 in the hospital  (c) to receive second IV iron--feraheme on 12/07/2019  (d) Secondary to menorrhagia, gynecology evaluation pending  2. Lymphocytic colitis managed by Dr. SFuller Plan (a) Colonoscopy 11/12/2019  PLAN:  SOk Edwardsis doing moderately well today.  She met with myself and Dr. MJana Hakim  I reviewed with her that she has 3 major blood cell lines are made from her bone marrow.    1. White blood cells, or immune cells.  These fight infection.   Her WBCs are normal in number and appearance.  2. Platelets, or clotting cells.  These help her blood clot, or help to form a scab when she gets cut.  Hers are normal in number and appearance.    3. Red Blood Cells.  These carry oxygen throughout the body.  Hers are decreased in number and are smaller in size than they should be.  This is called microcytic anemia.    There  A few possible causes of microcytic anemia, one of which is iron deficiency.  Her ferritin levels were tested while she was hospitalized and were  decreased.  We reviewed that there is only one way to lose iron, and that is to bleed.  She is having heavy menses, which is likely the cause of her iron deficiency.    While in the hospital she received her first dose of IV iron and tolerated this without difficulty.  She will need one more dose of the IV iron.  I have placed orders for this and she will complete this on 12/07/2019.    I provided her with a work note for today and for Monday.    We will see SOk Edwardsback in June for her next visit.  We will repeat lab work in May, a couple of weeks prior to her June appointment.  She was recommended to continue with the appropriate pandemic precautions. She knows to call for any questions that may arise between now and her next appointment.  We are happy to see her sooner if needed.  Total encounter time: 451  minutes  Wilber Bihari, NP 12/02/19 2:24 PM Medical Oncology and Hematology [facility] [place], [place] [address] Tel. [phone]    Fax. [phone]   ADDENDUM: 44 year old [place] woman with lymphocytic colitis and longstanding menorrhagia, presenting with well documented iron deficiency, status post 1 dose of Feraheme.  We reviewed the fact that all blood cells are made in the marrow, and that her white cells and platelets are unremarkable.  Her red cells are unremarkable morphologically except for microcytosis and the a mild anisocytosis consistent with iron deficiency.  She understands that while she continues to menstruate heavily she will become repeatedly iron deficient in the future.  She can discussed various ways of interrupting her periods for example goserelin when she meets with her gynecologist.  She understands that drugs like a goserelin are very effective interrupting periods but also precipitate symptoms of menopause  We are going to set her up for a second dose of Feraheme--notes she tolerated the first without event--and then she will  need long-term follow-up until the menorrhagia has resolved.  We will recheck labs in June and see her at that point to make sure everything is in place.  Thereafter she can have lab work every 3 months with visits once a year as needed  Total encounter time 50 minutes.*   I personally saw this patient and performed a substantive portion of this encounter with the listed APP documented above.   [name], MD Medical Oncology and Hematology [facility] [address] [place], [place] [address] Tel. [phone]    Fax. [phone]   *Total Encounter Time as defined by the Centers for Medicare and Medicaid Services includes, in addition to the face-to-face time of a patient visit (documented in the note above) non-face-to-face time: obtaining and reviewing outside history, ordering and reviewing medications, tests or procedures, care coordination (communications with other health care professionals or caregivers) and documentation in the medical record.

## 2019-12-02 NOTE — Patient Instructions (Signed)

## 2019-12-03 ENCOUNTER — Telehealth: Payer: Self-pay | Admitting: Adult Health

## 2019-12-03 NOTE — Telephone Encounter (Signed)
Scheduled appts per 3/24 los. Left voicemail with appt details.

## 2019-12-07 ENCOUNTER — Inpatient Hospital Stay: Payer: BC Managed Care – PPO

## 2019-12-07 ENCOUNTER — Telehealth: Payer: Self-pay | Admitting: Oncology

## 2019-12-07 NOTE — Telephone Encounter (Signed)
Rescheduled iron infusion per 3/29 sch msg. Pt confirmed new appt date and time.

## 2019-12-14 ENCOUNTER — Other Ambulatory Visit: Payer: Self-pay | Admitting: Oncology

## 2019-12-14 ENCOUNTER — Other Ambulatory Visit: Payer: Self-pay

## 2019-12-14 ENCOUNTER — Inpatient Hospital Stay: Payer: BC Managed Care – PPO | Attending: Adult Health

## 2019-12-14 VITALS — BP 120/74 | HR 81 | Temp 98.2°F | Resp 18

## 2019-12-14 DIAGNOSIS — Z79899 Other long term (current) drug therapy: Secondary | ICD-10-CM | POA: Diagnosis not present

## 2019-12-14 DIAGNOSIS — D509 Iron deficiency anemia, unspecified: Secondary | ICD-10-CM | POA: Diagnosis not present

## 2019-12-14 DIAGNOSIS — D500 Iron deficiency anemia secondary to blood loss (chronic): Secondary | ICD-10-CM

## 2019-12-14 MED ORDER — ACETAMINOPHEN 325 MG PO TABS
ORAL_TABLET | ORAL | Status: AC
  Filled 2019-12-14: qty 2

## 2019-12-14 MED ORDER — SODIUM CHLORIDE 0.9 % IV SOLN
INTRAVENOUS | Status: DC
  Filled 2019-12-14: qty 250

## 2019-12-14 MED ORDER — DIPHENHYDRAMINE HCL 50 MG/ML IJ SOLN
25.0000 mg | Freq: Once | INTRAMUSCULAR | Status: AC
  Administered 2019-12-14: 25 mg via INTRAVENOUS

## 2019-12-14 MED ORDER — ACETAMINOPHEN 325 MG PO TABS
650.0000 mg | ORAL_TABLET | Freq: Once | ORAL | Status: AC
  Administered 2019-12-14: 650 mg via ORAL

## 2019-12-14 MED ORDER — DIPHENHYDRAMINE HCL 50 MG/ML IJ SOLN
INTRAMUSCULAR | Status: AC
  Filled 2019-12-14: qty 1

## 2019-12-14 MED ORDER — SODIUM CHLORIDE 0.9 % IV SOLN
510.0000 mg | Freq: Once | INTRAVENOUS | Status: AC
  Administered 2019-12-14: 510 mg via INTRAVENOUS
  Filled 2019-12-14: qty 17

## 2019-12-14 NOTE — Patient Instructions (Signed)

## 2020-01-04 ENCOUNTER — Ambulatory Visit (INDEPENDENT_AMBULATORY_CARE_PROVIDER_SITE_OTHER): Payer: BC Managed Care – PPO | Admitting: Gastroenterology

## 2020-01-04 ENCOUNTER — Encounter: Payer: Self-pay | Admitting: Gastroenterology

## 2020-01-04 VITALS — BP 110/76 | HR 76 | Temp 97.9°F | Ht 61.0 in | Wt 191.0 lb

## 2020-01-04 DIAGNOSIS — K52832 Lymphocytic colitis: Secondary | ICD-10-CM

## 2020-01-04 MED ORDER — BUDESONIDE 3 MG PO CPEP: 3.0000 mg | ORAL_CAPSULE | Freq: Every day | ORAL | 3 refills | Status: DC

## 2020-01-04 NOTE — Patient Instructions (Addendum)
We have sent the following medications to your pharmacy for you to pick up at your convenience: budesonide. Take 3 tablets (294m) by mouth daily x 2 months, then decrease to 2 tablets (669m by mouth daily x 1 week, then decrease to 1 tablet (94m59mby mouth daily x 1 week, then stop.  Thank you for choosing [facility] and [facility].  MalPricilla RiffletaDagoberto LigasMD., FACMarval Regal

## 2020-01-04 NOTE — Progress Notes (Signed)
    History of Present Illness: This is a 44 year old female with lymphocytic colitis.  Diarrhea was under excellent control while taking budesonide after her prescription ran out diarrhea returned with occasional crampy lower abdominal pain.  Current Medications, Allergies, Past Medical History, Past Surgical History, Family History and Social History were reviewed in [employer] record.  Physical Exam: General: Well developed, well nourished, no acute distress Head: Normocephalic and atraumatic Eyes:  sclerae anicteric, EOMI Ears: Normal auditory acuity Mouth: Not examined, mask on during Covid-19 pandemic Lungs: Clear throughout to auscultation Heart: Regular rate and rhythm; no murmurs, rubs or bruits Abdomen: Soft, non tender and non distended. No masses, hepatosplenomegaly or hernias noted. Normal Bowel sounds Rectal: Not done  Musculoskeletal: Symmetrical with no gross deformities  Pulses:  Normal pulses noted Extremities: No clubbing, cyanosis, edema or deformities noted Neurological: Alert oriented x 4, grossly nonfocal Psychological:  Alert and cooperative. Normal mood and affect   Assessment and Recommendations:  1. Lymphocytic colitis.  Resume budesonide 9 mg daily for 2 months then taper to 6 mg daily for 1 week and 3 mg daily for 1 week and then discontinue.  Dicyclomine 10 mg 4 times daily as needed lower abdominal pain, cramping.  Patient advised to call diarrhea or abdominal pain or not under very good control with above regimen.  REV in about 3 months.   2.  Chronic iron deficiency anemia related to AUB.  Follow-up with hematology as planned.

## 2020-01-19 ENCOUNTER — Encounter (HOSPITAL_BASED_OUTPATIENT_CLINIC_OR_DEPARTMENT_OTHER): Payer: Self-pay | Admitting: Emergency Medicine

## 2020-01-19 ENCOUNTER — Other Ambulatory Visit: Payer: Self-pay

## 2020-01-19 ENCOUNTER — Emergency Department (HOSPITAL_BASED_OUTPATIENT_CLINIC_OR_DEPARTMENT_OTHER): Payer: BC Managed Care – PPO

## 2020-01-19 ENCOUNTER — Emergency Department (HOSPITAL_BASED_OUTPATIENT_CLINIC_OR_DEPARTMENT_OTHER)
Admission: EM | Admit: 2020-01-19 | Discharge: 2020-01-19 | Disposition: A | Discharge status: Home / Self Care | Payer: BC Managed Care – PPO | Attending: Emergency Medicine | Admitting: Emergency Medicine

## 2020-01-19 DIAGNOSIS — R05 Cough: Secondary | ICD-10-CM | POA: Diagnosis not present

## 2020-01-19 DIAGNOSIS — Z79899 Other long term (current) drug therapy: Secondary | ICD-10-CM | POA: Diagnosis not present

## 2020-01-19 DIAGNOSIS — Z91018 Allergy to other foods: Secondary | ICD-10-CM | POA: Insufficient documentation

## 2020-01-19 DIAGNOSIS — M7918 Myalgia, other site: Secondary | ICD-10-CM | POA: Diagnosis not present

## 2020-01-19 DIAGNOSIS — U071 COVID-19: Secondary | ICD-10-CM | POA: Diagnosis not present

## 2020-01-19 LAB — SARS CORONAVIRUS 2 BY RT PCR (HOSPITAL ORDER, PERFORMED IN ~~LOC~~ HOSPITAL LAB): SARS Coronavirus 2: POSITIVE — AB

## 2020-01-19 NOTE — ED Provider Notes (Signed)
I received this patient in signout from Dr. Wyvonnia Dusky. Suspected COVID, +sick contacts, CXR clear. Awaiting PCR results. COVID PCR positive. Discussed results w/ patient and family. Reviewed quarantine instructions, supportive measures, and return precautions.    Tionne Carelli, Wenda Overland, MD 01/19/20 [phone]

## 2020-01-19 NOTE — Discharge Instructions (Addendum)
You have COVID. Keep yourself quarantined as you are doing for a total of 14 days or until you are fever free for 24 hours. Use tylenol as needed for aches and fever. Keep yourself hydrated. Return to the ED if you develop difficulty breathing, chest pain, inability to eat or drink, or any other concerns.

## 2020-01-19 NOTE — ED Provider Notes (Signed)
[facility] EMERGENCY DEPARTMENT Provider Note   CSN: [id number] Arrival date & time: 01/19/20  0602     History Chief Complaint  Patient presents with  . Covid Exposure    Erika David is a 44 y.o. female.  Patient with history of lymphocytic colitis on chronic budesonide, iron deficiency anemia presenting with a 5-day history of body aches, chills, cough, headache, congestion.  He is concerned for Covid.  She states she has been coughing since May 1 but not able to bring up anything.  She had a negative Covid test at [facility] on May 6.  She states 2 of her 5 children tested positive for Covid this week.  She works for [employer] and has been around multiple cases at school as well.  She is been taking Tylenol at home without relief.  States she cannot take NSAIDs due to her colitis.  She has chronic diarrhea of 4-5 times daily which is unchanged.  There is been no vomiting.  She has some chest tightness with coughing and some shortness of breath.  She denies any difficulty swallowing.  She denies any pain with urination or blood in the urine.  She comes in this morning because she is tired of feeling miserable and is worried about missing more school.  The history is provided by the patient.       Past Medical History:  Diagnosis Date  . Abnormal Pap smear     LEEP?  LAST PAP 05/2009  . Anemia 01/04/2012  . Chlamydia   . H/O candidiasis   . H/O dysmenorrhea 03/13/02  . H/O gonorrhea    treated  . H/O menorrhagia 07/09/02  . Infection 2006   GONORHRREA  . Infection 2006   CHLAMYDIA  . Infection    FREQ YEAST  . Pelvic injury AS TEEN   SPORTS RELATED  . Pelvic pain 07/11/10  . Trichomonas   . Yeast infection     Patient Active Problem List   Diagnosis Date Noted  . Iron deficiency anemia due to chronic blood loss 11/30/2019  . Lymphocytic colitis 11/13/2019  . Diarrhea   . Abnormal CT scan, colon   . Generalized weakness 11/09/2019  . Pancolitis  ([place]) 11/08/2019  . Vaginal discharge 05/14/2016  . Well adult on routine health check 05/14/2016  . S/P cesarean section 04/12/2012  . Sterilization 01/04/2012  . Overweight(278.02) 01/04/2012  . Anemia 01/04/2012  . Seasonal allergies 01/04/2012    Past Surgical History:  Procedure Laterality Date  . BIOPSY  11/12/2019   Procedure: BIOPSY;  Surgeon: Ladene Artist, MD;  Location: [facility];  Service: Endoscopy;;  . CESAREAN SECTION  04/11/2012   Procedure: CESAREAN SECTION;  Surgeon: Delice Lesch, MD;  Location: [facility];  Service: Gynecology;  Laterality: N/A;  twins  . COLONOSCOPY WITH PROPOFOL N/A 11/12/2019   Procedure: COLONOSCOPY WITH PROPOFOL;  Surgeon: Ladene Artist, MD;  Location: [facility];  Service: Endoscopy;  Laterality: N/A;  . GYNECOLOGIC CRYOSURGERY    . TUBAL LIGATION    . WISDOM TOOTH EXTRACTION  2007     OB History    Gravida  6   Para  4   Term  4   Preterm      AB  2   Living  5     SAB  1   TAB  1   Ectopic      Multiple  1   Live Births  5  Family History  Problem Relation Age of Onset  . Hypertension Mother   . Cancer Mother        BREAST  . Thyroid disease Mother   . Diabetes Father   . Hypertension Sister   . Lupus Maternal Aunt   . Liver disease Sister   . Asthma Cousin   . Hypertension Maternal Grandmother   . Diabetes Maternal Grandmother   . Heart disease Maternal Grandfather   . Stroke Maternal Grandfather   . Alcohol abuse Maternal Aunt   . Drug abuse Maternal Aunt   . Anesthesia problems Neg Hx     Social History   Tobacco Use  . Smoking status: Never Smoker  . Smokeless tobacco: Never Used  Substance Use Topics  . Alcohol use: Yes    Comment: occasional alcohol  . Drug use: No    Home Medications Prior to Admission medications   Medication Sig Start Date End Date Taking? Authorizing Provider  budesonide (ENTOCORT EC) 3 MG 24 hr capsule Take 1 capsule (3 mg total) by mouth daily.  01/04/20   Ladene Artist, MD  cetirizine (ZYRTEC) 10 MG tablet Take 10 mg by mouth daily.    [provider]  dicyclomine (BENTYL) 10 MG capsule Take 1 capsule (10 mg total) by mouth 4 (four) times daily -  before meals and at bedtime. 11/15/19 12/15/19  Kayleen Memos, DO  loperamide (IMODIUM) 2 MG capsule Take 1 capsule (2 mg total) by mouth 3 (three) times daily between meals as needed for diarrhea or loose stools. 11/15/19   Kayleen Memos, DO    Allergies    Dust mite extract, Kiwi extract, and Pollen extract  Review of Systems   Review of Systems  Constitutional: Positive for activity change, appetite change, chills, fatigue and fever.  HENT: Positive for congestion, rhinorrhea and sore throat.   Eyes: Negative for visual disturbance.  Respiratory: Positive for cough and chest tightness. Negative for shortness of breath.   Cardiovascular: Negative for chest pain.  Gastrointestinal: Positive for diarrhea. Negative for abdominal pain and vomiting.  Genitourinary: Negative for dysuria and hematuria.  Musculoskeletal: Positive for arthralgias and myalgias.  Skin: Negative for rash.  Neurological: Positive for weakness, light-headedness and headaches.   all other systems are negative except as noted in the HPI and PMH.    Physical Exam Updated Vital Signs BP (!) 145/103 (BP Location: Left Arm)   Pulse 88   Temp 98.8 F (37.1 C) (Oral)   Resp 20   Ht 5' 2.5" (1.588 m)   Wt 86.6 kg   SpO2 98%   BMI 34.38 kg/m   Physical Exam Vitals and nursing note reviewed.  Constitutional:      General: She is not in acute distress.    Appearance: She is well-developed.     Comments: Nontoxic, no distress, no increased work of breathing  HENT:     Head: Normocephalic and atraumatic.     Mouth/Throat:     Mouth: Mucous membranes are moist.     Pharynx: No oropharyngeal exudate.  Eyes:     Conjunctiva/sclera: Conjunctivae normal.     Pupils: Pupils are equal, round, and reactive  to light.  Neck:     Comments: No meningismus. Cardiovascular:     Rate and Rhythm: Normal rate and regular rhythm.     Heart sounds: Normal heart sounds. No murmur.  Pulmonary:     Effort: Pulmonary effort is normal. No respiratory distress.  Breath sounds: Normal breath sounds.  Chest:     Chest wall: No tenderness.  Abdominal:     Palpations: Abdomen is soft.     Tenderness: There is no abdominal tenderness. There is no guarding or rebound.  Musculoskeletal:        General: No tenderness. Normal range of motion.     Cervical back: Normal range of motion and neck supple.  Skin:    General: Skin is warm.     Capillary Refill: Capillary refill takes less than 2 seconds.  Neurological:     General: No focal deficit present.     Mental Status: She is alert and oriented to person, place, and time. Mental status is at baseline.     Cranial Nerves: No cranial nerve deficit.     Motor: No abnormal muscle tone.     Coordination: Coordination normal.     Comments:  5/5 strength throughout. CN 2-12 intact.Equal grip strength.   Psychiatric:        Behavior: Behavior normal.     ED Results / Procedures / Treatments   Labs (all labs ordered are listed, but only abnormal results are displayed) Labs Reviewed  SARS CORONAVIRUS 2 BY RT PCR (HOSPITAL ORDER, [facility])    EKG None  Radiology DG Chest Portable 1 View  Result Date: 01/19/2020 CLINICAL DATA:  Cough and aches, probable COVID EXAM: PORTABLE CHEST 1 VIEW COMPARISON:  10/19/2019 FINDINGS: Normal heart size and mediastinal contours. Low lung volumes. No acute infiltrate or edema. No effusion or pneumothorax. No acute osseous findings. IMPRESSION: Negative low volume chest. Electronically Signed   By: Monte Fantasia M.D.   On: 01/19/2020 07:05    Procedures Procedures (including critical care time)  Medications Ordered in ED Medications - No data to display  ED Course  I have reviewed the  triage vital signs and the nursing notes.  Pertinent labs & imaging results that were available during my care of the patient were reviewed by me and considered in my medical decision making (see chart for details).    MDM Rules/Calculators/A&P                     Cough since May 1.  Body aches, congestion, headache, fatigue for the past 4 to 5 days with multiple known Covid exposures.  Negative Covid test on May 6.  No increased work of breathing or hypoxia.  Discussed with patient that she almost certainly has Covid with her multiple exposures and symptoms.  Will obtain chest x-ray since she has been coughing for 10 days. Repeat Covid test today but advised patient to keep her self quarantined no matter the results.  CXR negative.  No hypoxia or increased work of breathing. BP elevated today. Advised followup with PCP.  Continue quarantine at home, antipyretics, oral hydration. Return to the ED with chest pain, shortness of breathing, or any other concerns.  Erika David was evaluated in Emergency Department on 01/19/2020 for the symptoms described in the history of present illness. She was evaluated in the context of the global COVID-19 pandemic, which necessitated consideration that the patient might be at risk for infection with the SARS-CoV-2 virus that causes COVID-19. Institutional protocols and algorithms that pertain to the evaluation of patients at risk for COVID-19 are in a state of rapid change based on information released by regulatory bodies including the CDC and federal and state organizations. These policies and algorithms were followed during  the patient's care in the ED.  Final Clinical Impression(s) / ED Diagnoses Final diagnoses:  None    Rx / DC Orders ED Discharge Orders    None       Eathan Groman, Annie Main, MD 01/19/20 [phone]

## 2020-01-19 NOTE — ED Triage Notes (Signed)
Pt is c/o body aches, cough, headache  Pt sates her children tested positive for Covid recently

## 2020-01-20 ENCOUNTER — Telehealth: Payer: Self-pay | Admitting: Nurse Practitioner

## 2020-01-20 NOTE — Telephone Encounter (Signed)
Please schedule video appt to re eval her symptoms. She will need BP machine, thermometer and possibly pulse oximetry for this visit.

## 2020-01-20 NOTE — Telephone Encounter (Signed)
Patient is calling and wanted to let Baldo Ash know that she tested positive for covid on 5/11 and that her BP was high and that is was documented in her chart. Patient stated she feels terrible with a sore throat, cough and headache. Patient asked if there was something that she can take over the counter or be prescribed. Pls advise. CB is [phone]

## 2020-01-20 NOTE — Telephone Encounter (Signed)
Pt was notified and scheduled for a My Chart video visit this Friday 01/22/20.  Baldo Ash FYI-patient does have a thermometer, she did state she didn't have a BP machine or pulse Ox.

## 2020-01-20 NOTE — Telephone Encounter (Signed)
Charlotte please advise.  Pt tested postive yesterday 01/19/20. Pt's BP was 145/103 at the ED. Pt states she feels terrible with sore throat, cough an fever. Pt asking if theres anything you recommend OTC or prescription that would help her symptoms.

## 2020-01-22 ENCOUNTER — Telehealth (INDEPENDENT_AMBULATORY_CARE_PROVIDER_SITE_OTHER): Payer: BC Managed Care – PPO | Admitting: Nurse Practitioner

## 2020-01-22 ENCOUNTER — Emergency Department (HOSPITAL_COMMUNITY): Payer: BC Managed Care – PPO

## 2020-01-22 ENCOUNTER — Emergency Department (HOSPITAL_COMMUNITY)
Admission: EM | Admit: 2020-01-22 | Discharge: 2020-01-22 | Disposition: A | Discharge status: Home / Self Care | Payer: BC Managed Care – PPO | Attending: Emergency Medicine | Admitting: Emergency Medicine

## 2020-01-22 ENCOUNTER — Other Ambulatory Visit: Payer: Self-pay

## 2020-01-22 ENCOUNTER — Encounter: Payer: Self-pay | Admitting: Nurse Practitioner

## 2020-01-22 ENCOUNTER — Encounter (HOSPITAL_COMMUNITY): Payer: Self-pay | Admitting: Emergency Medicine

## 2020-01-22 VITALS — Ht 62.5 in | Wt 191.0 lb

## 2020-01-22 DIAGNOSIS — M791 Myalgia, unspecified site: Secondary | ICD-10-CM | POA: Diagnosis not present

## 2020-01-22 DIAGNOSIS — J40 Bronchitis, not specified as acute or chronic: Secondary | ICD-10-CM | POA: Diagnosis not present

## 2020-01-22 DIAGNOSIS — R0602 Shortness of breath: Secondary | ICD-10-CM | POA: Diagnosis not present

## 2020-01-22 DIAGNOSIS — E860 Dehydration: Secondary | ICD-10-CM

## 2020-01-22 DIAGNOSIS — Z79899 Other long term (current) drug therapy: Secondary | ICD-10-CM | POA: Diagnosis not present

## 2020-01-22 DIAGNOSIS — U071 COVID-19: Secondary | ICD-10-CM

## 2020-01-22 DIAGNOSIS — R531 Weakness: Secondary | ICD-10-CM | POA: Diagnosis not present

## 2020-01-22 DIAGNOSIS — R000 Tachycardia, unspecified: Secondary | ICD-10-CM | POA: Diagnosis not present

## 2020-01-22 LAB — CBC WITH DIFFERENTIAL/PLATELET
Abs Immature Granulocytes: 0.01 10*3/uL (ref 0.00–0.07)
Basophils Absolute: 0 10*3/uL (ref 0.0–0.1)
Basophils Relative: 1 %
Eosinophils Absolute: 0 10*3/uL (ref 0.0–0.5)
Eosinophils Relative: 0 %
HCT: 44.7 % (ref 36.0–46.0)
Hemoglobin: 14.6 g/dL (ref 12.0–15.0)
Immature Granulocytes: 0 %
Lymphocytes Relative: 29 %
Lymphs Abs: 1.1 10*3/uL (ref 0.7–4.0)
MCH: 27.3 pg (ref 26.0–34.0)
MCHC: 32.7 g/dL (ref 30.0–36.0)
MCV: 83.6 fL (ref 80.0–100.0)
Monocytes Absolute: 0.3 10*3/uL (ref 0.1–1.0)
Monocytes Relative: 9 %
Neutro Abs: 2.2 10*3/uL (ref 1.7–7.7)
Neutrophils Relative %: 61 %
Platelets: 205 10*3/uL (ref 150–400)
RBC: 5.35 MIL/uL — ABNORMAL HIGH (ref 3.87–5.11)
RDW: 20 % — ABNORMAL HIGH (ref 11.5–15.5)
WBC: 3.6 10*3/uL — ABNORMAL LOW (ref 4.0–10.5)
nRBC: 0 % (ref 0.0–0.2)

## 2020-01-22 LAB — BASIC METABOLIC PANEL
Anion gap: 12 (ref 5–15)
BUN: 8 mg/dL (ref 6–20)
CO2: 24 mmol/L (ref 22–32)
Calcium: 8.5 mg/dL — ABNORMAL LOW (ref 8.9–10.3)
Chloride: 99 mmol/L (ref 98–111)
Creatinine, Ser: 0.93 mg/dL (ref 0.44–1.00)
GFR calc Af Amer: >60 mL/min (ref >60)
GFR calc non Af Amer: >60 mL/min (ref >60)
Glucose, Bld: 85 mg/dL (ref 70–99)
Potassium: 3.1 mmol/L — ABNORMAL LOW (ref 3.5–5.1)
Sodium: 135 mmol/L (ref 135–145)

## 2020-01-22 MED ORDER — ALBUTEROL SULFATE HFA 108 (90 BASE) MCG/ACT IN AERS
2.0000 | INHALATION_SPRAY | Freq: Four times a day (QID) | RESPIRATORY_TRACT | 0 refills | Status: DC | PRN
Start: 2020-01-22 — End: 2024-02-18

## 2020-01-22 MED ORDER — ACETAMINOPHEN 325 MG PO TABS
650.0000 mg | ORAL_TABLET | Freq: Once | ORAL | Status: AC
  Administered 2020-01-22: 650 mg via ORAL
  Filled 2020-01-22: qty 2

## 2020-01-22 MED ORDER — POTASSIUM CHLORIDE CRYS ER 20 MEQ PO TBCR
40.0000 meq | EXTENDED_RELEASE_TABLET | Freq: Once | ORAL | Status: AC
  Administered 2020-01-22: 40 meq via ORAL
  Filled 2020-01-22: qty 2

## 2020-01-22 MED ORDER — ACETAMINOPHEN 325 MG PO TABS: 650.0000 mg | ORAL_TABLET | Freq: Once | ORAL | Status: DC

## 2020-01-22 NOTE — Progress Notes (Signed)
Virtual Visit via Video Note  I connected with@ on 01/22/20 at  9:30 AM EDT by a video enabled telemedicine application and verified that I am speaking with the correct person using two identifiers.  Location: Patient:Home Provider: Office Participants: patient and provider  I discussed the limitations of evaluation and management by telemedicine and the availability of in person appointments. I also discussed with the patient that there may be a patient responsible charge related to this service. The patient expressed understanding and agreed to proceed.  CC:pt dx covid 01/19/20-pt complains of coughing, chills and a headache that won't go away  History of Present Illness: Normal CXR on 01/19/2020 URI  This is a new problem. The current episode started in the past 7 days. The problem has been gradually worsening. Maximum temperature: no thermometer to check. Associated symptoms include chest pain, congestion, coughing, diarrhea, headaches, nausea and sinus pain. Pertinent negatives include no abdominal pain, dysuria, ear pain, joint pain, joint swelling, neck pain, plugged ear sensation, rash, vomiting or wheezing. She has tried acetaminophen for the symptoms. The treatment provided no relief.   Observations/Objective: Physical Exam  Constitutional: She is oriented to person, place, and time.  Appear lethargic during video call. She unable to provide any vital signs  Pulmonary/Chest: Effort normal.  Neurological: She is alert and oriented to person, place, and time.    Assessment and Plan: Erika David was seen today for follow-up.  Diagnoses and all orders for this visit:  COVID-19 virus infection  Dehydration   Follow Up Instructions: Due to persistent diarrhea, poor oral hydration, chest pain and SOB; I advised Erika David to go to ED for adequate evaluation and treatment. She verbalized understanding and agreed to go.   I discussed the assessment and treatment plan with the  patient. The patient was provided an opportunity to ask questions and all were answered. The patient agreed with the plan and demonstrated an understanding of the instructions.   The patient was advised to call back or seek an in-person evaluation if the symptoms worsen or if the condition fails to improve as anticipated.   Wilfred Lacy, NP

## 2020-01-22 NOTE — ED Triage Notes (Signed)
Patient reports Covid+ on 5/11. Reports worsening weakness. States PCP sent for further evaluation.

## 2020-01-22 NOTE — Patient Instructions (Signed)
Due to persistent diarrhea, poor oral hydration, chest pain and SOB; I advised Erika David to go to ED for adequate evaluation and treatment. She verbalized understanding and agreed to go.

## 2020-01-22 NOTE — ED Provider Notes (Signed)
[facility] DEPT Provider Note   CSN: [id number] Arrival date & time: 01/22/20  1149     History Chief Complaint  Patient presents with  . Covid+    Erika David is a 44 y.o. female.  HPI HPI Comments: Erika David is a 44 y.o. female who presents to the Emergency Department with multiple complaints.  Patient was diagnosed with COVID-19 3 days ago.  She reports body aches, headache, diarrhea, intermittent nausea without vomiting, sore throat, productive cough, diffuse chest and abdominal pain when coughing, congestion, rhinorrhea, intermittent diffuse headaches.  She has been taking Tylenol and Mucinex with minimal relief.  Patient was diagnosed with colitis in February of this year and was placed on budesonide and dicyclomine which she confirmed she has been taking.  She states she has chronic diarrhea from this but feels that it has acutely worsened since she started having COVID-19.  She denies urinary changes, syncope.      Past Medical History:  Diagnosis Date  . Abnormal Pap smear     LEEP?  LAST PAP 05/2009  . Anemia 01/04/2012  . Chlamydia   . H/O candidiasis   . H/O dysmenorrhea 03/13/02  . H/O gonorrhea    treated  . H/O menorrhagia 07/09/02  . Infection 2006   GONORHRREA  . Infection 2006   CHLAMYDIA  . Infection    FREQ YEAST  . Pelvic injury AS TEEN   SPORTS RELATED  . Pelvic pain 07/11/10  . Trichomonas   . Yeast infection     Patient Active Problem List   Diagnosis Date Noted  . Iron deficiency anemia due to chronic blood loss 11/30/2019  . Lymphocytic colitis 11/13/2019  . Diarrhea   . Abnormal CT scan, colon   . Generalized weakness 11/09/2019  . Pancolitis ([place]) 11/08/2019  . Vaginal discharge 05/14/2016  . Well adult on routine health check 05/14/2016  . S/P cesarean section 04/12/2012  . Sterilization 01/04/2012  . Overweight(278.02) 01/04/2012  . Anemia 01/04/2012  . Seasonal allergies 01/04/2012    Past  Surgical History:  Procedure Laterality Date  . BIOPSY  11/12/2019   Procedure: BIOPSY;  Surgeon: Ladene Artist, MD;  Location: [facility];  Service: Endoscopy;;  . CESAREAN SECTION  04/11/2012   Procedure: CESAREAN SECTION;  Surgeon: Delice Lesch, MD;  Location: [facility];  Service: Gynecology;  Laterality: N/A;  twins  . COLONOSCOPY WITH PROPOFOL N/A 11/12/2019   Procedure: COLONOSCOPY WITH PROPOFOL;  Surgeon: Ladene Artist, MD;  Location: [facility];  Service: Endoscopy;  Laterality: N/A;  . GYNECOLOGIC CRYOSURGERY    . TUBAL LIGATION    . WISDOM TOOTH EXTRACTION  2007     OB History    Gravida  6   Para  4   Term  4   Preterm      AB  2   Living  5     SAB  1   TAB  1   Ectopic      Multiple  1   Live Births  5           Family History  Problem Relation Age of Onset  . Hypertension Mother   . Cancer Mother        BREAST  . Thyroid disease Mother   . Diabetes Father   . Hypertension Sister   . Lupus Maternal Aunt   . Liver disease Sister   . Asthma Cousin   . Hypertension Maternal  Grandmother   . Diabetes Maternal Grandmother   . Heart disease Maternal Grandfather   . Stroke Maternal Grandfather   . Alcohol abuse Maternal Aunt   . Drug abuse Maternal Aunt   . Anesthesia problems Neg Hx     Social History   Tobacco Use  . Smoking status: Never Smoker  . Smokeless tobacco: Never Used  Substance Use Topics  . Alcohol use: Yes    Comment: occasional alcohol  . Drug use: No    Home Medications Prior to Admission medications   Medication Sig Start Date End Date Taking? Authorizing Provider  budesonide (ENTOCORT EC) 3 MG 24 hr capsule Take 1 capsule (3 mg total) by mouth daily. 01/04/20   Ladene Artist, MD  cetirizine (ZYRTEC) 10 MG tablet Take 10 mg by mouth daily.    [provider]  dicyclomine (BENTYL) 10 MG capsule Take 1 capsule (10 mg total) by mouth 4 (four) times daily -  before meals and at bedtime. Patient not  taking: Reported on 01/22/2020 11/15/19 01/22/20  Kayleen Memos, DO  loperamide (IMODIUM) 2 MG capsule Take 1 capsule (2 mg total) by mouth 3 (three) times daily between meals as needed for diarrhea or loose stools. 11/15/19   Kayleen Memos, DO    Allergies    Dust mite extract, Kiwi extract, and Pollen extract  Review of Systems   Review of Systems  Constitutional: Positive for activity change, chills, fatigue and fever.  HENT: Positive for congestion, postnasal drip, rhinorrhea, sneezing and sore throat. Negative for ear discharge, ear pain, hearing loss and trouble swallowing.   Respiratory: Positive for shortness of breath.   Cardiovascular: Positive for chest pain.  Gastrointestinal: Positive for abdominal pain, diarrhea and nausea. Negative for constipation and vomiting.  Genitourinary: Negative for dysuria and hematuria.  Musculoskeletal: Positive for myalgias.  Neurological: Positive for weakness and headaches. Negative for syncope.   Physical Exam Updated Vital Signs BP (!) 135/99   Pulse (!) 114   Temp 100.3 F (37.9 C)   Resp (!) 22   LMP 01/15/2020   SpO2 99%   Physical Exam Vitals and nursing note reviewed.  Constitutional:      General: She is in acute distress.     Appearance: Normal appearance. She is normal weight. She is not ill-appearing, toxic-appearing or diaphoretic.  HENT:     Head: Normocephalic and atraumatic.     Right Ear: Ear canal and external ear normal. A middle ear effusion is present. There is no impacted cerumen. Tympanic membrane is not injected, perforated, erythematous or bulging.     Left Ear: Ear canal and external ear normal. A middle ear effusion is present. There is no impacted cerumen. Tympanic membrane is not injected, perforated, erythematous or bulging.     Nose: Nose normal.     Mouth/Throat:     Mouth: Mucous membranes are dry.     Pharynx: Oropharynx is clear. Posterior oropharyngeal erythema present. No oropharyngeal exudate.    Eyes:     General: No scleral icterus.       Right eye: No discharge.        Left eye: No discharge.     Extraocular Movements: Extraocular movements intact.     Conjunctiva/sclera: Conjunctivae normal.     Pupils: Pupils are equal, round, and reactive to light.  Cardiovascular:     Rate and Rhythm: Normal rate and regular rhythm.     Pulses: Normal pulses.     Heart  sounds: Normal heart sounds. No murmur. No friction rub. No gallop.   Pulmonary:     Effort: Pulmonary effort is normal. No respiratory distress.     Breath sounds: Normal breath sounds. No stridor. No wheezing, rhonchi or rales.  Abdominal:     General: Abdomen is flat.     Palpations: Abdomen is soft.     Tenderness: There is no abdominal tenderness.  Musculoskeletal:        General: Normal range of motion.     Cervical back: Normal range of motion and neck supple. No tenderness.  Skin:    General: Skin is warm and dry.  Neurological:     General: No focal deficit present.     Mental Status: She is alert and oriented to person, place, and time.  Psychiatric:        Mood and Affect: Mood normal.        Behavior: Behavior normal.    ED Results / Procedures / Treatments   Labs (all labs ordered are listed, but only abnormal results are displayed) Labs Reviewed  BASIC METABOLIC PANEL - Abnormal; Notable for the following components:      Result Value   Potassium 3.1 (*)    Calcium 8.5 (*)    All other components within normal limits  CBC WITH DIFFERENTIAL/PLATELET - Abnormal; Notable for the following components:   WBC 3.6 (*)    RBC 5.35 (*)    RDW 20.0 (*)    All other components within normal limits    EKG None  Radiology DG Chest 2 View  Result Date: 01/22/2020 CLINICAL DATA:  Worsening weakness. COVID-19 positive. EXAM: CHEST - 2 VIEW COMPARISON:  01/19/2020 FINDINGS: Normal sized heart. Clear lungs. Stable mild peribronchial thickening. Unremarkable bones. IMPRESSION: Stable mild bronchitic  changes. No acute abnormality. Electronically Signed   By: Claudie Revering M.D.   On: 01/22/2020 13:13    Procedures Procedures   Medications Ordered in ED Medications  acetaminophen (TYLENOL) tablet 650 mg (650 mg Oral Given 01/22/20 1650)  potassium chloride SA (KLOR-CON) CR tablet 40 mEq (40 mEq Oral Given 01/22/20 1906)    ED Course  I have reviewed the triage vital signs and the nursing notes.  Pertinent labs & imaging results that were available during my care of the patient were reviewed by me and considered in my medical decision making (see chart for details).    MDM Rules/Calculators/A&P                      4:27 PM patient is a 44 year old female that presents with multiple complaints consistent with COVID-19, which she was diagnosed with 3 days ago.  Chest x-ray was obtained in triage showing stable mild bronchitic changes but no other abnormalities.  Patient is mildly tachycardic but otherwise physical exam is reassuring.  Lungs are clear to auscultation bilaterally.  Will obtain basic labs.  Tylenol for fever and pain.  Will reassess.  7:48 PM basic labs show a mild leukopenia at 3.6.  Hypokalemia 3.1.  Will replete with Klor-Con.  ECG is reassuring.  I discussed this with the patient.  Will discharge with a prescription for an albuterol inhaler for as needed use in regards to her shortness of breath.  Her vital signs are stable.  Her tachycardia is improved to 100-105.  She has been saturating above 97% throughout her stay.  She is no longer tachypneic.  Her blood pressure is mildly elevated at 138/99.  Blood  pressure was elevated at her prior visit and she was instructed to go follow-up with her PCP at this time.  I recommended the same today.  She appears stable and safe for discharge.  She was given strict return precautions.  She understands she is to return to the emergency department any new or worsening symptoms.  Her questions were answered and she was amicable at the time of  discharge.  Patient discharged to home/self care.  Condition at discharge: Stable  Note: Portions of this report may have been transcribed using voice recognition software. Every effort was made to ensure accuracy; however, inadvertent computerized transcription errors may be present.    Final Clinical Impression(s) / ED Diagnoses Final diagnoses:  [id number]   Rx / DC Orders ED Discharge Orders         Ordered    albuterol (VENTOLIN HFA) 108 (90 Base) MCG/ACT inhaler  Every 6 hours PRN     01/22/20 1958           Rayna Sexton, PA-C 01/22/20 2003    Fredia Sorrow, MD 01/23/20 [phone]

## 2020-01-22 NOTE — Discharge Instructions (Signed)
Per our discussion, I prescribed you an albuterol inhaler which you can begin using as needed for shortness of breath.  I would continue taking both Mucinex as well as Tylenol for management of your fever.  Please make sure you are staying adequately hydrated.  You can supplement this with electrolyte replacement beverages like Gatorade or Powerade.  Please continue to quarantine until your symptoms have subsided for at least 10 days from the onset of your symptoms.  If your symptoms worsen, please do not hesitate to return to the emergency department.  It was a pleasure to meet you.

## 2020-01-27 ENCOUNTER — Other Ambulatory Visit: Payer: Self-pay

## 2020-01-27 ENCOUNTER — Emergency Department (HOSPITAL_COMMUNITY): Payer: BC Managed Care – PPO

## 2020-01-27 ENCOUNTER — Inpatient Hospital Stay (HOSPITAL_COMMUNITY)
Admission: EM | Admit: 2020-01-27 | Discharge: 2020-01-31 | DRG: 177 | Disposition: A | Discharge status: Home / Self Care | Payer: BC Managed Care – PPO | Attending: Internal Medicine | Admitting: Internal Medicine

## 2020-01-27 DIAGNOSIS — Z862 Personal history of diseases of the blood and blood-forming organs and certain disorders involving the immune mechanism: Secondary | ICD-10-CM | POA: Diagnosis not present

## 2020-01-27 DIAGNOSIS — Z79899 Other long term (current) drug therapy: Secondary | ICD-10-CM | POA: Diagnosis not present

## 2020-01-27 DIAGNOSIS — Z803 Family history of malignant neoplasm of breast: Secondary | ICD-10-CM | POA: Diagnosis not present

## 2020-01-27 DIAGNOSIS — D500 Iron deficiency anemia secondary to blood loss (chronic): Secondary | ICD-10-CM | POA: Diagnosis present

## 2020-01-27 DIAGNOSIS — E669 Obesity, unspecified: Secondary | ICD-10-CM | POA: Diagnosis present

## 2020-01-27 DIAGNOSIS — K52832 Lymphocytic colitis: Secondary | ICD-10-CM | POA: Diagnosis not present

## 2020-01-27 DIAGNOSIS — R109 Unspecified abdominal pain: Secondary | ICD-10-CM | POA: Diagnosis not present

## 2020-01-27 DIAGNOSIS — Z8349 Family history of other endocrine, nutritional and metabolic diseases: Secondary | ICD-10-CM | POA: Diagnosis not present

## 2020-01-27 DIAGNOSIS — R05 Cough: Secondary | ICD-10-CM | POA: Diagnosis not present

## 2020-01-27 DIAGNOSIS — Z833 Family history of diabetes mellitus: Secondary | ICD-10-CM

## 2020-01-27 DIAGNOSIS — J9600 Acute respiratory failure, unspecified whether with hypoxia or hypercapnia: Secondary | ICD-10-CM

## 2020-01-27 DIAGNOSIS — Z6834 Body mass index (BMI) 34.0-34.9, adult: Secondary | ICD-10-CM

## 2020-01-27 DIAGNOSIS — Z8249 Family history of ischemic heart disease and other diseases of the circulatory system: Secondary | ICD-10-CM

## 2020-01-27 DIAGNOSIS — Z823 Family history of stroke: Secondary | ICD-10-CM | POA: Diagnosis not present

## 2020-01-27 DIAGNOSIS — R112 Nausea with vomiting, unspecified: Secondary | ICD-10-CM | POA: Diagnosis not present

## 2020-01-27 DIAGNOSIS — R0602 Shortness of breath: Secondary | ICD-10-CM | POA: Diagnosis not present

## 2020-01-27 DIAGNOSIS — J9601 Acute respiratory failure with hypoxia: Secondary | ICD-10-CM | POA: Diagnosis present

## 2020-01-27 DIAGNOSIS — U071 COVID-19: Secondary | ICD-10-CM | POA: Diagnosis not present

## 2020-01-27 DIAGNOSIS — J1282 Pneumonia due to coronavirus disease 2019: Secondary | ICD-10-CM | POA: Diagnosis present

## 2020-01-27 DIAGNOSIS — R000 Tachycardia, unspecified: Secondary | ICD-10-CM | POA: Diagnosis not present

## 2020-01-27 DIAGNOSIS — Z825 Family history of asthma and other chronic lower respiratory diseases: Secondary | ICD-10-CM | POA: Diagnosis not present

## 2020-01-27 DIAGNOSIS — E876 Hypokalemia: Secondary | ICD-10-CM | POA: Diagnosis not present

## 2020-01-27 DIAGNOSIS — E663 Overweight: Secondary | ICD-10-CM | POA: Diagnosis present

## 2020-01-27 HISTORY — DX: Acute respiratory failure, unspecified whether with hypoxia or hypercapnia: J96.00

## 2020-01-27 LAB — CREATININE, SERUM
Creatinine, Ser: 0.8 mg/dL (ref 0.44–1.00)
GFR calc Af Amer: >60 mL/min (ref >60)
GFR calc non Af Amer: >60 mL/min (ref >60)

## 2020-01-27 LAB — CBC WITH DIFFERENTIAL/PLATELET
Abs Immature Granulocytes: 0.02 10*3/uL (ref 0.00–0.07)
Basophils Absolute: 0 10*3/uL (ref 0.0–0.1)
Basophils Relative: 0 %
Eosinophils Absolute: 0 10*3/uL (ref 0.0–0.5)
Eosinophils Relative: 0 %
HCT: 42.5 % (ref 36.0–46.0)
Hemoglobin: 14.3 g/dL (ref 12.0–15.0)
Immature Granulocytes: 0 %
Lymphocytes Relative: 19 %
Lymphs Abs: 0.9 10*3/uL (ref 0.7–4.0)
MCH: 27.7 pg (ref 26.0–34.0)
MCHC: 33.6 g/dL (ref 30.0–36.0)
MCV: 82.4 fL (ref 80.0–100.0)
Monocytes Absolute: 0.3 10*3/uL (ref 0.1–1.0)
Monocytes Relative: 6 %
Neutro Abs: 3.5 10*3/uL (ref 1.7–7.7)
Neutrophils Relative %: 75 %
Platelets: 209 10*3/uL (ref 150–400)
RBC: 5.16 MIL/uL — ABNORMAL HIGH (ref 3.87–5.11)
RDW: 18.7 % — ABNORMAL HIGH (ref 11.5–15.5)
WBC: 4.7 10*3/uL (ref 4.0–10.5)
nRBC: 0 % (ref 0.0–0.2)

## 2020-01-27 LAB — COMPREHENSIVE METABOLIC PANEL
ALT: 26 U/L (ref 0–44)
AST: 33 U/L (ref 15–41)
Albumin: 3.8 g/dL (ref 3.5–5.0)
Alkaline Phosphatase: 72 U/L (ref 38–126)
Anion gap: 11 (ref 5–15)
BUN: <5 mg/dL — ABNORMAL LOW (ref 6–20)
CO2: 25 mmol/L (ref 22–32)
Calcium: 8.8 mg/dL — ABNORMAL LOW (ref 8.9–10.3)
Chloride: 101 mmol/L (ref 98–111)
Creatinine, Ser: 0.81 mg/dL (ref 0.44–1.00)
GFR calc Af Amer: >60 mL/min (ref >60)
GFR calc non Af Amer: >60 mL/min (ref >60)
Glucose, Bld: 111 mg/dL — ABNORMAL HIGH (ref 70–99)
Potassium: 3.1 mmol/L — ABNORMAL LOW (ref 3.5–5.1)
Sodium: 137 mmol/L (ref 135–145)
Total Bilirubin: 0.4 mg/dL (ref 0.3–1.2)
Total Protein: 7.7 g/dL (ref 6.5–8.1)

## 2020-01-27 LAB — LACTATE DEHYDROGENASE: LDH: 259 U/L — ABNORMAL HIGH (ref 98–192)

## 2020-01-27 LAB — URINALYSIS, ROUTINE W REFLEX MICROSCOPIC
Bilirubin Urine: NEGATIVE
Glucose, UA: NEGATIVE mg/dL
Hgb urine dipstick: NEGATIVE
Ketones, ur: 5 mg/dL — AB
Nitrite: NEGATIVE
Protein, ur: 30 mg/dL — AB
Specific Gravity, Urine: 1.027 (ref 1.005–1.030)
pH: 6 (ref 5.0–8.0)

## 2020-01-27 LAB — FIBRINOGEN: Fibrinogen: 464 mg/dL (ref 210–475)

## 2020-01-27 LAB — TROPONIN I (HIGH SENSITIVITY)
Troponin I (High Sensitivity): 4 ng/L (ref <18)
Troponin I (High Sensitivity): 3 ng/L (ref <18)

## 2020-01-27 LAB — CBC
HCT: 40.9 % (ref 36.0–46.0)
Hemoglobin: 13.4 g/dL (ref 12.0–15.0)
MCH: 27.3 pg (ref 26.0–34.0)
MCHC: 32.8 g/dL (ref 30.0–36.0)
MCV: 83.3 fL (ref 80.0–100.0)
Platelets: 186 10*3/uL (ref 150–400)
RBC: 4.91 MIL/uL (ref 3.87–5.11)
RDW: 18.8 % — ABNORMAL HIGH (ref 11.5–15.5)
WBC: 5.3 10*3/uL (ref 4.0–10.5)
nRBC: 0 % (ref 0.0–0.2)

## 2020-01-27 LAB — D-DIMER, QUANTITATIVE: D-Dimer, Quant: 0.38 ug/mL-FEU (ref 0.00–0.50)

## 2020-01-27 LAB — LACTIC ACID, PLASMA
Lactic Acid, Venous: 1.1 mmol/L (ref 0.5–1.9)
Lactic Acid, Venous: 1.3 mmol/L (ref 0.5–1.9)

## 2020-01-27 LAB — TRIGLYCERIDES: Triglycerides: 143 mg/dL (ref <150)

## 2020-01-27 LAB — C-REACTIVE PROTEIN: CRP: 8.8 mg/dL — ABNORMAL HIGH (ref <1.0)

## 2020-01-27 LAB — I-STAT BETA HCG BLOOD, ED (MC, WL, AP ONLY): I-stat hCG, quantitative: <5 m[IU]/mL (ref <5)

## 2020-01-27 LAB — PROCALCITONIN: Procalcitonin: <0.1 ng/mL

## 2020-01-27 LAB — FERRITIN: Ferritin: 378 ng/mL — ABNORMAL HIGH (ref 11–307)

## 2020-01-27 LAB — ABO/RH: ABO/RH(D): B POS

## 2020-01-27 MED ORDER — ASCORBIC ACID 500 MG PO TABS
500.0000 mg | ORAL_TABLET | Freq: Every day | ORAL | Status: DC
  Administered 2020-01-27 – 2020-01-31 (×5): 500 mg via ORAL
  Filled 2020-01-27 (×5): qty 1

## 2020-01-27 MED ORDER — POTASSIUM CHLORIDE 10 MEQ/100ML IV SOLN
10.0000 meq | INTRAVENOUS | Status: AC
  Administered 2020-01-27 (×2): 10 meq via INTRAVENOUS
  Filled 2020-01-27 (×2): qty 100

## 2020-01-27 MED ORDER — DEXAMETHASONE SODIUM PHOSPHATE 10 MG/ML IJ SOLN
6.0000 mg | INTRAMUSCULAR | Status: DC
  Administered 2020-01-28 – 2020-01-31 (×4): 6 mg via INTRAVENOUS
  Filled 2020-01-27 (×4): qty 1

## 2020-01-27 MED ORDER — SODIUM CHLORIDE 0.9 % IV BOLUS
1000.0000 mL | Freq: Once | INTRAVENOUS | Status: AC
  Administered 2020-01-27: 1000 mL via INTRAVENOUS

## 2020-01-27 MED ORDER — SODIUM CHLORIDE 0.9 % IV SOLN: 200.0000 mg | Freq: Once | INTRAVENOUS | Status: DC

## 2020-01-27 MED ORDER — ONDANSETRON HCL 4 MG/2ML IJ SOLN: 4.0000 mg | Freq: Four times a day (QID) | INTRAMUSCULAR | Status: DC | PRN

## 2020-01-27 MED ORDER — TOCILIZUMAB 400 MG/20ML IV SOLN
8.0000 mg/kg | Freq: Once | INTRAVENOUS | Status: AC
  Administered 2020-01-27: 692 mg via INTRAVENOUS
  Filled 2020-01-27: qty 34.6

## 2020-01-27 MED ORDER — ACETAMINOPHEN 325 MG PO TABS: 650.0000 mg | ORAL_TABLET | Freq: Four times a day (QID) | ORAL | Status: DC | PRN

## 2020-01-27 MED ORDER — SODIUM CHLORIDE 0.9 % IV SOLN
200.0000 mg | Freq: Once | INTRAVENOUS | Status: AC
  Administered 2020-01-27: 200 mg via INTRAVENOUS
  Filled 2020-01-27: qty 40

## 2020-01-27 MED ORDER — DEXAMETHASONE SODIUM PHOSPHATE 10 MG/ML IJ SOLN
8.0000 mg | Freq: Once | INTRAMUSCULAR | Status: AC
  Administered 2020-01-27: 8 mg via INTRAVENOUS
  Filled 2020-01-27: qty 1

## 2020-01-27 MED ORDER — SODIUM CHLORIDE 0.9% FLUSH
3.0000 mL | Freq: Once | INTRAVENOUS | Status: AC
  Administered 2020-01-27: 3 mL via INTRAVENOUS

## 2020-01-27 MED ORDER — ENOXAPARIN SODIUM 40 MG/0.4ML ~~LOC~~ SOLN
40.0000 mg | SUBCUTANEOUS | Status: DC
  Administered 2020-01-27 – 2020-01-30 (×4): 40 mg via SUBCUTANEOUS
  Filled 2020-01-27 (×4): qty 0.4

## 2020-01-27 MED ORDER — IOHEXOL 350 MG/ML SOLN
100.0000 mL | Freq: Once | INTRAVENOUS | Status: AC | PRN
  Administered 2020-01-27: 100 mL via INTRAVENOUS

## 2020-01-27 MED ORDER — IPRATROPIUM-ALBUTEROL 20-100 MCG/ACT IN AERS
1.0000 | INHALATION_SPRAY | Freq: Four times a day (QID) | RESPIRATORY_TRACT | Status: DC
  Administered 2020-01-28 – 2020-01-30 (×10): 1 via RESPIRATORY_TRACT
  Filled 2020-01-27 (×2): qty 4

## 2020-01-27 MED ORDER — GUAIFENESIN-DM 100-10 MG/5ML PO SYRP
10.0000 mL | ORAL_SOLUTION | ORAL | Status: DC | PRN
  Administered 2020-01-28: 10 mL via ORAL

## 2020-01-27 MED ORDER — SODIUM CHLORIDE 0.9 % IV SOLN
100.0000 mg | Freq: Every day | INTRAVENOUS | Status: AC
  Administered 2020-01-28 – 2020-01-31 (×4): 100 mg via INTRAVENOUS
  Filled 2020-01-27 (×4): qty 20

## 2020-01-27 MED ORDER — ZINC SULFATE 220 (50 ZN) MG PO CAPS
220.0000 mg | ORAL_CAPSULE | Freq: Every day | ORAL | Status: DC
  Administered 2020-01-27 – 2020-01-31 (×5): 220 mg via ORAL
  Filled 2020-01-27 (×5): qty 1

## 2020-01-27 MED ORDER — SODIUM CHLORIDE 0.9 % IV SOLN: 100.0000 mg | Freq: Every day | INTRAVENOUS | Status: DC

## 2020-01-27 MED ORDER — HYDROCOD POLST-CPM POLST ER 10-8 MG/5ML PO SUER: 5.0000 mL | Freq: Two times a day (BID) | ORAL | Status: DC | PRN

## 2020-01-27 MED ORDER — ONDANSETRON HCL 4 MG PO TABS
4.0000 mg | ORAL_TABLET | Freq: Four times a day (QID) | ORAL | Status: DC | PRN
  Administered 2020-01-31: 4 mg via ORAL
  Filled 2020-01-27: qty 1

## 2020-01-27 NOTE — H&P (Signed)
History and Physical   Erika David OVF:[id number] DOB: 10-29-1975 DOA: 01/27/2020  Referring MD/NP/PA: Dr. Laverta Baltimore  PCP: Erika Marek Charlene Brooke, NP   Outpatient Specialists: None  Patient coming from: Home  Chief Complaint: Shortness of breath  HPI: Erika David is a 44 y.o. female with medical history significant of colitis, who was recently diagnosed with COVID-19 infection about a week ago with the rest of her family.  She was never vaccinated.  Patient came in with worsening shortness of breath chest pain and cough.  Associated with fever and some chills.  Patient has also had significant diarrhea with nausea.  No vomiting.  No abdominal pain.  She came to the ER where she was seen and evaluated.  She was found to be tachypneic.  Also having significant infiltrates bilaterally.  Her markers were noticeably elevated.  She is being admitted with acute hypoxic respiratory failure secondary to COVID-19 pneumonia..  ED Course: Temperature is 99.5 blood pressure one 106/82 pulse 119 was 139 oxygen sats 96% on 2 L.  Potassium is 3.1, otherwise the rest of the chemistry appeared to be within normal.  CBC also appears to be within normal.  CRP of about 8, urinalysis negative.  LDH also elevated at 259.  Troponin of 4.  Ferritin 378 and lactic acid 1.3.  Chest x-ray showed multifocal pneumonia, CT angiogram of the chest shows no PE but multifocal pneumonia.  Patient admitted for treatment of COVID-19 pneumonia  Review of Systems: As per HPI otherwise 10 point review of systems negative.    Past Medical History:  Diagnosis Date  . Abnormal Pap smear     LEEP?  LAST PAP 05/2009  . Anemia 01/04/2012  . Chlamydia   . H/O candidiasis   . H/O dysmenorrhea 03/13/02  . H/O gonorrhea    treated  . H/O menorrhagia 07/09/02  . Infection 2006   GONORHRREA  . Infection 2006   CHLAMYDIA  . Infection    FREQ YEAST  . Pelvic injury AS TEEN   SPORTS RELATED  . Pelvic pain 07/11/10  . Trichomonas   . Yeast  infection     Past Surgical History:  Procedure Laterality Date  . BIOPSY  11/12/2019   Procedure: BIOPSY;  Surgeon: Erika Artist, MD;  Location: [facility];  Service: Endoscopy;;  . CESAREAN SECTION  04/11/2012   Procedure: CESAREAN SECTION;  Surgeon: Erika Lesch, MD;  Location: [facility];  Service: Gynecology;  Laterality: N/A;  twins  . COLONOSCOPY WITH PROPOFOL N/A 11/12/2019   Procedure: COLONOSCOPY WITH PROPOFOL;  Surgeon: Erika Artist, MD;  Location: [facility];  Service: Endoscopy;  Laterality: N/A;  . GYNECOLOGIC CRYOSURGERY    . TUBAL LIGATION    . WISDOM TOOTH EXTRACTION  2007     reports that she has never smoked. She has never used smokeless tobacco. She reports current alcohol use. She reports that she does not use drugs.  Allergies  Allergen Reactions  . Dust Mite Extract Anaphylaxis  . Kiwi Extract Itching and Swelling  . Pollen Extract Anaphylaxis    Tree pollen in environment    Family History  Problem Relation Age of Onset  . Hypertension Mother   . Cancer Mother        BREAST  . Thyroid disease Mother   . Diabetes Father   . Hypertension Sister   . Lupus Maternal Aunt   . Liver disease Sister   . Asthma Cousin   . Hypertension Maternal Grandmother   .  Diabetes Maternal Grandmother   . Heart disease Maternal Grandfather   . Stroke Maternal Grandfather   . Alcohol abuse Maternal Aunt   . Drug abuse Maternal Aunt   . Anesthesia problems Neg Hx      Prior to Admission medications   Medication Sig Start Date End Date Taking? Authorizing Provider  albuterol (VENTOLIN HFA) 108 (90 Base) MCG/ACT inhaler Inhale 2 puffs into the lungs every 6 (six) hours as needed for wheezing or shortness of breath. 01/22/20  Yes Erika Sexton, PA-C  budesonide (ENTOCORT EC) 3 MG 24 hr capsule Take 1 capsule (3 mg total) by mouth daily. 01/04/20  Yes Erika Artist, MD  cetirizine (ZYRTEC) 10 MG chewable tablet Chew 10 mg by mouth daily.   Yes [provider]  dicyclomine (BENTYL) 10 MG capsule Take 1 capsule (10 mg total) by mouth 4 (four) times daily -  before meals and at bedtime. 11/15/19 01/27/20 Yes Erika Memos, DO  loperamide (IMODIUM) 2 MG capsule Take 1 capsule (2 mg total) by mouth 3 (three) times daily between meals as needed for diarrhea or loose stools. Patient not taking: Reported on 01/22/2020 11/15/19   Erika Memos, DO    Physical Exam: Vitals:   01/27/20 1546 01/27/20 1600 01/27/20 1636 01/27/20 1719  BP:  124/88  129/86  Pulse:  (!) 105  (!) 117  Resp:  (!) 39  (!) 22  Temp:    99.1 F (37.3 C)  TempSrc:    Oral  SpO2:  98% 99% 99%  Weight: 86.6 kg     Height: 5' 1"  (1.549 m)         Constitutional: Anxious, no acute distress Vitals:   01/27/20 1546 01/27/20 1600 01/27/20 1636 01/27/20 1719  BP:  124/88  129/86  Pulse:  (!) 105  (!) 117  Resp:  (!) 39  (!) 22  Temp:    99.1 F (37.3 C)  TempSrc:    Oral  SpO2:  98% 99% 99%  Weight: 86.6 kg     Height: 5' 1"  (1.549 m)      Eyes: PERRL, lids and conjunctivae normal ENMT: Mucous membranes are moist. Posterior pharynx clear of any exudate or lesions.Normal dentition.  Neck: normal, supple, no masses, no thyromegaly Respiratory: Coarse breath sound bilaterally with rhonchi mild wheeze no crackles. Normal respiratory effort. No accessory muscle use.  Cardiovascular: Sinus tachycardia no murmurs / rubs / gallops. No extremity edema. 2+ pedal pulses. No carotid bruits.  Abdomen: no tenderness, no masses palpated. No hepatosplenomegaly. Bowel sounds positive.  Musculoskeletal: no clubbing / cyanosis. No joint deformity upper and lower extremities. Good ROM, no contractures. Normal muscle tone.  Skin: no rashes, lesions, ulcers. No induration Neurologic: CN 2-12 grossly intact. Sensation intact, DTR normal. Strength 5/5 in all 4.  Psychiatric: Normal judgment and insight. Alert and oriented x 3. Normal mood.     Labs on Admission: I have personally  reviewed following labs and imaging studies  CBC: Recent Labs  Lab 01/22/20 1622 01/27/20 1306  WBC 3.6* 4.7  NEUTROABS 2.2 3.5  HGB 14.6 14.3  HCT 44.7 42.5  MCV 83.6 82.4  PLT 205 428   Basic Metabolic Panel: Recent Labs  Lab 01/22/20 1622 01/27/20 1306  NA 135 137  K 3.1* 3.1*  CL 99 101  CO2 24 25  GLUCOSE 85 111*  BUN 8 <5*  CREATININE 0.93 0.81  CALCIUM 8.5* 8.8*   GFR: Estimated Creatinine Clearance: 90.4 mL/min (  by C-G formula based on SCr of 0.81 mg/dL). Liver Function Tests: Recent Labs  Lab 01/27/20 1306  AST 33  ALT 26  ALKPHOS 72  BILITOT 0.4  PROT 7.7  ALBUMIN 3.8   No results for input(s): LIPASE, AMYLASE in the last 168 hours. No results for input(s): AMMONIA in the last 168 hours. Coagulation Profile: No results for input(s): INR, PROTIME in the last 168 hours. Cardiac Enzymes: No results for input(s): CKTOTAL, CKMB, CKMBINDEX, TROPONINI in the last 168 hours. BNP (last 3 results) No results for input(s): PROBNP in the last 8760 hours. HbA1C: No results for input(s): HGBA1C in the last 72 hours. CBG: No results for input(s): GLUCAP in the last 168 hours. Lipid Profile: Recent Labs    01/27/20 1543  TRIG 143   Thyroid Function Tests: No results for input(s): TSH, T4TOTAL, FREET4, T3FREE, THYROIDAB in the last 72 hours. Anemia Panel: Recent Labs    01/27/20 1543  FERRITIN 378*   Urine analysis:    Component Value Date/Time   COLORURINE YELLOW 11/05/2019 [place] 11/05/2019 1546   LABSPEC >1.030 (H) 11/05/2019 1546   PHURINE 6.0 11/05/2019 1546   GLUCOSEU NEGATIVE 11/05/2019 1546   HGBUR NEGATIVE 11/05/2019 1546   BILIRUBINUR NEGATIVE 11/05/2019 1546   BILIRUBINUR neg 05/29/2013 1412   KETONESUR NEGATIVE 11/05/2019 1546   PROTEINUR NEGATIVE 11/05/2019 1546   UROBILINOGEN 0.2 05/29/2013 1412   UROBILINOGEN 0.2 04/08/2012 2330   NITRITE NEGATIVE 11/05/2019 1546   LEUKOCYTESUR NEGATIVE 11/05/2019 1546    Sepsis Labs: @LABRCNTIP (procalcitonin:4,lacticidven:4) ) Recent Results (from the past 240 hour(s))  SARS Coronavirus 2 by RT PCR (hospital order, performed in [place] hospital lab) Nasopharyngeal Nasopharyngeal Swab     Status: Abnormal   Collection Time: 01/19/20  6:31 AM   Specimen: Nasopharyngeal Swab  Result Value Ref Range Status   SARS Coronavirus 2 POSITIVE (A) NEGATIVE Final    Comment: RESULT CALLED TO, READ BACK BY AND VERIFIED WITH: CALLED TO A.HARTLEY AT [place] ON [id number] (NOTE) SARS-CoV-2 target nucleic acids are DETECTED SARS-CoV-2 RNA is generally detectable in upper respiratory specimens  during the acute phase of infection.  Positive results are indicative  of the presence of the identified virus, but do not rule out bacterial infection or co-infection with other pathogens not detected by the test.  Clinical correlation with patient history and  other diagnostic information is necessary to determine patient infection status.  The expected result is negative. Fact Sheet for Patients:   StrictlyIdeas.no  Fact Sheet for Healthcare Providers:   BankingDealers.co.za   This test is not yet approved or cleared by the [place] FDA and  has been authorized for detection and/or diagnosis of SARS-CoV-2 by FDA under an Emergency Use Authorization (EUA).  This EUA will remain in effect (meaning this  test can be used) for the duration of  the COVID-19 declaration under Section 564(b)(1) of the Act, 21 U.S.C. section 360-bbb-3(b)(1), unless the authorization is terminated or revoked sooner. Performed at [facility], [address] [place], [place] [address]      Radiological Exams on Admission: CT Angio Chest PE W and/or Wo Contrast  Result Date: 01/27/2020 CLINICAL DATA:  Worsening shortness of breath. EXAM: CT ANGIOGRAPHY CHEST WITH CONTRAST TECHNIQUE: Multidetector CT imaging of the chest was  performed using the standard protocol during bolus administration of intravenous contrast. Multiplanar CT image reconstructions and MIPs were obtained to evaluate the vascular anatomy. CONTRAST:  12m  OMNIPAQUE IOHEXOL 350 MG/ML SOLN COMPARISON:  None. FINDINGS: Cardiovascular: Satisfactory opacification of the pulmonary arteries to the segmental level. No evidence of pulmonary embolism. Normal heart size. No pericardial effusion. Mediastinum/Nodes: No enlarged mediastinal, hilar, or axillary lymph nodes. Thyroid gland, trachea, and esophagus demonstrate no significant findings. Lungs/Pleura: Moderate severity multifocal patchy infiltrates are seen throughout both lungs. There is no evidence of a pleural effusion or pneumothorax. Upper Abdomen: No acute abnormality. Musculoskeletal: No chest wall abnormality. No acute or significant osseous findings. Review of the MIP images confirms the above findings. IMPRESSION: 1. No evidence of pulmonary embolus. 2. Moderate severity multifocal patchy bilateral infiltrates. Electronically Signed   By: Virgina Norfolk M.D.   On: 01/27/2020 17:22   DG Chest Portable 1 View  Result Date: 01/27/2020 CLINICAL DATA:  Shortness of breath, COVID positive. Additional provided: Worsening shortness of breath onset Wednesday, diagnosed with COVID May 11th. Cough. EXAM: PORTABLE CHEST 1 VIEW COMPARISON:  Chest radiograph 01/22/2020 FINDINGS: Heart size within normal limits. Interstitial opacities within the mid to lower right lung field and left lung base. Findings are consistent with atypical/viral pneumonia given provided history of COVID positivity. No evidence of pleural effusion or pneumothorax. No acute bony abnormality identified. IMPRESSION: Interstitial opacities with the mid to lower right lung field and left lung base. Findings are consistent with atypical/viral pneumonia given the provided history of COVID positivity. Electronically Signed   By: Kellie Simmering DO   On:  01/27/2020 13:20    EKG: Independently reviewed.  Sinus tachycardia  Assessment/Plan Principal Problem:   Acute respiratory failure due to COVID-19 [facility]) Active Problems:   Overweight   Iron deficiency anemia due to chronic blood loss   Hypokalemia     #1  Acute respiratory failure secondary to COVID-19 pneumonia: Patient will be admitted.  Initiate remdesivir, Decadron, IV antibiotics, breathing treatments as well as supportive care.  Patient doing better on oxygen so we will continue with oxygenation.  #2 hypokalemia: Replete potassium.  #3 normocytic anemia: Stable.  Continue to monitor   DVT prophylaxis: Lovenox Code Status: Full code Family Communication: No family at bedside Disposition Plan: Home Consults called: None Admission status: Inpatient  Severity of Illness: The appropriate patient status for this patient is INPATIENT. Inpatient status is judged to be reasonable and necessary in order to provide the required intensity of service to ensure the patient's safety. The patient's presenting symptoms, physical exam findings, and initial radiographic and laboratory data in the context of their chronic comorbidities is felt to place them at high risk for further clinical deterioration. Furthermore, it is not anticipated that the patient will be medically stable for discharge from the hospital within 2 midnights of admission. The following factors support the patient status of inpatient.   " The patient's presenting symptoms include shortness of breath. " The worrisome physical exam findings include coarse breath sound bilaterally. " The initial radiographic and laboratory data are worrisome because of bilateral infiltrates noted. " The chronic co-morbidities include none.   * I certify that at the point of admission it is my clinical judgment that the patient will require inpatient hospital care spanning beyond 2 midnights from the point of admission due to high intensity  of service, high risk for further deterioration and high frequency of surveillance required.Barbette Merino MD [facility] Pager [phone]  If 7PM-7AM, please contact night-coverage [email or web address] Password [email or web address]  01/27/2020, 6:18 PM

## 2020-01-27 NOTE — ED Triage Notes (Signed)
Pt here with worsening shob onset Wednesday. Pt dx with covid on 5/11. Reports was seen and sent home with albuterol inhaler which pt reports is no longer working. Pt with labored breathing in triage.

## 2020-01-27 NOTE — ED Notes (Signed)
Off floor to CT 

## 2020-01-27 NOTE — ED Provider Notes (Signed)
[facility] EMERGENCY DEPARTMENT Provider Note   CSN: [id number] Arrival date & time: 01/27/20  1251    History Chief Complaint  Patient presents with  . Shortness of Breath    Erika David is a 44 y.o. female with past history significant for colitis, Covid infection who presents for evaluation of shortness of breath and chest pain.  This is patient's third visit since being diagnosed 1 week ago.  Patient states she has had continued shortness of breath however with sudden onset shortness of breath earlier today.  Has been using albuterol without relief.  Last use this morning.  Has persistent diarrhea however this is longstanding.  No associated abdominal pain.  No emesis.  Has had intermittent nausea.  Continues to have subjective fevers at home.  Patient also with myalgias.  Intermittent headaches.  No recent trauma.  No sudden onset thunderclap headaches.  Cough productive of clear sputum.  Chest pain located to central chest and left upper chest.  Does not radiate into jaw or back.  Is pleuritic in nature.  No unilateral leg swelling, redness or warmth.  States she cannot lay down due to coughing and shortness of breath.  No recent surgery or immobilization.  No prior history of PE or DVT.  Denies aggravating or alleviating factors  History obtained from patient and past medical records.  No interpreter was used.  HPI     Past Medical History:  Diagnosis Date  . Abnormal Pap smear     LEEP?  LAST PAP 05/2009  . Anemia 01/04/2012  . Chlamydia   . H/O candidiasis   . H/O dysmenorrhea 03/13/02  . H/O gonorrhea    treated  . H/O menorrhagia 07/09/02  . Infection 2006   GONORHRREA  . Infection 2006   CHLAMYDIA  . Infection    FREQ YEAST  . Pelvic injury AS TEEN   SPORTS RELATED  . Pelvic pain 07/11/10  . Trichomonas   . Yeast infection     Patient Active Problem List   Diagnosis Date Noted  . Iron deficiency anemia due to chronic blood loss 11/30/2019  .  Lymphocytic colitis 11/13/2019  . Diarrhea   . Abnormal CT scan, colon   . Generalized weakness 11/09/2019  . Pancolitis ([place]) 11/08/2019  . Vaginal discharge 05/14/2016  . Well adult on routine health check 05/14/2016  . S/P cesarean section 04/12/2012  . Sterilization 01/04/2012  . Overweight 01/04/2012  . Anemia 01/04/2012  . Seasonal allergies 01/04/2012    Past Surgical History:  Procedure Laterality Date  . BIOPSY  11/12/2019   Procedure: BIOPSY;  Surgeon: Ladene Artist, MD;  Location: [facility];  Service: Endoscopy;;  . CESAREAN SECTION  04/11/2012   Procedure: CESAREAN SECTION;  Surgeon: Delice Lesch, MD;  Location: [facility];  Service: Gynecology;  Laterality: N/A;  twins  . COLONOSCOPY WITH PROPOFOL N/A 11/12/2019   Procedure: COLONOSCOPY WITH PROPOFOL;  Surgeon: Ladene Artist, MD;  Location: [facility];  Service: Endoscopy;  Laterality: N/A;  . GYNECOLOGIC CRYOSURGERY    . TUBAL LIGATION    . WISDOM TOOTH EXTRACTION  2007     OB History    Gravida  6   Para  4   Term  4   Preterm      AB  2   Living  5     SAB  1   TAB  1   Ectopic      Multiple  1  Live Births  5           Family History  Problem Relation Age of Onset  . Hypertension Mother   . Cancer Mother        BREAST  . Thyroid disease Mother   . Diabetes Father   . Hypertension Sister   . Lupus Maternal Aunt   . Liver disease Sister   . Asthma Cousin   . Hypertension Maternal Grandmother   . Diabetes Maternal Grandmother   . Heart disease Maternal Grandfather   . Stroke Maternal Grandfather   . Alcohol abuse Maternal Aunt   . Drug abuse Maternal Aunt   . Anesthesia problems Neg Hx     Social History   Tobacco Use  . Smoking status: Never Smoker  . Smokeless tobacco: Never Used  Substance Use Topics  . Alcohol use: Yes    Comment: occasional alcohol  . Drug use: No    Home Medications Prior to Admission medications   Medication Sig Start Date End Date  Taking? Authorizing Provider  albuterol (VENTOLIN HFA) 108 (90 Base) MCG/ACT inhaler Inhale 2 puffs into the lungs every 6 (six) hours as needed for wheezing or shortness of breath. 01/22/20   Rayna Sexton, PA-C  budesonide (ENTOCORT EC) 3 MG 24 hr capsule Take 1 capsule (3 mg total) by mouth daily. Patient not taking: Reported on 01/22/2020 01/04/20   Ladene Artist, MD  dicyclomine (BENTYL) 10 MG capsule Take 1 capsule (10 mg total) by mouth 4 (four) times daily -  before meals and at bedtime. Patient not taking: Reported on 01/22/2020 11/15/19 01/22/20  Kayleen Memos, DO  guaiFENesin (ROBITUSSIN) 100 MG/5ML liquid Take 200 mg by mouth 3 (three) times daily as needed for cough or congestion.    [provider]  loperamide (IMODIUM) 2 MG capsule Take 1 capsule (2 mg total) by mouth 3 (three) times daily between meals as needed for diarrhea or loose stools. Patient not taking: Reported on 01/22/2020 11/15/19   Kayleen Memos, DO    Allergies    Dust mite extract, Kiwi extract, and Pollen extract  Review of Systems   Review of Systems  Constitutional: Positive for activity change, appetite change, chills, fatigue and fever (Subjective).  Respiratory: Positive for cough and shortness of breath. Negative for apnea, choking, chest tightness and wheezing.   Cardiovascular: Positive for chest pain. Negative for palpitations and leg swelling.  Gastrointestinal: Positive for diarrhea (Chronic) and nausea. Negative for abdominal distention, abdominal pain, anal bleeding, blood in stool, constipation, rectal pain and vomiting.  Genitourinary: Negative.   Musculoskeletal: Positive for myalgias.  Skin: Negative.   Neurological: Positive for weakness (Generalized) and headaches (Intermittent). Negative for dizziness, tremors, seizures, syncope, facial asymmetry, speech difficulty, light-headedness and numbness.  All other systems reviewed and are negative.   Physical Exam Updated Vital Signs BP  129/86 (BP Location: Left Arm)   Pulse (!) 117   Temp 99.1 F (37.3 C) (Oral)   Resp (!) 22   Ht 5' 1"  (1.549 m)   Wt 86.6 kg   LMP 01/15/2020   SpO2 99%   BMI 36.09 kg/m   Physical Exam Vitals and nursing note reviewed.  Constitutional:      General: She is not in acute distress.    Appearance: She is obese. She is ill-appearing. She is not toxic-appearing or diaphoretic.  HENT:     Head: Normocephalic and atraumatic.     Jaw: There is normal jaw occlusion.  Right Ear: Tympanic membrane, ear canal and external ear normal. There is no impacted cerumen. No hemotympanum. Tympanic membrane is not injected, scarred, perforated, erythematous, retracted or bulging.     Left Ear: Tympanic membrane, ear canal and external ear normal. There is no impacted cerumen. No hemotympanum. Tympanic membrane is not injected, scarred, perforated, erythematous, retracted or bulging.     Ears:     Comments: No Mastoid tenderness.    Nose:     Comments: Clear rhinorrhea and congestion to bilateral nares.  No sinus tenderness.    Mouth/Throat:     Comments: Posterior oropharynx clear.  Mucous membranes moist.  Tonsils without erythema or exudate.  Uvula midline without deviation.  No evidence of PTA or RPA.  No drooling, dysphasia or trismus.  Phonation normal. Neck:     Trachea: Trachea and phonation normal.     Meningeal: Brudzinski's sign and Kernig's sign absent.     Comments: No Neck stiffness or neck rigidity.  No meningismus.  No cervical lymphadenopathy. Cardiovascular:     Rate and Rhythm: Tachycardia present.     Pulses: Normal pulses.          Radial pulses are 2+ on the right side and 2+ on the left side.       Dorsalis pedis pulses are 2+ on the right side and 2+ on the left side.       Posterior tibial pulses are 2+ on the right side and 2+ on the left side.     Heart sounds: Normal heart sounds.     Comments: No murmurs rubs or gallops. Pulmonary:     Effort: Tachypnea present. No  accessory muscle usage, prolonged expiration or respiratory distress.     Comments: Clear to auscultation bilaterally without wheeze, rhonchi or rales.  No accessory muscle usage.  Speaks in short sentences while taking a breath Abdominal:     Comments: Soft, nontender without rebound or guarding.  No CVA tenderness.  Musculoskeletal:     Comments: Moves all 4 extremities without difficulty.  Lower extremities without edema, erythema or warmth.  Skin:    Comments: Brisk capillary refill.  No rashes or lesions.  Neurological:     Mental Status: She is alert.     Comments: Ambulatory in department without difficulty.  Cranial nerves II through XII grossly intact.  No facial droop.  No aphasia.     ED Results / Procedures / Treatments   Labs (all labs ordered are listed, but only abnormal results are displayed) Labs Reviewed  COMPREHENSIVE METABOLIC PANEL - Abnormal; Notable for the following components:      Result Value   Potassium 3.1 (*)    Glucose, Bld 111 (*)    BUN <5 (*)    Calcium 8.8 (*)    All other components within normal limits  CBC WITH DIFFERENTIAL/PLATELET - Abnormal; Notable for the following components:   RBC 5.16 (*)    RDW 18.7 (*)    All other components within normal limits  LACTATE DEHYDROGENASE - Abnormal; Notable for the following components:   LDH 259 (*)    All other components within normal limits  FERRITIN - Abnormal; Notable for the following components:   Ferritin 378 (*)    All other components within normal limits  C-REACTIVE PROTEIN - Abnormal; Notable for the following components:   CRP 8.8 (*)    All other components within normal limits  CULTURE, BLOOD (ROUTINE X 2)  CULTURE, BLOOD (ROUTINE X 2)  LACTIC ACID, PLASMA  LACTIC ACID, PLASMA  D-DIMER, QUANTITATIVE (NOT AT [facility])  PROCALCITONIN  FIBRINOGEN  TRIGLYCERIDES  URINALYSIS, ROUTINE W REFLEX MICROSCOPIC  I-STAT BETA HCG BLOOD, ED ([facility], [facility], [facility] ONLY)  TROPONIN I (HIGH SENSITIVITY)    TROPONIN I (HIGH SENSITIVITY)    EKG None  Radiology CT Angio Chest PE W and/or Wo Contrast  Result Date: 01/27/2020 CLINICAL DATA:  Worsening shortness of breath. EXAM: CT ANGIOGRAPHY CHEST WITH CONTRAST TECHNIQUE: Multidetector CT imaging of the chest was performed using the standard protocol during bolus administration of intravenous contrast. Multiplanar CT image reconstructions and MIPs were obtained to evaluate the vascular anatomy. CONTRAST:  136m OMNIPAQUE IOHEXOL 350 MG/ML SOLN COMPARISON:  None. FINDINGS: Cardiovascular: Satisfactory opacification of the pulmonary arteries to the segmental level. No evidence of pulmonary embolism. Normal heart size. No pericardial effusion. Mediastinum/Nodes: No enlarged mediastinal, hilar, or axillary lymph nodes. Thyroid gland, trachea, and esophagus demonstrate no significant findings. Lungs/Pleura: Moderate severity multifocal patchy infiltrates are seen throughout both lungs. There is no evidence of a pleural effusion or pneumothorax. Upper Abdomen: No acute abnormality. Musculoskeletal: No chest wall abnormality. No acute or significant osseous findings. Review of the MIP images confirms the above findings. IMPRESSION: 1. No evidence of pulmonary embolus. 2. Moderate severity multifocal patchy bilateral infiltrates. Electronically Signed   By: TVirgina NorfolkM.D.   On: 01/27/2020 17:22   DG Chest Portable 1 View  Result Date: 01/27/2020 CLINICAL DATA:  Shortness of breath, COVID positive. Additional provided: Worsening shortness of breath onset Wednesday, diagnosed with COVID May 11th. Cough. EXAM: PORTABLE CHEST 1 VIEW COMPARISON:  Chest radiograph 01/22/2020 FINDINGS: Heart size within normal limits. Interstitial opacities within the mid to lower right lung field and left lung base. Findings are consistent with atypical/viral pneumonia given provided history of COVID positivity. No evidence of pleural effusion or pneumothorax. No acute bony  abnormality identified. IMPRESSION: Interstitial opacities with the mid to lower right lung field and left lung base. Findings are consistent with atypical/viral pneumonia given the provided history of COVID positivity. Electronically Signed   By: KKellie SimmeringDO   On: 01/27/2020 13:20    Procedures .Critical Care Performed by: HNettie Elm PA-C Authorized by: HNettie Elm PA-C   Critical care provider statement:    Critical care time (minutes):  45   Critical care was necessary to treat or prevent imminent or life-threatening deterioration of the following conditions:  Respiratory failure   Critical care was time spent personally by me on the following activities:  Discussions with consultants, evaluation of patient's response to treatment, examination of patient, ordering and performing treatments and interventions, ordering and review of laboratory studies, ordering and review of radiographic studies, pulse oximetry, re-evaluation of patient's condition, obtaining history from patient or surrogate and review of old charts   (including critical care time)  Medications Ordered in ED Medications  potassium chloride 10 mEq in 100 mL IVPB (10 mEq Intravenous New Bag/Given 01/27/20 1633)  remdesivir 200 mg in sodium chloride 0.9% 250 mL IVPB (has no administration in time range)    Followed by  remdesivir 100 mg in sodium chloride 0.9 % 100 mL IVPB (has no administration in time range)  sodium chloride flush (NS) 0.9 % injection 3 mL (3 mLs Intravenous Given 01/27/20 1635)  dexamethasone (DECADRON) injection 8 mg (8 mg Intravenous Given 01/27/20 1621)  sodium chloride 0.9 % bolus 1,000 mL (1,000 mLs Intravenous New Bag/Given 01/27/20 1633)  iohexol (OMNIPAQUE) 350 MG/ML injection  100 mL (100 mLs Intravenous Contrast Given 01/27/20 1659)    ED Course  I have reviewed the triage vital signs and the nursing notes.  Pertinent labs & imaging results that were available during my care  of the patient were reviewed by me and considered in my medical decision making (see chart for details).  11 old female presents for evaluation of shortness of breath and chest pain in setting of known Covid infection.  Diagnosed approximately 1 week ago.  Has been trying symptomatic relief at home without improvement.  Has had increasing shortness of breath since she was seen here a few days ago however today feels like she had abrupt onset shortness of breath.  Nonfocal neuro exam.  She has low-grade temp nine 99.5.  She is tachypneic and tachycardic however not hypoxic.  No unilateral leg swelling, redness or warmth, no prior history of PE or DVT however given with abrupt onset shortness of breath will obtain CTA chest to rule out PE.  Plan on obtaining cardiac markers.  She did have some labs obtained from triage which were personally reviewed and interpreted.  History of colitis.  Chronic diarrhea.  No recent antibiotics or travel.  Abdomen soft, nontender.  Low suspicion for intra-abdominal pathology.  Labs and imaging personally reviewed and interpreted CBC without leukocytosis, hemoglobin 83.1 Metabolic panel with hypokalemia at 3.1, hyperglycemia to 111 however no additional electrolyte, renal abnormality Pregnancy test negative Lactic acid 1.1 Elevated inflammatory markers D-dimer 0.38 Trop 4, given symptoms x day, low suspicion for ACS as I would expect troponin to be elevated at this time, suspect chest pain due to shortness of breath, continued coughing. CT without PE however with moderate multifocal pneumonia likely due to Covid infection.  Patient reassessed.  Continues to be tachycardic and tachypneic.  Oxygen saturation 94% on room air.  Pending CT imaging  Patient reassessed.  Continues to have tachypnia and tachycardia.  Oxygen saturation 91-94% on room air however becomes extremely dyspneic with little movement. Respiratory rate into the 30's with little movement and heart rate  into the 120s, 130s with movement to bedside.  Patient started 2 L via nasal cannula for tachypnea.  Will admit for acute respiratory failure likely due to her Covid infection requiring supplemental oxygen.  1809: CONSULT with Dr. Jonelle Sidle with [facility] who will evaluate for admission  The patient appears reasonably stabilized for admission considering the current resources, flow, and capabilities available in the ED at this time, and I doubt any other [facility] requiring further screening and/or treatment in the ED prior to admission.    MDM Rules/Calculators/A&P                      Erika David was evaluated in Emergency Department on 01/27/2020 for the symptoms described in the history of present illness. She was evaluated in the context of the global COVID-19 pandemic, which necessitated consideration that the patient might be at risk for infection with the SARS-CoV-2 virus that causes COVID-19. Institutional protocols and algorithms that pertain to the evaluation of patients at risk for COVID-19 are in a state of rapid change based on information released by regulatory bodies including the CDC and federal and state organizations. These policies and algorithms were followed during the patient's care in the ED. Final Clinical Impression(s) / ED Diagnoses Final diagnoses:  COVID-19  Acute respiratory failure, unspecified whether with hypoxia or hypercapnia ([place])  Hypokalemia    Rx / DC Orders ED Discharge Orders  None       Cashe Gatt A, PA-C 01/27/20 2023    Davonna Belling, MD 01/27/20 2244

## 2020-01-28 ENCOUNTER — Encounter (HOSPITAL_COMMUNITY): Payer: Self-pay | Admitting: Internal Medicine

## 2020-01-28 DIAGNOSIS — E876 Hypokalemia: Secondary | ICD-10-CM

## 2020-01-28 DIAGNOSIS — U071 COVID-19: Secondary | ICD-10-CM | POA: Diagnosis not present

## 2020-01-28 DIAGNOSIS — D500 Iron deficiency anemia secondary to blood loss (chronic): Secondary | ICD-10-CM

## 2020-01-28 DIAGNOSIS — J9600 Acute respiratory failure, unspecified whether with hypoxia or hypercapnia: Secondary | ICD-10-CM | POA: Diagnosis not present

## 2020-01-28 LAB — CBC WITH DIFFERENTIAL/PLATELET
Abs Immature Granulocytes: 0 10*3/uL (ref 0.00–0.07)
Basophils Absolute: 0 10*3/uL (ref 0.0–0.1)
Basophils Relative: 0 %
Eosinophils Absolute: 0 10*3/uL (ref 0.0–0.5)
Eosinophils Relative: 0 %
HCT: 39.4 % (ref 36.0–46.0)
Hemoglobin: 13.1 g/dL (ref 12.0–15.0)
Lymphocytes Relative: 12 %
Lymphs Abs: 0.3 10*3/uL — ABNORMAL LOW (ref 0.7–4.0)
MCH: 27.9 pg (ref 26.0–34.0)
MCHC: 33.2 g/dL (ref 30.0–36.0)
MCV: 83.8 fL (ref 80.0–100.0)
Monocytes Absolute: 0.1 10*3/uL (ref 0.1–1.0)
Monocytes Relative: 3 %
Neutro Abs: 2 10*3/uL (ref 1.7–7.7)
Neutrophils Relative %: 85 %
Platelets: 200 10*3/uL (ref 150–400)
RBC: 4.7 MIL/uL (ref 3.87–5.11)
RDW: 19 % — ABNORMAL HIGH (ref 11.5–15.5)
WBC: 2.3 10*3/uL — ABNORMAL LOW (ref 4.0–10.5)
nRBC: 0 % (ref 0.0–0.2)
nRBC: 0 /100 WBC

## 2020-01-28 LAB — D-DIMER, QUANTITATIVE: D-Dimer, Quant: 0.42 ug/mL-FEU (ref 0.00–0.50)

## 2020-01-28 LAB — COMPREHENSIVE METABOLIC PANEL
ALT: 31 U/L (ref 0–44)
AST: 39 U/L (ref 15–41)
Albumin: 3.3 g/dL — ABNORMAL LOW (ref 3.5–5.0)
Alkaline Phosphatase: 62 U/L (ref 38–126)
Anion gap: 12 (ref 5–15)
BUN: 7 mg/dL (ref 6–20)
CO2: 23 mmol/L (ref 22–32)
Calcium: 8.5 mg/dL — ABNORMAL LOW (ref 8.9–10.3)
Chloride: 104 mmol/L (ref 98–111)
Creatinine, Ser: 0.8 mg/dL (ref 0.44–1.00)
GFR calc Af Amer: >60 mL/min (ref >60)
GFR calc non Af Amer: >60 mL/min (ref >60)
Glucose, Bld: 140 mg/dL — ABNORMAL HIGH (ref 70–99)
Potassium: 3.7 mmol/L (ref 3.5–5.1)
Sodium: 139 mmol/L (ref 135–145)
Total Bilirubin: 0.2 mg/dL — ABNORMAL LOW (ref 0.3–1.2)
Total Protein: 6.6 g/dL (ref 6.5–8.1)

## 2020-01-28 LAB — C-REACTIVE PROTEIN: CRP: 7.7 mg/dL — ABNORMAL HIGH (ref <1.0)

## 2020-01-28 LAB — FERRITIN: Ferritin: 400 ng/mL — ABNORMAL HIGH (ref 11–307)

## 2020-01-28 MED ORDER — DICYCLOMINE HCL 10 MG PO CAPS
10.0000 mg | ORAL_CAPSULE | Freq: Three times a day (TID) | ORAL | Status: DC
  Administered 2020-01-28 – 2020-01-31 (×12): 10 mg via ORAL
  Filled 2020-01-28 (×14): qty 1

## 2020-01-28 MED ORDER — LOPERAMIDE HCL 2 MG PO CAPS
2.0000 mg | ORAL_CAPSULE | Freq: Three times a day (TID) | ORAL | Status: DC | PRN
  Administered 2020-01-28: 2 mg via ORAL
  Filled 2020-01-28: qty 1

## 2020-01-28 MED ORDER — ENSURE ENLIVE PO LIQD
237.0000 mL | Freq: Two times a day (BID) | ORAL | Status: DC
  Administered 2020-01-28 – 2020-01-31 (×5): 237 mL via ORAL

## 2020-01-28 MED ORDER — MELATONIN 3 MG PO TABS
6.0000 mg | ORAL_TABLET | Freq: Every day | ORAL | Status: DC
  Administered 2020-01-28 – 2020-01-30 (×3): 6 mg via ORAL
  Filled 2020-01-28 (×4): qty 2

## 2020-01-28 NOTE — Progress Notes (Signed)
Initial Nutrition Assessment  DOCUMENTATION CODES:   Obesity unspecified  INTERVENTION:  Provide Ensure Enlive po BID, each supplement provides 350 kcal and 20 grams of protein  Encourage adequate PO intake.   NUTRITION DIAGNOSIS:   Increased nutrient needs related to acute illness(COVID) as evidenced by estimated needs.  GOAL:   Patient will meet greater than or equal to 90% of their needs  MONITOR:   PO intake, Supplement acceptance, Skin, Weight trends, Labs, I & O's  REASON FOR ASSESSMENT:   Malnutrition Screening Tool    ASSESSMENT:   44 y.o. female with PMHx of lymphocytic colitis, iron deficiency anemia from menorrhagia with COVID-19 on 5/11-presented with cough, worsening shortness of breath along with nausea vomiting and diarrhea.  RD working remotely.  Pt reports having no taste and difficulty eating, however reports she has able to eat and tolerate her lunch today. Nausea, vomiting, and diarrhea has improved. RD to order nutritional supplements to aid in caloric and protein needs. Weight has been fluctuating her weight records. Unable to complete Nutrition-Focused physical exam at this time.   Labs and medications reviewed.   Diet Order:   Diet Order            Diet regular Room service appropriate? Yes; Fluid consistency: Thin  Diet effective now              EDUCATION NEEDS:   Not appropriate for education at this time  Skin:  Skin Assessment: Reviewed RN Assessment  Last BM:  5/19  Height:   Ht Readings from Last 1 Encounters:  01/27/20 5' 1"  (1.549 m)    Weight:   Wt Readings from Last 1 Encounters:  01/27/20 82.9 kg    BMI:  Body mass index is 34.53 kg/m.  Estimated Nutritional Needs:   Kcal:  1700-1900  Protein:  90-100 grams  Fluid:  >/= 1.8 L/day  Corrin Parker, MS, RD, LDN RD pager number/after hours weekend pager number on Amion.

## 2020-01-28 NOTE — Progress Notes (Signed)
PROGRESS NOTE                                                                                                                                                                                                             Patient Demographics:    Erika David, is a 44 y.o. female, DOB - 1976/06/14, GYI:[id number]  Outpatient Primary MD for the patient is Nche, Charlene Brooke, NP   Admit date - 01/27/2020   LOS - 1  Chief Complaint  Patient presents with  . Shortness of Breath       Brief Narrative: Patient is a 44 y.o. female with PMHx of lymphocytic colitis, iron deficiency anemia from menorrhagia with COVID-19 on 5/11-presented with cough, worsening shortness of breath along with nausea vomiting and diarrhea.  Significant Events: 5/19>> admit to [facility] for COVID-19 pneumonia  COVID-19 medications: Steroids: 5/19>> Remdesivir: 5/19>> Actemra: 5/19 x 1 by admitting MD  Antibiotics: None  Microbiology data: 5/19: Blood cultures>> negative so far  Significant studies: 5/19: CTA chest: No PE, moderate multifocal patchy infiltrates  DVT prophylaxis: SQ Lovenox  Procedures: None  Consults: None    Subjective:    Erika David today no longer has any nausea vomiting-she had 2 loose but not watery BMs today.  On 1 L of oxygen this morning to maintain O2 saturations in the 90s.   Assessment  & Plan :   Acute Hypoxic Resp Failure due to Covid 19 Viral pneumonia: Appears to have mild hypoxemia-continue to attempt to titrate down FiO2.  Continue steroids/remdesivir.  Follow clinical trajectory and inflammatory markers.  Fever: afebrile Prone/Incentive Spirometry: encouraged incentive spirometry use 3-4/hour. O2 requirements:  SpO2: 94 % O2 Flow Rate (L/min): 1 L/min   COVID-19 Labs: Recent Labs    01/27/20 1543 01/28/20 0554  DDIMER 0.38 0.42  FERRITIN 378* 400*  LDH 259*  --   CRP 8.8* 7.7*    No results  found for: BNP  Recent Labs  Lab 01/27/20 1543  PROCALCITON <0.10    Lab Results  Component Value Date   SARSCOV2NAA POSITIVE (A) 01/19/2020   [place] NEGATIVE 11/08/2019   SARSCOV2NAA NOT DETECTED 10/19/2019   [place] NEGATIVE 06/18/2019    Hypokalemia: Repleted  Lymphocytic colitis: Likely having some intermittent diarrhea secondary to this rather than Covid-we will discuss with pharmacy if we need to resume  budesonide as she is already on Decadron.  History of iron deficiency anemia: Hemoglobin currently stable-continue to follow  Morbid Obesity: Estimated body mass index is 34.53 kg/m as calculated from the following:   Height as of this encounter: 5' 1"  (1.549 m).   Weight as of this encounter: 82.9 kg.    ABG: No results found for: PHART, PCO2ART, PO2ART, HCO3, TCO2, ACIDBASEDEF, O2SAT  Vent Settings: N/A  Condition -Stable  Family Communication  : Patient will update her family-I have asked her to let me know if they have questions for me.  Code Status :  Full Code  Diet :  Diet Order            Diet regular Room service appropriate? Yes; Fluid consistency: Thin  Diet effective now               Disposition Plan  :   Status is: Inpatient  Remains inpatient appropriate because:Inpatient level of care appropriate due to severity of illness   Dispo: The patient is from: Home              Anticipated d/c is to: Home              Anticipated d/c date is: 2 days              Patient currently is not medically stable to d/c.  Barriers to discharge: Hypoxia requiring O2 supplementation/complete 5 days of IV Remdesivir  Antimicorbials  :    Anti-infectives (From admission, onward)   Start     Dose/Rate Route Frequency Ordered Stop   01/28/20 1000  remdesivir 100 mg in sodium chloride 0.9 % 100 mL IVPB     100 mg 200 mL/hr over 30 Minutes Intravenous Daily 01/27/20 1748 02/01/20 0959   01/28/20 1000  remdesivir 100 mg in sodium chloride 0.9 %  100 mL IVPB  Status:  Discontinued     100 mg 200 mL/hr over 30 Minutes Intravenous Daily 01/27/20 1915 01/27/20 1917   01/27/20 1914  remdesivir 200 mg in sodium chloride 0.9% 250 mL IVPB  Status:  Discontinued     200 mg 580 mL/hr over 30 Minutes Intravenous Once 01/27/20 1915 01/27/20 1917   01/27/20 1800  remdesivir 200 mg in sodium chloride 0.9% 250 mL IVPB     200 mg 580 mL/hr over 30 Minutes Intravenous Once 01/27/20 1748 01/27/20 2205      Inpatient Medications  Scheduled Meds: . vitamin C  500 mg Oral Daily  . dexamethasone (DECADRON) injection  6 mg Intravenous Q24H  . enoxaparin (LOVENOX) injection  40 mg Subcutaneous Q24H  . Ipratropium-Albuterol  1 puff Inhalation Q6H  . zinc sulfate  220 mg Oral Daily   Continuous Infusions: . remdesivir 100 mg in NS 100 mL 100 mg (01/28/20 0905)   PRN Meds:.acetaminophen, chlorpheniramine-HYDROcodone, guaiFENesin-dextromethorphan, ondansetron **OR** ondansetron (ZOFRAN) IV   Time Spent in minutes  25  See all Orders from today for further details   Oren Binet M.D on 01/28/2020 at 1:18 PM  To page go to [email or web address] - use universal password  [facility] -  Office  [phone]    Objective:   Vitals:   01/27/20 2112 01/27/20 2355 01/28/20 0355 01/28/20 0730  BP:  126/89 124/85 (!) 129/93  Pulse:  94 95 75  Resp:  20 14 20   Temp:  99 F (37.2 C) 98.9 F (37.2 C) 99 F (37.2 C)  TempSrc:  Oral Oral Oral  SpO2:  94% 95% 94%  Weight: 82.9 kg     Height: 5' 1"  (1.549 m)       Wt Readings from Last 3 Encounters:  01/27/20 82.9 kg  01/22/20 86.6 kg  01/19/20 86.6 kg     Intake/Output Summary (Last 24 hours) at 01/28/2020 1318 Last data filed at 01/28/2020 0905 Gross per 24 hour  Intake 300 ml  Output --  Net 300 ml     Physical Exam Gen Exam:Alert awake-not in any distress HEENT:atraumatic, normocephalic Chest: B/L clear to auscultation anteriorly CVS:S1S2 regular Abdomen:soft non tender,  non distended Extremities:no edema Neurology: Non focal Skin: no rash   Data Review:    CBC Recent Labs  Lab 01/22/20 1622 01/27/20 1306 01/27/20 1926 01/28/20 0554  WBC 3.6* 4.7 5.3 2.3*  HGB 14.6 14.3 13.4 13.1  HCT 44.7 42.5 40.9 39.4  PLT 205 209 186 200  MCV 83.6 82.4 83.3 83.8  MCH 27.3 27.7 27.3 27.9  MCHC 32.7 33.6 32.8 33.2  RDW 20.0* 18.7* 18.8* 19.0*  LYMPHSABS 1.1 0.9  --  0.3*  MONOABS 0.3 0.3  --  0.1  EOSABS 0.0 0.0  --  0.0  BASOSABS 0.0 0.0  --  0.0    Chemistries  Recent Labs  Lab 01/22/20 1622 01/27/20 1306 01/27/20 1926 01/28/20 0554  NA 135 137  --  139  K 3.1* 3.1*  --  3.7  CL 99 101  --  104  CO2 24 25  --  23  GLUCOSE 85 111*  --  140*  BUN 8 <5*  --  7  CREATININE 0.93 0.81 0.80 0.80  CALCIUM 8.5* 8.8*  --  8.5*  AST  --  33  --  39  ALT  --  26  --  31  ALKPHOS  --  72  --  62  BILITOT  --  0.4  --  0.2*   ------------------------------------------------------------------------------------------------------------------ Recent Labs    01/27/20 1543  TRIG 143    No results found for: HGBA1C ------------------------------------------------------------------------------------------------------------------ No results for input(s): TSH, T4TOTAL, T3FREE, THYROIDAB in the last 72 hours.  Invalid input(s): FREET3 ------------------------------------------------------------------------------------------------------------------ Recent Labs    01/27/20 1543 01/28/20 0554  FERRITIN 378* 400*    Coagulation profile No results for input(s): INR, PROTIME in the last 168 hours.  Recent Labs    01/27/20 1543 01/28/20 0554  DDIMER 0.38 0.42    Cardiac Enzymes No results for input(s): CKMB, TROPONINI, MYOGLOBIN in the last 168 hours.  Invalid input(s): CK ------------------------------------------------------------------------------------------------------------------ No results found for: BNP  Micro Results Recent Results  (from the past 240 hour(s))  SARS Coronavirus 2 by RT PCR (hospital order, performed in [facility] hospital lab) Nasopharyngeal Nasopharyngeal Swab     Status: Abnormal   Collection Time: 01/19/20  6:31 AM   Specimen: Nasopharyngeal Swab  Result Value Ref Range Status   SARS Coronavirus 2 POSITIVE (A) NEGATIVE Final    Comment: RESULT CALLED TO, READ BACK BY AND VERIFIED WITH: CALLED TO A.HARTLEY AT [place] ON [id number] (NOTE) SARS-CoV-2 target nucleic acids are DETECTED SARS-CoV-2 RNA is generally detectable in upper respiratory specimens  during the acute phase of infection.  Positive results are indicative  of the presence of the identified virus, but do not rule out bacterial infection or co-infection with other pathogens not detected by the test.  Clinical correlation with patient history and  other diagnostic information is necessary to determine patient infection status.  The expected result is negative.  Fact Sheet for Patients:   StrictlyIdeas.no  Fact Sheet for Healthcare Providers:   BankingDealers.co.za   This test is not yet approved or cleared by the [place] FDA and  has been authorized for detection and/or diagnosis of SARS-CoV-2 by FDA under an Emergency Use Authorization (EUA).  This EUA will remain in effect (meaning this  test can be used) for the duration of  the COVID-19 declaration under Section 564(b)(1) of the Act, 21 U.S.C. section 360-bbb-3(b)(1), unless the authorization is terminated or revoked sooner. Performed at [facility], [address] [place], [place] [address]   Blood Culture (routine x 2)     Status: None (Preliminary result)   Collection Time: 01/27/20  3:43 PM   Specimen: BLOOD  Result Value Ref Range Status   Specimen Description BLOOD RIGHT ANTECUBITAL  Final   Special Requests   Final    BOTTLES DRAWN AEROBIC AND ANAEROBIC Blood Culture adequate volume   Culture   Final      NO GROWTH < 24 HOURS Performed at [facility], [place] [address], [place], [place] [address]    Report Status PENDING  Incomplete  Blood Culture (routine x 2)     Status: None (Preliminary result)   Collection Time: 01/27/20  3:48 PM   Specimen: BLOOD  Result Value Ref Range Status   Specimen Description BLOOD LEFT ANTECUBITAL  Final   Special Requests   Final    BOTTLES DRAWN AEROBIC AND ANAEROBIC Blood Culture adequate volume   Culture   Final    NO GROWTH < 24 HOURS Performed at [facility], [place] [address] [place], [place] [address]    Report Status PENDING  Incomplete    Radiology Reports DG Chest 2 View  Result Date: 01/22/2020 CLINICAL DATA:  Worsening weakness. COVID-19 positive. EXAM: CHEST - 2 VIEW COMPARISON:  01/19/2020 FINDINGS: Normal sized heart. Clear lungs. Stable mild peribronchial thickening. Unremarkable bones. IMPRESSION: Stable mild bronchitic changes. No acute abnormality. Electronically Signed   By: Claudie Revering M.D.   On: 01/22/2020 13:13   CT Angio Chest PE W and/or Wo Contrast  Result Date: 01/27/2020 CLINICAL DATA:  Worsening shortness of breath. EXAM: CT ANGIOGRAPHY CHEST WITH CONTRAST TECHNIQUE: Multidetector CT imaging of the chest was performed using the standard protocol during bolus administration of intravenous contrast. Multiplanar CT image reconstructions and MIPs were obtained to evaluate the vascular anatomy. CONTRAST:  157m OMNIPAQUE IOHEXOL 350 MG/ML SOLN COMPARISON:  None. FINDINGS: Cardiovascular: Satisfactory opacification of the pulmonary arteries to the segmental level. No evidence of pulmonary embolism. Normal heart size. No pericardial effusion. Mediastinum/Nodes: No enlarged mediastinal, hilar, or axillary lymph nodes. Thyroid gland, trachea, and esophagus demonstrate no significant findings. Lungs/Pleura: Moderate severity multifocal patchy infiltrates are seen throughout both lungs. There is no evidence of a pleural effusion  or pneumothorax. Upper Abdomen: No acute abnormality. Musculoskeletal: No chest wall abnormality. No acute or significant osseous findings. Review of the MIP images confirms the above findings. IMPRESSION: 1. No evidence of pulmonary embolus. 2. Moderate severity multifocal patchy bilateral infiltrates. Electronically Signed   By: TVirgina NorfolkM.D.   On: 01/27/2020 17:22   DG Chest Portable 1 View  Result Date: 01/27/2020 CLINICAL DATA:  Shortness of breath, COVID positive. Additional provided: Worsening shortness of breath onset Wednesday, diagnosed with COVID May 11th. Cough. EXAM: PORTABLE CHEST 1 VIEW COMPARISON:  Chest radiograph 01/22/2020 FINDINGS: Heart size within normal limits. Interstitial opacities within the mid to  lower right lung field and left lung base. Findings are consistent with atypical/viral pneumonia given provided history of COVID positivity. No evidence of pleural effusion or pneumothorax. No acute bony abnormality identified. IMPRESSION: Interstitial opacities with the mid to lower right lung field and left lung base. Findings are consistent with atypical/viral pneumonia given the provided history of COVID positivity. Electronically Signed   By: Kellie Simmering DO   On: 01/27/2020 13:20   DG Chest Portable 1 View  Result Date: 01/19/2020 CLINICAL DATA:  Cough and aches, probable COVID EXAM: PORTABLE CHEST 1 VIEW COMPARISON:  10/19/2019 FINDINGS: Normal heart size and mediastinal contours. Low lung volumes. No acute infiltrate or edema. No effusion or pneumothorax. No acute osseous findings. IMPRESSION: Negative low volume chest. Electronically Signed   By: Monte Fantasia M.D.   On: 01/19/2020 07:05

## 2020-01-28 NOTE — Plan of Care (Signed)
  Problem: Respiratory: Goal: Will maintain a patent airway Outcome: Progressing Goal: Complications related to the disease process, condition or treatment will be avoided or minimized Outcome: Progressing   

## 2020-01-29 ENCOUNTER — Telehealth: Payer: Self-pay | Admitting: Gastroenterology

## 2020-01-29 ENCOUNTER — Telehealth: Payer: Self-pay | Admitting: Nurse Practitioner

## 2020-01-29 LAB — CBC WITH DIFFERENTIAL/PLATELET
Abs Immature Granulocytes: 0.01 10*3/uL (ref 0.00–0.07)
Basophils Absolute: 0 10*3/uL (ref 0.0–0.1)
Basophils Relative: 0 %
Eosinophils Absolute: 0 10*3/uL (ref 0.0–0.5)
Eosinophils Relative: 0 %
HCT: 39.5 % (ref 36.0–46.0)
Hemoglobin: 13 g/dL (ref 12.0–15.0)
Immature Granulocytes: 0 %
Lymphocytes Relative: 25 %
Lymphs Abs: 1 10*3/uL (ref 0.7–4.0)
MCH: 27.8 pg (ref 26.0–34.0)
MCHC: 32.9 g/dL (ref 30.0–36.0)
MCV: 84.4 fL (ref 80.0–100.0)
Monocytes Absolute: 0.4 10*3/uL (ref 0.1–1.0)
Monocytes Relative: 10 %
Neutro Abs: 2.6 10*3/uL (ref 1.7–7.7)
Neutrophils Relative %: 65 %
Platelets: 227 10*3/uL (ref 150–400)
RBC: 4.68 MIL/uL (ref 3.87–5.11)
RDW: 19.2 % — ABNORMAL HIGH (ref 11.5–15.5)
WBC: 4.1 10*3/uL (ref 4.0–10.5)
nRBC: 0 % (ref 0.0–0.2)

## 2020-01-29 LAB — COMPREHENSIVE METABOLIC PANEL
ALT: 38 U/L (ref 0–44)
AST: 36 U/L (ref 15–41)
Albumin: 3.3 g/dL — ABNORMAL LOW (ref 3.5–5.0)
Alkaline Phosphatase: 60 U/L (ref 38–126)
Anion gap: 12 (ref 5–15)
BUN: 11 mg/dL (ref 6–20)
CO2: 24 mmol/L (ref 22–32)
Calcium: 8.9 mg/dL (ref 8.9–10.3)
Chloride: 107 mmol/L (ref 98–111)
Creatinine, Ser: 0.83 mg/dL (ref 0.44–1.00)
GFR calc Af Amer: >60 mL/min (ref >60)
GFR calc non Af Amer: >60 mL/min (ref >60)
Glucose, Bld: 122 mg/dL — ABNORMAL HIGH (ref 70–99)
Potassium: 3.4 mmol/L — ABNORMAL LOW (ref 3.5–5.1)
Sodium: 143 mmol/L (ref 135–145)
Total Bilirubin: 0.6 mg/dL (ref 0.3–1.2)
Total Protein: 6.6 g/dL (ref 6.5–8.1)

## 2020-01-29 LAB — C-REACTIVE PROTEIN: CRP: 3.4 mg/dL — ABNORMAL HIGH (ref <1.0)

## 2020-01-29 LAB — FERRITIN: Ferritin: 384 ng/mL — ABNORMAL HIGH (ref 11–307)

## 2020-01-29 LAB — D-DIMER, QUANTITATIVE: D-Dimer, Quant: 0.62 ug/mL-FEU — ABNORMAL HIGH (ref 0.00–0.50)

## 2020-01-29 MED ORDER — BUDESONIDE 3 MG PO CPEP
3.0000 mg | ORAL_CAPSULE | Freq: Every day | ORAL | Status: DC
  Administered 2020-01-29 – 2020-01-31 (×3): 3 mg via ORAL
  Filled 2020-01-29 (×3): qty 1

## 2020-01-29 MED ORDER — POTASSIUM CHLORIDE CRYS ER 20 MEQ PO TBCR
40.0000 meq | EXTENDED_RELEASE_TABLET | Freq: Once | ORAL | Status: AC
  Administered 2020-01-29: 40 meq via ORAL
  Filled 2020-01-29: qty 2

## 2020-01-29 NOTE — Telephone Encounter (Signed)
Pt's sister called again to confirm that we received forms that she faxed over attention to [facility]. I explained to her that she needs to speak with [employer] and I transferred her over but before she said that she still would like to speak with you. Pls call her.

## 2020-01-29 NOTE — Plan of Care (Signed)
  Problem: Respiratory: Goal: Will maintain a patent airway Outcome: Progressing Goal: Complications related to the disease process, condition or treatment will be avoided or minimized Outcome: Progressing   

## 2020-01-29 NOTE — Progress Notes (Signed)
PROGRESS NOTE                                                                                                                                                                                                             Patient Demographics:    Erika David, is a 44 y.o. female, DOB - April 30, 1976, JQZ:[id number]  Outpatient Primary MD for the patient is Nche, Charlene Brooke, NP   Admit date - 01/27/2020   LOS - 2  Chief Complaint  Patient presents with  . Shortness of Breath       Brief Narrative: Patient is a 44 y.o. female with PMHx of lymphocytic colitis, iron deficiency anemia from menorrhagia with COVID-19 on 5/11-presented with cough, worsening shortness of breath along with nausea vomiting and diarrhea.  Significant Events: 5/19>> admit to [facility] for COVID-19 pneumonia  COVID-19 medications: Steroids: 5/19>> Remdesivir: 5/19>> Actemra: 5/19 x 1 by admitting MD  Antibiotics: None  Microbiology data: 5/19: Blood cultures>> negative so far  Significant studies: 5/19: CTA chest: No PE, moderate multifocal patchy infiltrates  DVT prophylaxis: SQ Lovenox  Procedures: None  Consults: None    Subjective:   Overall better-complains of poor appetite.  Titrated to room air earlier this morning.   Assessment  & Plan :   Acute Hypoxic Resp Failure due to Covid 19 Viral pneumonia: Improved-titrated to room air earlier this morning.  CRP downtrending-continue steroids/remdesivir.  Follow clinical trajectory/inflammatory markers.    Fever: afebrile Prone/Incentive Spirometry: encouraged incentive spirometry use 3-4/hour. O2 requirements:  SpO2: 97 % O2 Flow Rate (L/min): 1 L/min   COVID-19 Labs: Recent Labs    01/27/20 1543 01/28/20 0554 01/29/20 0358  DDIMER 0.38 0.42 0.62*  FERRITIN 378* 400* 384*  LDH 259*  --   --   CRP 8.8* 7.7* 3.4*    No results found for: BNP  Recent Labs  Lab  01/27/20 1543  PROCALCITON <0.10    Lab Results  Component Value Date   SARSCOV2NAA POSITIVE (A) 01/19/2020   [place] NEGATIVE 11/08/2019   SARSCOV2NAA NOT DETECTED 10/19/2019   [place] NEGATIVE 06/18/2019    Hypokalemia: Repleted  Lymphocytic colitis: Likely having some intermittent diarrhea secondary to this rather than Covid-after discussion with pharmacy-have resumed budesonide-as this is mostly locally acting.  Hypokalemia: Replete and recheck  History of iron deficiency anemia: Hemoglobin currently stable-continue  to follow  Morbid Obesity: Estimated body mass index is 34.53 kg/m as calculated from the following:   Height as of this encounter: 5' 1"  (1.549 m).   Weight as of this encounter: 82.9 kg.    ABG: No results found for: PHART, PCO2ART, PO2ART, HCO3, TCO2, ACIDBASEDEF, O2SAT  Vent Settings: N/A  Condition -Stable  Family Communication  : Patient will update her family-I have asked her to let me know if they have questions for me.  Code Status :  Full Code  Diet :  Diet Order            Diet regular Room service appropriate? Yes; Fluid consistency: Thin  Diet effective now               Disposition Plan  :   Status is: Inpatient  Remains inpatient appropriate because:Inpatient level of care appropriate due to severity of illness  Dispo: The patient is from: Home              Anticipated d/c is to: Home              Anticipated d/c date is: 2 days              Patient currently is not medically stable to d/c.  Barriers to discharge: Hypoxia requiring O2 supplementation/complete 5 days of IV Remdesivir   Antimicorbials  :    Anti-infectives (From admission, onward)   Start     Dose/Rate Route Frequency Ordered Stop   01/28/20 1000  remdesivir 100 mg in sodium chloride 0.9 % 100 mL IVPB     100 mg 200 mL/hr over 30 Minutes Intravenous Daily 01/27/20 1748 02/01/20 0959   01/28/20 1000  remdesivir 100 mg in sodium chloride 0.9 % 100  mL IVPB  Status:  Discontinued     100 mg 200 mL/hr over 30 Minutes Intravenous Daily 01/27/20 1915 01/27/20 1917   01/27/20 1914  remdesivir 200 mg in sodium chloride 0.9% 250 mL IVPB  Status:  Discontinued     200 mg 580 mL/hr over 30 Minutes Intravenous Once 01/27/20 1915 01/27/20 1917   01/27/20 1800  remdesivir 200 mg in sodium chloride 0.9% 250 mL IVPB     200 mg 580 mL/hr over 30 Minutes Intravenous Once 01/27/20 1748 01/27/20 2205      Inpatient Medications  Scheduled Meds: . vitamin C  500 mg Oral Daily  . budesonide  3 mg Oral Daily  . dexamethasone (DECADRON) injection  6 mg Intravenous Q24H  . dicyclomine  10 mg Oral TID AC & HS  . enoxaparin (LOVENOX) injection  40 mg Subcutaneous Q24H  . feeding supplement (ENSURE ENLIVE)  237 mL Oral BID BM  . Ipratropium-Albuterol  1 puff Inhalation Q6H  . melatonin  6 mg Oral QHS  . zinc sulfate  220 mg Oral Daily   Continuous Infusions: . remdesivir 100 mg in NS 100 mL 100 mg (01/29/20 0923)   PRN Meds:.acetaminophen, chlorpheniramine-HYDROcodone, guaiFENesin-dextromethorphan, loperamide, ondansetron **OR** ondansetron (ZOFRAN) IV   Time Spent in minutes  25  See all Orders from today for further details   Oren Binet M.D on 01/29/2020 at 3:24 PM  To page go to [email or web address] - use universal password  [facility] -  Office  [phone]    Objective:   Vitals:   01/28/20 2050 01/29/20 0500 01/29/20 0516 01/29/20 0817  BP: 125/83  (!) 132/97 120/66  Pulse: 93  73 72  Resp: 20  16 17  Temp: 98.3 F (36.8 C) 98.5 F (36.9 C) 98.5 F (36.9 C) 98.3 F (36.8 C)  TempSrc: Oral Oral Oral Oral  SpO2: 96%  94% 97%  Weight:      Height:        Wt Readings from Last 3 Encounters:  01/27/20 82.9 kg  01/22/20 86.6 kg  01/19/20 86.6 kg     Intake/Output Summary (Last 24 hours) at 01/29/2020 1524 Last data filed at 01/28/2020 2200 Gross per 24 hour  Intake 300 ml  Output --  Net 300 ml     Physical  Exam Gen Exam:Alert awake-not in any distress HEENT:atraumatic, normocephalic Chest: B/L clear to auscultation anteriorly CVS:S1S2 regular Abdomen:soft non tender, non distended Extremities:no edema Neurology: Non focal Skin: no rash   Data Review:    CBC Recent Labs  Lab 01/22/20 1622 01/27/20 1306 01/27/20 1926 01/28/20 0554 01/29/20 0358  WBC 3.6* 4.7 5.3 2.3* 4.1  HGB 14.6 14.3 13.4 13.1 13.0  HCT 44.7 42.5 40.9 39.4 39.5  PLT 205 209 186 200 227  MCV 83.6 82.4 83.3 83.8 84.4  MCH 27.3 27.7 27.3 27.9 27.8  MCHC 32.7 33.6 32.8 33.2 32.9  RDW 20.0* 18.7* 18.8* 19.0* 19.2*  LYMPHSABS 1.1 0.9  --  0.3* 1.0  MONOABS 0.3 0.3  --  0.1 0.4  EOSABS 0.0 0.0  --  0.0 0.0  BASOSABS 0.0 0.0  --  0.0 0.0    Chemistries  Recent Labs  Lab 01/22/20 1622 01/27/20 1306 01/27/20 1926 01/28/20 0554 01/29/20 0358  NA 135 137  --  139 143  K 3.1* 3.1*  --  3.7 3.4*  CL 99 101  --  104 107  CO2 24 25  --  23 24  GLUCOSE 85 111*  --  140* 122*  BUN 8 <5*  --  7 11  CREATININE 0.93 0.81 0.80 0.80 0.83  CALCIUM 8.5* 8.8*  --  8.5* 8.9  AST  --  33  --  39 36  ALT  --  26  --  31 38  ALKPHOS  --  72  --  62 60  BILITOT  --  0.4  --  0.2* 0.6   ------------------------------------------------------------------------------------------------------------------ Recent Labs    01/27/20 1543  TRIG 143    No results found for: HGBA1C ------------------------------------------------------------------------------------------------------------------ No results for input(s): TSH, T4TOTAL, T3FREE, THYROIDAB in the last 72 hours.  Invalid input(s): FREET3 ------------------------------------------------------------------------------------------------------------------ Recent Labs    01/28/20 0554 01/29/20 0358  FERRITIN 400* 384*    Coagulation profile No results for input(s): INR, PROTIME in the last 168 hours.  Recent Labs    01/28/20 0554 01/29/20 0358  DDIMER 0.42  0.62*    Cardiac Enzymes No results for input(s): CKMB, TROPONINI, MYOGLOBIN in the last 168 hours.  Invalid input(s): CK ------------------------------------------------------------------------------------------------------------------ No results found for: BNP  Micro Results Recent Results (from the past 240 hour(s))  Blood Culture (routine x 2)     Status: None (Preliminary result)   Collection Time: 01/27/20  3:43 PM   Specimen: BLOOD  Result Value Ref Range Status   Specimen Description BLOOD RIGHT ANTECUBITAL  Final   Special Requests   Final    BOTTLES DRAWN AEROBIC AND ANAEROBIC Blood Culture adequate volume   Culture   Final    NO GROWTH 2 DAYS Performed at [facility], [address], [place], [place] [address]    Report Status PENDING  Incomplete  Blood Culture (routine x 2)  Status: None (Preliminary result)   Collection Time: 01/27/20  3:48 PM   Specimen: BLOOD  Result Value Ref Range Status   Specimen Description BLOOD LEFT ANTECUBITAL  Final   Special Requests   Final    BOTTLES DRAWN AEROBIC AND ANAEROBIC Blood Culture adequate volume   Culture   Final    NO GROWTH 2 DAYS Performed at [facility], [address], [place], [place] [address]    Report Status PENDING  Incomplete    Radiology Reports DG Chest 2 View  Result Date: 01/22/2020 CLINICAL DATA:  Worsening weakness. COVID-19 positive. EXAM: CHEST - 2 VIEW COMPARISON:  01/19/2020 FINDINGS: Normal sized heart. Clear lungs. Stable mild peribronchial thickening. Unremarkable bones. IMPRESSION: Stable mild bronchitic changes. No acute abnormality. Electronically Signed   By: Claudie Revering M.D.   On: 01/22/2020 13:13   CT Angio Chest PE W and/or Wo Contrast  Result Date: 01/27/2020 CLINICAL DATA:  Worsening shortness of breath. EXAM: CT ANGIOGRAPHY CHEST WITH CONTRAST TECHNIQUE: Multidetector CT imaging of the chest was performed using the standard protocol during bolus administration of  intravenous contrast. Multiplanar CT image reconstructions and MIPs were obtained to evaluate the vascular anatomy. CONTRAST:  153m OMNIPAQUE IOHEXOL 350 MG/ML SOLN COMPARISON:  None. FINDINGS: Cardiovascular: Satisfactory opacification of the pulmonary arteries to the segmental level. No evidence of pulmonary embolism. Normal heart size. No pericardial effusion. Mediastinum/Nodes: No enlarged mediastinal, hilar, or axillary lymph nodes. Thyroid gland, trachea, and esophagus demonstrate no significant findings. Lungs/Pleura: Moderate severity multifocal patchy infiltrates are seen throughout both lungs. There is no evidence of a pleural effusion or pneumothorax. Upper Abdomen: No acute abnormality. Musculoskeletal: No chest wall abnormality. No acute or significant osseous findings. Review of the MIP images confirms the above findings. IMPRESSION: 1. No evidence of pulmonary embolus. 2. Moderate severity multifocal patchy bilateral infiltrates. Electronically Signed   By: TVirgina NorfolkM.D.   On: 01/27/2020 17:22   DG Chest Portable 1 View  Result Date: 01/27/2020 CLINICAL DATA:  Shortness of breath, COVID positive. Additional provided: Worsening shortness of breath onset Wednesday, diagnosed with COVID May 11th. Cough. EXAM: PORTABLE CHEST 1 VIEW COMPARISON:  Chest radiograph 01/22/2020 FINDINGS: Heart size within normal limits. Interstitial opacities within the mid to lower right lung field and left lung base. Findings are consistent with atypical/viral pneumonia given provided history of COVID positivity. No evidence of pleural effusion or pneumothorax. No acute bony abnormality identified. IMPRESSION: Interstitial opacities with the mid to lower right lung field and left lung base. Findings are consistent with atypical/viral pneumonia given the provided history of COVID positivity. Electronically Signed   By: KKellie SimmeringDO   On: 01/27/2020 13:20   DG Chest Portable 1 View  Result Date:  01/19/2020 CLINICAL DATA:  Cough and aches, probable COVID EXAM: PORTABLE CHEST 1 VIEW COMPARISON:  10/19/2019 FINDINGS: Normal heart size and mediastinal contours. Low lung volumes. No acute infiltrate or edema. No effusion or pneumothorax. No acute osseous findings. IMPRESSION: Negative low volume chest. Electronically Signed   By: JMonte FantasiaM.D.   On: 01/19/2020 07:05

## 2020-01-29 NOTE — Telephone Encounter (Signed)
Pt stated that she has been admitted into [facility] due to colitis flare.  She reported that she was admitted from 11/08/19-11/22/19.  She works for [employer] and will have FMLA form faxed to you.  FYI.

## 2020-01-29 NOTE — Telephone Encounter (Signed)
Patient needs to send information to [employer].

## 2020-01-29 NOTE — Telephone Encounter (Signed)
Informed patient that we did receive the paperwork for her FMLA but it does have to go through our [employer] department first for FMLA. Informed patient that I gave the paperwork to [facility] in our department that will send it to [employer]. Patient verbalized understanding.

## 2020-01-29 NOTE — Telephone Encounter (Signed)
Patient sister Vida Roller is calling and wanted to let Baldo Ash know that she is faxing over [facility] paperwork for patient. CB is [phone]

## 2020-01-29 NOTE — Progress Notes (Deleted)
Pt D/C to home with family. Pt education, D/C paperwork, medication and IV removal completed prior to this shift.   Pt stable on RA upon D/C. Oxygen tank and personal belongings taken with pt.  Pt had no questions or concerns.  Pt transportrf by wheelchair.  Norton Blizzard, RN [phone]

## 2020-01-29 NOTE — Telephone Encounter (Signed)
Erika David pt sent over FMLA papers but shes currently in the hospital for Covid.  Pt need to make a virutal for this?

## 2020-01-29 NOTE — Telephone Encounter (Signed)
FMLA can not be completed while she is hospitalized. She will be to schedule hospital f/up appt after discharge

## 2020-01-30 ENCOUNTER — Inpatient Hospital Stay (HOSPITAL_COMMUNITY): Payer: BC Managed Care – PPO

## 2020-01-30 DIAGNOSIS — J9600 Acute respiratory failure, unspecified whether with hypoxia or hypercapnia: Secondary | ICD-10-CM | POA: Diagnosis not present

## 2020-01-30 DIAGNOSIS — U071 COVID-19: Secondary | ICD-10-CM | POA: Diagnosis not present

## 2020-01-30 DIAGNOSIS — R109 Unspecified abdominal pain: Secondary | ICD-10-CM | POA: Diagnosis not present

## 2020-01-30 LAB — COMPREHENSIVE METABOLIC PANEL
ALT: 31 U/L (ref 0–44)
AST: 23 U/L (ref 15–41)
Albumin: 3.2 g/dL — ABNORMAL LOW (ref 3.5–5.0)
Alkaline Phosphatase: 57 U/L (ref 38–126)
Anion gap: 11 (ref 5–15)
BUN: 13 mg/dL (ref 6–20)
CO2: 23 mmol/L (ref 22–32)
Calcium: 8.8 mg/dL — ABNORMAL LOW (ref 8.9–10.3)
Chloride: 107 mmol/L (ref 98–111)
Creatinine, Ser: 0.82 mg/dL (ref 0.44–1.00)
GFR calc Af Amer: >60 mL/min (ref >60)
GFR calc non Af Amer: >60 mL/min (ref >60)
Glucose, Bld: 109 mg/dL — ABNORMAL HIGH (ref 70–99)
Potassium: 3.5 mmol/L (ref 3.5–5.1)
Sodium: 141 mmol/L (ref 135–145)
Total Bilirubin: 0.4 mg/dL (ref 0.3–1.2)
Total Protein: 6.4 g/dL — ABNORMAL LOW (ref 6.5–8.1)

## 2020-01-30 LAB — CBC WITH DIFFERENTIAL/PLATELET
Abs Immature Granulocytes: 0.05 10*3/uL (ref 0.00–0.07)
Basophils Absolute: 0 10*3/uL (ref 0.0–0.1)
Basophils Relative: 0 %
Eosinophils Absolute: 0 10*3/uL (ref 0.0–0.5)
Eosinophils Relative: 0 %
HCT: 39.4 % (ref 36.0–46.0)
Hemoglobin: 12.6 g/dL (ref 12.0–15.0)
Immature Granulocytes: 1 %
Lymphocytes Relative: 25 %
Lymphs Abs: 1.4 10*3/uL (ref 0.7–4.0)
MCH: 27.5 pg (ref 26.0–34.0)
MCHC: 32 g/dL (ref 30.0–36.0)
MCV: 85.8 fL (ref 80.0–100.0)
Monocytes Absolute: 0.5 10*3/uL (ref 0.1–1.0)
Monocytes Relative: 9 %
Neutro Abs: 3.7 10*3/uL (ref 1.7–7.7)
Neutrophils Relative %: 65 %
Platelets: 231 10*3/uL (ref 150–400)
RBC: 4.59 MIL/uL (ref 3.87–5.11)
RDW: 19.1 % — ABNORMAL HIGH (ref 11.5–15.5)
WBC: 5.7 10*3/uL (ref 4.0–10.5)
nRBC: 0 % (ref 0.0–0.2)

## 2020-01-30 LAB — FERRITIN: Ferritin: 294 ng/mL (ref 11–307)

## 2020-01-30 LAB — C-REACTIVE PROTEIN: CRP: 1.4 mg/dL — ABNORMAL HIGH (ref <1.0)

## 2020-01-30 LAB — D-DIMER, QUANTITATIVE: D-Dimer, Quant: 0.35 ug/mL-FEU (ref 0.00–0.50)

## 2020-01-30 MED ORDER — POTASSIUM CHLORIDE CRYS ER 20 MEQ PO TBCR
40.0000 meq | EXTENDED_RELEASE_TABLET | Freq: Once | ORAL | Status: AC
  Administered 2020-01-30: 40 meq via ORAL
  Filled 2020-01-30: qty 2

## 2020-01-30 MED ORDER — IPRATROPIUM-ALBUTEROL 20-100 MCG/ACT IN AERS: 1.0000 | INHALATION_SPRAY | Freq: Four times a day (QID) | RESPIRATORY_TRACT | Status: DC | PRN

## 2020-01-30 NOTE — Evaluation (Signed)
Physical Therapy Evaluation Patient Details Name: BERDINE RASMUSSON MRN: [id number] DOB: 09-Nov-1975 Today's Date: 01/30/2020   History of Present Illness  44 y.o. female with PMHx of lymphocytic colitis, iron deficiency anemia from menorrhagia with COVID-19 on 5/11-presented with cough, worsening shortness of breath along with nausea vomiting and diarrhea.    Clinical Impression  Pt admitted with above diagnosis. PTA pt lived at home with her husband and 5 children. 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 On eval, she required supervision ambulation 150' without AD. Pt ambulated on RA with SpO2 91-93%. Max RR 40 with cues needed for deep breathing. Gait distance limited by abdominal pain. Pt currently with functional limitations due to the deficits listed below (see PT Problem List). Pt will benefit from skilled PT to increase their independence and safety with mobility to allow discharge to the venue listed below.  PT to follow acutely. No follow up services indicated.     Follow Up Recommendations No PT follow up    Equipment Recommendations  None recommended by PT    Recommendations for Other Services       Precautions / Restrictions Precautions Precautions: Other (comment) Precaution Comments: watch RR and O2      Mobility  Bed Mobility Overal bed mobility: Independent                Transfers Overall transfer level: Independent Equipment used: None                Ambulation/Gait Ambulation/Gait assistance: Supervision Gait Distance (Feet): 150 Feet Assistive device: None Gait Pattern/deviations: Step-through pattern;Decreased stride length Gait velocity: very slow Gait velocity interpretation: <1.31 ft/sec, indicative of household ambulator General Gait Details: Pt ambulated on RA with SpO2 91-93%. RR increased to 40 with pt taking short, shallow breaths. Cues needed to slow rate and take deeper breaths bringing rate to 25. Distance limited  by abdominal pain.  Stairs            Wheelchair Mobility    Modified Rankin (Stroke Patients Only)       Balance Overall balance assessment: No apparent balance deficits (not formally assessed)                                           Pertinent Vitals/Pain Pain Assessment: 0-10 Pain Score: 7  Pain Location: abdomen Pain Descriptors / Indicators: Sore;Cramping Pain Intervention(s): Monitored during session;Limited activity within patient's tolerance;Repositioned    Home Living Family/patient expects to be discharged to:: Private residence Living Arrangements: Spouse/significant other;Children Available Help at Discharge: Family;Available 24 hours/day Type of Home: House         Home Equipment: None      Prior Function Level of Independence: Independent         Comments: 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 She has 5 children age 54 to 85.     Hand Dominance        Extremity/Trunk Assessment   Upper Extremity Assessment Upper Extremity Assessment: Overall WFL for tasks assessed    Lower Extremity Assessment Lower Extremity Assessment: Overall WFL for tasks assessed    Cervical / Trunk Assessment Cervical / Trunk Assessment: Normal  Communication   Communication: No difficulties  Cognition Arousal/Alertness: Awake/alert Behavior During Therapy: WFL for tasks assessed/performed Overall Cognitive Status: Within Functional Limits for tasks assessed  General Comments      Exercises     Assessment/Plan    PT Assessment Patient needs continued PT services  PT Problem List Decreased mobility;Pain;Decreased activity tolerance;Cardiopulmonary status limiting activity       PT Treatment Interventions Therapeutic activities;Gait training;Therapeutic exercise;Patient/family education;Stair training;Balance training;Functional mobility training    PT Goals  (Current goals can be found in the Care Plan section)  Acute Rehab PT Goals Patient Stated Goal: decrease pain PT Goal Formulation: With patient Time For Goal Achievement: 02/13/20 Potential to Achieve Goals: Good    Frequency Min 3X/week   Barriers to discharge        Co-evaluation               AM-PAC PT "6 Clicks" Mobility  Outcome Measure Help needed turning from your back to your side while in a flat bed without using bedrails?: None Help needed moving from lying on your back to sitting on the side of a flat bed without using bedrails?: None Help needed moving to and from a bed to a chair (including a wheelchair)?: None Help needed standing up from a chair using your arms (e.g., wheelchair or bedside chair)?: None Help needed to walk in hospital room?: A Little Help needed climbing 3-5 steps with a railing? : A Little 6 Click Score: 22    End of Session   Activity Tolerance: Patient limited by pain Patient left: in bed;with call bell/phone within reach Nurse Communication: Mobility status PT Visit Diagnosis: Difficulty in walking, not elsewhere classified (R26.2);Pain    Time: 1258-1310 PT Time Calculation (min) (ACUTE ONLY): 12 min   Charges:   PT Evaluation $PT Eval Moderate Complexity: 1 Mod          Lorrin Goodell, PT  Office # [phone] Pager [phone]   Lorriane Shire 01/30/2020, 2:08 PM

## 2020-01-30 NOTE — Progress Notes (Signed)
PROGRESS NOTE                                                                                                                                                                                                             Patient Demographics:    Erika David, is a 44 y.o. female, DOB - 02/12/1976, LID:[id number]  Outpatient Primary MD for the patient is Nche, Charlene Brooke, NP   Admit date - 01/27/2020   LOS - 3  Chief Complaint  Patient presents with  . Shortness of Breath       Brief Narrative: Patient is a 44 y.o. female with PMHx of lymphocytic colitis, iron deficiency anemia from menorrhagia with COVID-19 on 5/11-presented with cough, worsening shortness of breath along with nausea vomiting and diarrhea.  Significant Events: 5/19>> admit to [facility] for COVID-19 pneumonia  COVID-19 medications: Steroids: 5/19>> Remdesivir: 5/19>> Actemra: 5/19 x 1 by admitting MD  Antibiotics: None  Microbiology data: 5/19: Blood cultures>> negative so far  Significant studies: 5/19: CTA chest: No PE, moderate multifocal patchy infiltrates  DVT prophylaxis: SQ Lovenox  Procedures: None  Consults: None    Subjective:   Patient in bed, appears comfortable, denies any headache, no fever, no chest pain or pressure, no shortness of breath , no abdominal pain. No focal weakness. She is stable currently on room air.    Assessment  & Plan :   Acute Hypoxic Resp Failure due to Covid 19 Viral pneumonia: Improved-titrated to room air earlier this morning.  CRP downtrending-continue steroids/remdesivir.  Follow clinical trajectory/inflammatory markers.    Fever: afebrile Prone/Incentive Spirometry: encouraged incentive spirometry use 3-4/hour. O2 requirements:     COVID-19 Labs: Recent Labs    01/27/20 1543 01/27/20 1543 01/28/20 0554 01/29/20 0358 01/30/20 0715  DDIMER 0.38   < > 0.42 0.62* 0.35  FERRITIN 378*   < >  400* 384* 294  LDH 259*  --   --   --   --   CRP 8.8*   < > 7.7* 3.4* 1.4*   < > = values in this interval not displayed.    No results found for: BNP  Recent Labs  Lab 01/27/20 1543  PROCALCITON <0.10    Lab Results  Component Value Date   SARSCOV2NAA POSITIVE (A) 01/19/2020   [place] NEGATIVE 11/08/2019   SARSCOV2NAA NOT DETECTED  10/19/2019   [place] NEGATIVE 06/18/2019    Hypokalemia: Repleted  Lymphocytic colitis: Likely having some intermittent diarrhea secondary to this rather than Covid-after discussion with pharmacy-have resumed budesonide-as this is mostly locally acting.  Hypokalemia: Replete and recheck  History of iron deficiency anemia: Hemoglobin currently stable-continue to follow  Morbid Obesity: Estimated body mass index is 34.53 kg/m as calculated from the following:   Height as of this encounter: 5' 1"  (1.549 m).   Weight as of this encounter: 82.9 kg.      Condition -Stable  Family Communication  : Patient will update her family-I have asked her to let me know if they have questions for me.  Code Status :  Full Code  Diet :  Diet Order            Diet regular Room service appropriate? Yes; Fluid consistency: Thin  Diet effective now               Disposition Plan  :   Status is: Inpatient  Remains inpatient appropriate because:Inpatient level of care appropriate due to severity of illness  Dispo: The patient is from: Home              Anticipated d/c is to: Home              Anticipated d/c date is: 1 days              Patient currently is not medically stable to d/c. Will finish her remdesivir course on 01/31/2020 and then will be discharged home    Antimicorbials  :    Anti-infectives (From admission, onward)   Start     Dose/Rate Route Frequency Ordered Stop   01/28/20 1000  remdesivir 100 mg in sodium chloride 0.9 % 100 mL IVPB     100 mg 200 mL/hr over 30 Minutes Intravenous Daily 01/27/20 1748 02/01/20 0959    01/28/20 1000  remdesivir 100 mg in sodium chloride 0.9 % 100 mL IVPB  Status:  Discontinued     100 mg 200 mL/hr over 30 Minutes Intravenous Daily 01/27/20 1915 01/27/20 1917   01/27/20 1914  remdesivir 200 mg in sodium chloride 0.9% 250 mL IVPB  Status:  Discontinued     200 mg 580 mL/hr over 30 Minutes Intravenous Once 01/27/20 1915 01/27/20 1917   01/27/20 1800  remdesivir 200 mg in sodium chloride 0.9% 250 mL IVPB     200 mg 580 mL/hr over 30 Minutes Intravenous Once 01/27/20 1748 01/27/20 2205      Inpatient Medications  Scheduled Meds: . vitamin C  500 mg Oral Daily  . budesonide  3 mg Oral Daily  . dexamethasone (DECADRON) injection  6 mg Intravenous Q24H  . dicyclomine  10 mg Oral TID AC & HS  . enoxaparin (LOVENOX) injection  40 mg Subcutaneous Q24H  . feeding supplement (ENSURE ENLIVE)  237 mL Oral BID BM  . Ipratropium-Albuterol  1 puff Inhalation Q6H  . melatonin  6 mg Oral QHS  . zinc sulfate  220 mg Oral Daily   Continuous Infusions: . remdesivir 100 mg in NS 100 mL 100 mg (01/30/20 1006)   PRN Meds:.acetaminophen, chlorpheniramine-HYDROcodone, guaiFENesin-dextromethorphan, loperamide, ondansetron **OR** ondansetron (ZOFRAN) IV   Time Spent in minutes  25  See all Orders from today for further details   Lala Lund M.D on 01/30/2020 at 10:16 AM  To page go to [email or web address] - use universal password  [facility] -  Office  [phone]  Objective:   Vitals:   01/29/20 2050 01/30/20 0219 01/30/20 0500 01/30/20 0530  BP: 136/87  126/76   Pulse: 91 71 69 70  Resp: 18 19 18 19   Temp: 98.6 F (37 C)  98.8 F (37.1 C)   TempSrc: Oral  Oral   SpO2: 93% 92% 93% 91%  Weight:      Height:        Wt Readings from Last 3 Encounters:  01/27/20 82.9 kg  01/22/20 86.6 kg  01/19/20 86.6 kg     Intake/Output Summary (Last 24 hours) at 01/30/2020 1016 Last data filed at 01/29/2020 1848 Gross per 24 hour  Intake 750 ml  Output --  Net 750 ml      Physical Exam  Awake Alert, No new F.N deficits, Normal affect [place].AT,PERRAL Supple Neck,No JVD, No cervical lymphadenopathy appriciated.  Symmetrical Chest wall movement, Good air movement bilaterally, CTAB RRR,No Gallops, Rubs or new Murmurs, No Parasternal Heave +ve B.Sounds, Abd Soft, No tenderness, No organomegaly appriciated, No rebound - guarding or rigidity. No Cyanosis, Clubbing or edema, No new Rash or bruise    Data Review:    CBC Recent Labs  Lab 01/27/20 1306 01/27/20 1926 01/28/20 0554 01/29/20 0358 01/30/20 0715  WBC 4.7 5.3 2.3* 4.1 5.7  HGB 14.3 13.4 13.1 13.0 12.6  HCT 42.5 40.9 39.4 39.5 39.4  PLT 209 186 200 227 231  MCV 82.4 83.3 83.8 84.4 85.8  MCH 27.7 27.3 27.9 27.8 27.5  MCHC 33.6 32.8 33.2 32.9 32.0  RDW 18.7* 18.8* 19.0* 19.2* 19.1*  LYMPHSABS 0.9  --  0.3* 1.0 1.4  MONOABS 0.3  --  0.1 0.4 0.5  EOSABS 0.0  --  0.0 0.0 0.0  BASOSABS 0.0  --  0.0 0.0 0.0    Chemistries  Recent Labs  Lab 01/27/20 1306 01/27/20 1926 01/28/20 0554 01/29/20 0358 01/30/20 0715  NA 137  --  139 143 141  K 3.1*  --  3.7 3.4* 3.5  CL 101  --  104 107 107  CO2 25  --  23 24 23   GLUCOSE 111*  --  140* 122* 109*  BUN <5*  --  7 11 13   CREATININE 0.81 0.80 0.80 0.83 0.82  CALCIUM 8.8*  --  8.5* 8.9 8.8*  AST 33  --  39 36 23  ALT 26  --  31 38 31  ALKPHOS 72  --  62 60 57  BILITOT 0.4  --  0.2* 0.6 0.4   ------------------------------------------------------------------------------------------------------------------ Recent Labs    01/27/20 1543  TRIG 143    No results found for: HGBA1C ------------------------------------------------------------------------------------------------------------------ No results for input(s): TSH, T4TOTAL, T3FREE, THYROIDAB in the last 72 hours.  Invalid input(s): FREET3 ------------------------------------------------------------------------------------------------------------------ Recent Labs     01/29/20 0358 01/30/20 0715  FERRITIN 384* 294    Coagulation profile No results for input(s): INR, PROTIME in the last 168 hours.  Recent Labs    01/29/20 0358 01/30/20 0715  DDIMER 0.62* 0.35    Cardiac Enzymes No results for input(s): CKMB, TROPONINI, MYOGLOBIN in the last 168 hours.  Invalid input(s): CK ------------------------------------------------------------------------------------------------------------------ No results found for: BNP  Micro Results Recent Results (from the past 240 hour(s))  Blood Culture (routine x 2)     Status: None (Preliminary result)   Collection Time: 01/27/20  3:43 PM   Specimen: BLOOD  Result Value Ref Range Status   Specimen Description BLOOD RIGHT ANTECUBITAL  Final   Special Requests   Final  BOTTLES DRAWN AEROBIC AND ANAEROBIC Blood Culture adequate volume   Culture   Final    NO GROWTH 3 DAYS Performed at [facility], [place] [address], [place], [place] [address]    Report Status PENDING  Incomplete  Blood Culture (routine x 2)     Status: None (Preliminary result)   Collection Time: 01/27/20  3:48 PM   Specimen: BLOOD  Result Value Ref Range Status   Specimen Description BLOOD LEFT ANTECUBITAL  Final   Special Requests   Final    BOTTLES DRAWN AEROBIC AND ANAEROBIC Blood Culture adequate volume   Culture   Final    NO GROWTH 3 DAYS Performed at [facility], [place] [address] [place], [place] [address]    Report Status PENDING  Incomplete    Radiology Reports DG Chest 2 View  Result Date: 01/22/2020 CLINICAL DATA:  Worsening weakness. COVID-19 positive. EXAM: CHEST - 2 VIEW COMPARISON:  01/19/2020 FINDINGS: Normal sized heart. Clear lungs. Stable mild peribronchial thickening. Unremarkable bones. IMPRESSION: Stable mild bronchitic changes. No acute abnormality. Electronically Signed   By: Claudie Revering M.D.   On: 01/22/2020 13:13   CT Angio Chest PE W and/or Wo Contrast  Result Date: 01/27/2020 CLINICAL  DATA:  Worsening shortness of breath. EXAM: CT ANGIOGRAPHY CHEST WITH CONTRAST TECHNIQUE: Multidetector CT imaging of the chest was performed using the standard protocol during bolus administration of intravenous contrast. Multiplanar CT image reconstructions and MIPs were obtained to evaluate the vascular anatomy. CONTRAST:  190m OMNIPAQUE IOHEXOL 350 MG/ML SOLN COMPARISON:  None. FINDINGS: Cardiovascular: Satisfactory opacification of the pulmonary arteries to the segmental level. No evidence of pulmonary embolism. Normal heart size. No pericardial effusion. Mediastinum/Nodes: No enlarged mediastinal, hilar, or axillary lymph nodes. Thyroid gland, trachea, and esophagus demonstrate no significant findings. Lungs/Pleura: Moderate severity multifocal patchy infiltrates are seen throughout both lungs. There is no evidence of a pleural effusion or pneumothorax. Upper Abdomen: No acute abnormality. Musculoskeletal: No chest wall abnormality. No acute or significant osseous findings. Review of the MIP images confirms the above findings. IMPRESSION: 1. No evidence of pulmonary embolus. 2. Moderate severity multifocal patchy bilateral infiltrates. Electronically Signed   By: TVirgina NorfolkM.D.   On: 01/27/2020 17:22   DG Chest Portable 1 View  Result Date: 01/27/2020 CLINICAL DATA:  Shortness of breath, COVID positive. Additional provided: Worsening shortness of breath onset Wednesday, diagnosed with COVID May 11th. Cough. EXAM: PORTABLE CHEST 1 VIEW COMPARISON:  Chest radiograph 01/22/2020 FINDINGS: Heart size within normal limits. Interstitial opacities within the mid to lower right lung field and left lung base. Findings are consistent with atypical/viral pneumonia given provided history of COVID positivity. No evidence of pleural effusion or pneumothorax. No acute bony abnormality identified. IMPRESSION: Interstitial opacities with the mid to lower right lung field and left lung base. Findings are consistent  with atypical/viral pneumonia given the provided history of COVID positivity. Electronically Signed   By: KKellie SimmeringDO   On: 01/27/2020 13:20   DG Chest Portable 1 View  Result Date: 01/19/2020 CLINICAL DATA:  Cough and aches, probable COVID EXAM: PORTABLE CHEST 1 VIEW COMPARISON:  10/19/2019 FINDINGS: Normal heart size and mediastinal contours. Low lung volumes. No acute infiltrate or edema. No effusion or pneumothorax. No acute osseous findings. IMPRESSION: Negative low volume chest. Electronically Signed   By: JMonte FantasiaM.D.   On: 01/19/2020 07:05

## 2020-01-31 LAB — COMPREHENSIVE METABOLIC PANEL
ALT: 32 U/L (ref 0–44)
AST: 20 U/L (ref 15–41)
Albumin: 3.2 g/dL — ABNORMAL LOW (ref 3.5–5.0)
Alkaline Phosphatase: 57 U/L (ref 38–126)
Anion gap: 10 (ref 5–15)
BUN: 12 mg/dL (ref 6–20)
CO2: 26 mmol/L (ref 22–32)
Calcium: 9 mg/dL (ref 8.9–10.3)
Chloride: 108 mmol/L (ref 98–111)
Creatinine, Ser: 0.77 mg/dL (ref 0.44–1.00)
GFR calc Af Amer: >60 mL/min (ref >60)
GFR calc non Af Amer: >60 mL/min (ref >60)
Glucose, Bld: 113 mg/dL — ABNORMAL HIGH (ref 70–99)
Potassium: 3.8 mmol/L (ref 3.5–5.1)
Sodium: 144 mmol/L (ref 135–145)
Total Bilirubin: 0.6 mg/dL (ref 0.3–1.2)
Total Protein: 6.2 g/dL — ABNORMAL LOW (ref 6.5–8.1)

## 2020-01-31 LAB — CBC WITH DIFFERENTIAL/PLATELET
Abs Immature Granulocytes: 0.08 10*3/uL — ABNORMAL HIGH (ref 0.00–0.07)
Basophils Absolute: 0 10*3/uL (ref 0.0–0.1)
Basophils Relative: 0 %
Eosinophils Absolute: 0 10*3/uL (ref 0.0–0.5)
Eosinophils Relative: 0 %
HCT: 38.3 % (ref 36.0–46.0)
Hemoglobin: 12.5 g/dL (ref 12.0–15.0)
Immature Granulocytes: 1 %
Lymphocytes Relative: 25 %
Lymphs Abs: 1.4 10*3/uL (ref 0.7–4.0)
MCH: 27.3 pg (ref 26.0–34.0)
MCHC: 32.6 g/dL (ref 30.0–36.0)
MCV: 83.6 fL (ref 80.0–100.0)
Monocytes Absolute: 0.5 10*3/uL (ref 0.1–1.0)
Monocytes Relative: 8 %
Neutro Abs: 3.9 10*3/uL (ref 1.7–7.7)
Neutrophils Relative %: 66 %
Platelets: 285 10*3/uL (ref 150–400)
RBC: 4.58 MIL/uL (ref 3.87–5.11)
RDW: 19 % — ABNORMAL HIGH (ref 11.5–15.5)
WBC: 5.9 10*3/uL (ref 4.0–10.5)
nRBC: 0 % (ref 0.0–0.2)

## 2020-01-31 LAB — D-DIMER, QUANTITATIVE: D-Dimer, Quant: 0.5 ug/mL-FEU (ref 0.00–0.50)

## 2020-01-31 LAB — FERRITIN: Ferritin: 245 ng/mL (ref 11–307)

## 2020-01-31 LAB — C-REACTIVE PROTEIN: CRP: 1.1 mg/dL — ABNORMAL HIGH (ref <1.0)

## 2020-01-31 MED ORDER — ONDANSETRON HCL 4 MG PO TABS: 4.0000 mg | ORAL_TABLET | Freq: Three times a day (TID) | ORAL | 0 refills | Status: DC | PRN

## 2020-01-31 MED ORDER — GUAIFENESIN-DM 100-10 MG/5ML PO SYRP
5.0000 mL | ORAL_SOLUTION | Freq: Three times a day (TID) | ORAL | 0 refills | Status: DC | PRN
Start: 2020-01-31 — End: 2020-04-19

## 2020-01-31 MED ORDER — LOPERAMIDE HCL 2 MG PO CAPS: 2.0000 mg | ORAL_CAPSULE | Freq: Three times a day (TID) | ORAL | 0 refills | Status: DC | PRN

## 2020-01-31 NOTE — Progress Notes (Signed)
Erika David to be D/C'd Home per MD order.  Discussed with the patient and all questions fully answered.  VSS, Skin clean, dry and intact without evidence of skin break down, no evidence of skin tears noted. IV catheter discontinued intact. Site without signs and symptoms of complications. Dressing and pressure applied.  An After Visit Summary was printed and given to the patient. Patient received prescription.  D/c education completed with patient/family including follow up instructions, medication list, d/c activities limitations if indicated, with other d/c instructions as indicated by MD - patient able to verbalize understanding, all questions fully answered.   Patient instructed to return to ED, call 911, or call MD for any changes in condition.   Patient escorted via [place], and D/C home via private auto.   Erika David A [name] 01/31/2020 4:20 PM.

## 2020-01-31 NOTE — Discharge Instructions (Signed)
Follow with Primary MD Nche, Charlene Brooke, NP in 7 days   Get CBC, CMP, 2 view Chest X ray -  checked next visit within 1 week by Primary MD    Activity: As tolerated with Full fall precautions use walker/cane & assistance as needed  Disposition Home    Diet: Heart Healthy     Special Instructions: If you have smoked or chewed Tobacco  in the last 2 yrs please stop smoking, stop any regular Alcohol  and or any Recreational drug use.  On your next visit with your primary care physician please Get Medicines reviewed and adjusted.  Please request your Prim.MD to go over all Hospital Tests and Procedure/Radiological results at the follow up, please get all Hospital records sent to your Prim MD by signing hospital release before you go home.  If you experience worsening of your admission symptoms, develop shortness of breath, life threatening emergency, suicidal or homicidal thoughts you must seek medical attention immediately by calling 911 or calling your MD immediately  if symptoms less severe.  You Must read complete instructions/literature along with all the possible adverse reactions/side effects for all the Medicines you take and that have been prescribed to you. Take any new Medicines after you have completely understood and accpet all the possible adverse reactions/side effects.         Person Under Monitoring Name: Erika David  Location: [place]  [id number]   Infection Prevention Recommendations for Individuals Confirmed to have, or Being Evaluated for, 2019 Novel Coronavirus (COVID-19) Infection Who Receive Care at Home  Individuals who are confirmed to have, or are being evaluated for, COVID-19 should follow the prevention steps below until a healthcare provider or local or state health department says they can return to normal activities.  Stay home except to get medical care You should restrict activities outside your home, except for getting medical  care. Do not go to work, school, or public areas, and do not use public transportation or taxis.  Call ahead before visiting your doctor Before your medical appointment, call the healthcare provider and tell them that you have, or are being evaluated for, COVID-19 infection. This will help the healthcare provider's office take steps to keep other people from getting infected. Ask your healthcare provider to call the local or state health department.  Monitor your symptoms Seek prompt medical attention if your illness is worsening (e.g., difficulty breathing). Before going to your medical appointment, call the healthcare provider and tell them that you have, or are being evaluated for, COVID-19 infection. Ask your healthcare provider to call the local or state health department.  Wear a facemask You should wear a facemask that covers your nose and mouth when you are in the same room with other people and when you visit a healthcare provider. People who live with or visit you should also wear a facemask while they are in the same room with you.  Separate yourself from other people in your home As much as possible, you should stay in a different room from other people in your home. Also, you should use a separate bathroom, if available.  Avoid sharing household items You should not share dishes, drinking glasses, cups, eating utensils, towels, bedding, or other items with other people in your home. After using these items, you should wash them thoroughly with soap and water.  Cover your coughs and sneezes Cover your mouth and nose with a tissue when you cough or sneeze, or  you can cough or sneeze into your sleeve. Throw used tissues in a lined trash can, and immediately wash your hands with soap and water for at least 20 seconds or use an alcohol-based hand rub.  Wash your [facility] your hands often and thoroughly with soap and water for at least 20 seconds. You can use an alcohol-based  hand sanitizer if soap and water are not available and if your hands are not visibly dirty. Avoid touching your eyes, nose, and mouth with unwashed hands.   Prevention Steps for Caregivers and Household Members of Individuals Confirmed to have, or Being Evaluated for, COVID-19 Infection Being Cared for in the Home  If you live with, or provide care at home for, a person confirmed to have, or being evaluated for, COVID-19 infection please follow these guidelines to prevent infection:  Follow healthcare provider's instructions Make sure that you understand and can help the patient follow any healthcare provider instructions for all care.  Provide for the patient's basic needs You should help the patient with basic needs in the home and provide support for getting groceries, prescriptions, and other personal needs.  Monitor the patient's symptoms If they are getting sicker, call his or her medical provider and tell them that the patient has, or is being evaluated for, COVID-19 infection. This will help the healthcare provider's office take steps to keep other people from getting infected. Ask the healthcare provider to call the local or state health department.  Limit the number of people who have contact with the patient  If possible, have only one caregiver for the patient.  Other household members should stay in another home or place of residence. If this is not possible, they should stay  in another room, or be separated from the patient as much as possible. Use a separate bathroom, if available.  Restrict visitors who do not have an essential need to be in the home.  Keep older adults, very young children, and other sick people away from the patient Keep older adults, very young children, and those who have compromised immune systems or chronic health conditions away from the patient. This includes people with chronic heart, lung, or kidney conditions, diabetes, and  cancer.  Ensure good ventilation Make sure that shared spaces in the home have good air flow, such as from an air conditioner or an opened window, weather permitting.  Wash your hands often  Wash your hands often and thoroughly with soap and water for at least 20 seconds. You can use an alcohol based hand sanitizer if soap and water are not available and if your hands are not visibly dirty.  Avoid touching your eyes, nose, and mouth with unwashed hands.  Use disposable paper towels to dry your hands. If not available, use dedicated cloth towels and replace them when they become wet.  Wear a facemask and gloves  Wear a disposable facemask at all times in the room and gloves when you touch or have contact with the patient's blood, body fluids, and/or secretions or excretions, such as sweat, saliva, sputum, nasal mucus, vomit, urine, or feces.  Ensure the mask fits over your nose and mouth tightly, and do not touch it during use.  Throw out disposable facemasks and gloves after using them. Do not reuse.  Wash your hands immediately after removing your facemask and gloves.  If your personal clothing becomes contaminated, carefully remove clothing and launder. Wash your hands after handling contaminated clothing.  Place all used disposable facemasks,  gloves, and other waste in a lined container before disposing them with other household waste.  Remove gloves and wash your hands immediately after handling these items.  Do not share dishes, glasses, or other household items with the patient  Avoid sharing household items. You should not share dishes, drinking glasses, cups, eating utensils, towels, bedding, or other items with a patient who is confirmed to have, or being evaluated for, COVID-19 infection.  After the person uses these items, you should wash them thoroughly with soap and water.  Wash laundry thoroughly  Immediately remove and wash clothes or bedding that have blood, body  fluids, and/or secretions or excretions, such as sweat, saliva, sputum, nasal mucus, vomit, urine, or feces, on them.  Wear gloves when handling laundry from the patient.  Read and follow directions on labels of laundry or clothing items and detergent. In general, wash and dry with the warmest temperatures recommended on the label.  Clean all areas the individual has used often  Clean all touchable surfaces, such as counters, tabletops, doorknobs, bathroom fixtures, toilets, phones, keyboards, tablets, and bedside tables, every day. Also, clean any surfaces that may have blood, body fluids, and/or secretions or excretions on them.  Wear gloves when cleaning surfaces the patient has come in contact with.  Use a diluted bleach solution (e.g., dilute bleach with 1 part bleach and 10 parts water) or a household disinfectant with a label that says EPA-registered for coronaviruses. To make a bleach solution at home, add 1 tablespoon of bleach to 1 quart (4 cups) of water. For a larger supply, add  cup of bleach to 1 gallon (16 cups) of water.  Read labels of cleaning products and follow recommendations provided on product labels. Labels contain instructions for safe and effective use of the cleaning product including precautions you should take when applying the product, such as wearing gloves or eye protection and making sure you have good ventilation during use of the product.  Remove gloves and wash hands immediately after cleaning.  Monitor yourself for signs and symptoms of illness Caregivers and household members are considered close contacts, should monitor their health, and will be asked to limit movement outside of the home to the extent possible. Follow the monitoring steps for close contacts listed on the symptom monitoring form.   ? If you have additional questions, contact your local health department or call the epidemiologist on call at [phone] (available 24/7). ? This  guidance is subject to change. For the most up-to-date guidance from [facility], please refer to their website: YouBlogs.pl

## 2020-01-31 NOTE — Progress Notes (Signed)
                                      [facility]                            [place], [place] [address]      Cacie Gaskins was admitted to the Hospital on 01/27/2020 and Discharged  01/31/2020 and should be excused from work/school   for 14 days starting from date -  01/27/2020 , may return to work/school without any restrictions.  Call Lala Lund MD, [facility]  [phone] with questions.  Lala Lund M.D on 01/31/2020,at 1:38 PM  [facility]   Office  [phone]

## 2020-01-31 NOTE — Discharge Summary (Addendum)
Erika David UEK:[id number] DOB: [birth year] DOA: 01/27/2020  PCP: Flossie Buffy, NP  Admit date: 01/27/2020  Discharge date: 01/31/2020  Admitted From: Home Disposition:  Home   Recommendations for Outpatient Follow-up:   Follow up with PCP in 1-2 weeks  PCP Please obtain BMP/CBC, 2 view CXR in 1week,  (see Discharge instructions)   PCP Please follow up on the following pending results:    Home Health: None   Equipment/Devices: None  Consultations: None  Discharge Condition: Stable    CODE STATUS: Full    Diet Recommendation: Heart Healthy     Chief Complaint  Patient presents with   Shortness of Breath     Brief history of present illness from the day of admission and additional interim summary    Patient is a 44 y.o. female with PMHx of lymphocytic colitis, iron deficiency anemia from menorrhagia with COVID-19 on 5/11-presented with cough, worsening shortness of breath along with nausea vomiting and diarrhea.  Significant Events: 5/19>> admit to [facility] for COVID-19 pneumonia  COVID-19 medications: Steroids: 5/19>> Remdesivir: 5/19>> Actemra: 5/19 x 1 by admitting MD  Antibiotics: None  Microbiology data: 5/19: Blood cultures>> negative so far  Significant studies: 5/19: CTA chest: No PE, moderate multifocal patchy infiltrates                                                                 Hospital Course   Acute Hypoxic Resp Failure due to Covid 19 Viral pneumonia: Improved-titrated to room air for the last 72 hours.    Maitri markers have stabilized and completely symptom-free except for mild cough from the standpoint, will be finishing her remdesivir and steroid course today thereafter discharged home with PCP follow-up.  Patient reluctant to go home as she thinks her family  members who are also COVID-19 positive can reinfect her, she was counseled about this. Note patient was offered outpt Rx 3 days ago as she was mentally not prepared, also updated her sister.   Recent Labs  Lab 01/27/20 1543 01/28/20 0554 01/29/20 0358 01/30/20 0715 01/31/20 0328  CRP 8.8* 7.7* 3.4* 1.4* 1.1*  DDIMER 0.38 0.42 0.62* 0.35 0.50  FERRITIN 378* 400* 384* 294 245  PROCALCITON <0.10  --   --   --   --     Hepatic Function Latest Ref Rng & Units 01/31/2020 01/30/2020 01/29/2020  Total Protein 6.5 - 8.1 g/dL 6.2(L) 6.4(L) 6.6  Albumin 3.5 - 5.0 g/dL 3.2(L) 3.2(L) 3.3(L)  AST 15 - 41 U/L 20 23 36  ALT 0 - 44 U/L 32 31 38  Alk Phosphatase 38 - 126 U/L 57 57 60  Total Bilirubin 0.3 - 1.2 mg/dL 0.6 0.4 0.6    Lymphocytic colitis: Mild intermittent diarrhea which is chronic, no BM in the last 14 hours,  abdominal exam and x-ray completely benign, no nausea vomiting tolerating diet, given as needed Zofran and refilled as needed Imodium.  Follow with PCP and primary GI MD.  No signs of acute decompensation.  CRP is 1.1.  Hypokalemia: Replaced and stable.  History of iron deficiency anemia: Hemoglobin currently stable-continue to follow  Obesity.  BMI of 34 follow with PCP.   Discharge diagnosis     Principal Problem:   Acute respiratory failure due to COVID-19 [facility]) Active Problems:   Overweight   Iron deficiency anemia due to chronic blood loss   Hypokalemia    Discharge instructions    Discharge Instructions    Diet - low sodium heart healthy   Complete by: As directed    Discharge instructions   Complete by: As directed    Follow with Primary MD Nche, Charlene Brooke, NP in 7 days   Get CBC, CMP, 2 view Chest X ray -  checked next visit within 1 week by Primary MD    Activity: As tolerated with Full fall precautions use walker/cane & assistance as needed  Disposition Home    Diet: Heart Healthy     Special Instructions: If you have smoked or chewed  Tobacco  in the last 2 yrs please stop smoking, stop any regular Alcohol  and or any Recreational drug use.  On your next visit with your primary care physician please Get Medicines reviewed and adjusted.  Please request your Prim.MD to go over all Hospital Tests and Procedure/Radiological results at the follow up, please get all Hospital records sent to your Prim MD by signing hospital release before you go home.  If you experience worsening of your admission symptoms, develop shortness of breath, life threatening emergency, suicidal or homicidal thoughts you must seek medical attention immediately by calling 911 or calling your MD immediately  if symptoms less severe.  You Must read complete instructions/literature along with all the possible adverse reactions/side effects for all the Medicines you take and that have been prescribed to you. Take any new Medicines after you have completely understood and accpet all the possible adverse reactions/side effects.   Increase activity slowly   Complete by: As directed    MyChart COVID-19 home monitoring program   Complete by: Jan 31, 2020    Is the patient willing to use the [place] for home monitoring?: Yes   Temperature monitoring   Complete by: Jan 31, 2020    After how many days would you like to receive a notification of this patient's flowsheet entries?: 1      Discharge Medications   Allergies as of 01/31/2020      Reactions   Dust Mite Extract Anaphylaxis   Kiwi Extract Itching, Swelling   Pollen Extract Anaphylaxis   Tree pollen in environment      Medication List    TAKE these medications   albuterol 108 (90 Base) MCG/ACT inhaler Commonly known as: VENTOLIN HFA Inhale 2 puffs into the lungs every 6 (six) hours as needed for wheezing or shortness of breath.   budesonide 3 MG 24 hr capsule Commonly known as: ENTOCORT EC Take 1 capsule (3 mg total) by mouth daily.   cetirizine 10 MG chewable tablet Commonly known  as: ZYRTEC Chew 10 mg by mouth daily.   dicyclomine 10 MG capsule Commonly known as: BENTYL Take 1 capsule (10 mg total) by mouth 4 (four) times daily -  before meals and at bedtime.   guaiFENesin-dextromethorphan 100-10 MG/5ML syrup  Commonly known as: ROBITUSSIN DM Take 5 mLs by mouth every 8 (eight) hours as needed for cough.   loperamide 2 MG capsule Commonly known as: IMODIUM Take 1 capsule (2 mg total) by mouth 3 (three) times daily as needed for diarrhea or loose stools. What changed: when to take this   ondansetron 4 MG tablet Commonly known as: Zofran Take 1 tablet (4 mg total) by mouth every 8 (eight) hours as needed for nausea or vomiting.       Follow-up Information    Nche, Charlene Brooke, NP. Schedule an appointment as soon as possible for a visit in 1 week(s).   Specialty: Internal Medicine Contact information: [address] [place] [address] [phone]           Major procedures and Radiology Reports - PLEASE review detailed and final reports thoroughly  -        DG Chest 2 View  Result Date: 01/22/2020 CLINICAL DATA:  Worsening weakness. COVID-19 positive. EXAM: CHEST - 2 VIEW COMPARISON:  01/19/2020 FINDINGS: Normal sized heart. Clear lungs. Stable mild peribronchial thickening. Unremarkable bones. IMPRESSION: Stable mild bronchitic changes. No acute abnormality. Electronically Signed   By: Claudie Revering M.D.   On: 01/22/2020 13:13   CT Angio Chest PE W and/or Wo Contrast  Result Date: 01/27/2020 CLINICAL DATA:  Worsening shortness of breath. EXAM: CT ANGIOGRAPHY CHEST WITH CONTRAST TECHNIQUE: Multidetector CT imaging of the chest was performed using the standard protocol during bolus administration of intravenous contrast. Multiplanar CT image reconstructions and MIPs were obtained to evaluate the vascular anatomy. CONTRAST:  137m OMNIPAQUE IOHEXOL 350 MG/ML SOLN COMPARISON:  None. FINDINGS: Cardiovascular: Satisfactory opacification of  the pulmonary arteries to the segmental level. No evidence of pulmonary embolism. Normal heart size. No pericardial effusion. Mediastinum/Nodes: No enlarged mediastinal, hilar, or axillary lymph nodes. Thyroid gland, trachea, and esophagus demonstrate no significant findings. Lungs/Pleura: Moderate severity multifocal patchy infiltrates are seen throughout both lungs. There is no evidence of a pleural effusion or pneumothorax. Upper Abdomen: No acute abnormality. Musculoskeletal: No chest wall abnormality. No acute or significant osseous findings. Review of the MIP images confirms the above findings. IMPRESSION: 1. No evidence of pulmonary embolus. 2. Moderate severity multifocal patchy bilateral infiltrates. Electronically Signed   By: TVirgina NorfolkM.D.   On: 01/27/2020 17:22   DG Chest Portable 1 View  Result Date: 01/27/2020 CLINICAL DATA:  Shortness of breath, COVID positive. Additional provided: Worsening shortness of breath onset Wednesday, diagnosed with COVID May 11th. Cough. EXAM: PORTABLE CHEST 1 VIEW COMPARISON:  Chest radiograph 01/22/2020 FINDINGS: Heart size within normal limits. Interstitial opacities within the mid to lower right lung field and left lung base. Findings are consistent with atypical/viral pneumonia given provided history of COVID positivity. No evidence of pleural effusion or pneumothorax. No acute bony abnormality identified. IMPRESSION: Interstitial opacities with the mid to lower right lung field and left lung base. Findings are consistent with atypical/viral pneumonia given the provided history of COVID positivity. Electronically Signed   By: KKellie SimmeringDO   On: 01/27/2020 13:20   DG Chest Portable 1 View  Result Date: 01/19/2020 CLINICAL DATA:  Cough and aches, probable COVID EXAM: PORTABLE CHEST 1 VIEW COMPARISON:  10/19/2019 FINDINGS: Normal heart size and mediastinal contours. Low lung volumes. No acute infiltrate or edema. No effusion or pneumothorax. No acute  osseous findings. IMPRESSION: Negative low volume chest. Electronically Signed   By: JMonte FantasiaM.D.   On: 01/19/2020 07:05  DG Abd Portable 1V  Result Date: 01/30/2020 CLINICAL DATA:  44 year old female with a history of abdominal pain nausea and vomiting EXAM: PORTABLE ABDOMEN - 1 VIEW COMPARISON:  None. FINDINGS: The bowel gas pattern is normal. No radio-opaque calculi or other significant radiographic abnormality are seen. IMPRESSION: Negative plain film abdomen Electronically Signed   By: Corrie Mckusick D.O.   On: 01/30/2020 13:36    Micro Results     Recent Results (from the past 240 hour(s))  Blood Culture (routine x 2)     Status: None (Preliminary result)   Collection Time: 01/27/20  3:43 PM   Specimen: BLOOD  Result Value Ref Range Status   Specimen Description BLOOD RIGHT ANTECUBITAL  Final   Special Requests   Final    BOTTLES DRAWN AEROBIC AND ANAEROBIC Blood Culture adequate volume   Culture   Final    NO GROWTH 3 DAYS Performed at [facility], [address] [place], [place] [address]    Report Status PENDING  Incomplete  Blood Culture (routine x 2)     Status: None (Preliminary result)   Collection Time: 01/27/20  3:48 PM   Specimen: BLOOD  Result Value Ref Range Status   Specimen Description BLOOD LEFT ANTECUBITAL  Final   Special Requests   Final    BOTTLES DRAWN AEROBIC AND ANAEROBIC Blood Culture adequate volume   Culture   Final    NO GROWTH 3 DAYS Performed at [facility], [address] [place], [place] [address]    Report Status PENDING  Incomplete    Today   Subjective    Kaiah Sterne today has no headache,no chest abdominal pain,no new weakness tingling or numbness, feels much better     Objective   Blood pressure 118/80, pulse 63, temperature (!) 97.5 F (36.4 C), temperature source Oral, resp. rate 20, height _0  (1.549 m), weight 82.9 kg, last menstrual period 01/15/2020, SpO2 93 %.   Intake/Output Summary (Last 24  hours) at 01/31/2020 0930 Last data filed at 01/30/2020 1400 Gross per 24 hour  Intake 100 ml  Output --  Net 100 ml    Exam  Awake Alert, No new F.N deficits, Normal affect [place].AT,PERRAL Supple Neck,No JVD, No cervical lymphadenopathy appriciated.  Symmetrical Chest wall movement, Good air movement bilaterally, CTAB RRR,No Gallops,Rubs or new Murmurs, No Parasternal Heave +ve B.Sounds, Abd Soft, Non tender, No organomegaly appriciated, No rebound -guarding or rigidity. No Cyanosis, Clubbing or edema, No new Rash or bruise   Data Review   CBC w Diff:  Lab Results  Component Value Date   WBC 5.9 01/31/2020   HGB 12.5 01/31/2020   HGB 10.8 (L) 12/02/2019   HCT 38.3 01/31/2020   PLT 285 01/31/2020   PLT 298 12/02/2019   LYMPHOPCT 25 01/31/2020   MONOPCT 8 01/31/2020   EOSPCT 0 01/31/2020   BASOPCT 0 01/31/2020    CMP:  Lab Results  Component Value Date   NA 144 01/31/2020   K 3.8 01/31/2020   CL 108 01/31/2020   CO2 26 01/31/2020   BUN 12 01/31/2020   CREATININE 0.77 01/31/2020   CREATININE 0.86 12/02/2019   CREATININE 0.89 05/29/2013   PROT 6.2 (L) 01/31/2020   ALBUMIN 3.2 (L) 01/31/2020   BILITOT 0.6 01/31/2020   BILITOT 0.2 (L) 12/02/2019   ALKPHOS 57 01/31/2020   AST 20 01/31/2020   AST 10 (L) 12/02/2019   ALT 32 01/31/2020   ALT 11 12/02/2019  .  Total Time in preparing paper work, data evaluation and todays exam - 70 minutes  Lala Lund M.D on 01/31/2020 at 9:30 AM  [facility]   Office  [phone]

## 2020-02-01 LAB — CULTURE, BLOOD (ROUTINE X 2)
Culture: NO GROWTH
Culture: NO GROWTH
Special Requests: ADEQUATE
Special Requests: ADEQUATE

## 2020-02-02 ENCOUNTER — Other Ambulatory Visit: Payer: Self-pay

## 2020-02-02 ENCOUNTER — Telehealth (INDEPENDENT_AMBULATORY_CARE_PROVIDER_SITE_OTHER): Payer: BC Managed Care – PPO | Admitting: Nurse Practitioner

## 2020-02-02 ENCOUNTER — Encounter: Payer: Self-pay | Admitting: Nurse Practitioner

## 2020-02-02 VITALS — Ht 61.0 in | Wt 182.0 lb

## 2020-02-02 DIAGNOSIS — U071 COVID-19: Secondary | ICD-10-CM

## 2020-02-02 NOTE — Progress Notes (Signed)
Virtual Visit via Video Note  I connected with@ on 02/02/20 at 11:30 AM EDT by a video enabled telemedicine application and verified that I am speaking with the correct person using two identifiers.  Location: Patient:Home Provider: Office Participants: patient and provider  I discussed the limitations of evaluation and management by telemedicine and the availability of in person appointments. I also discussed with the patient that there may be a patient responsible charge related to this service. The patient expressed understanding and agreed to proceed.  TS:[id number] f/up for COVID pneumonia  History of Present Illness: COVID-19 infection with Pneumonia: Diagnosed  01/19/2020, hospitalization: 5/19-5/23 Treated with systemic corticosteriod, and remdesevir. She is at home with boyfriend and child Today she reports improved symptoms. Has SOB with exertion like climbing stairs. She uses of albuterol 2x/day prn and robitussin for cough. Denies any fever since discharge from hospital She is able to eat and drink fluids adequately.  Observations/Objective: Physical Exam  Constitutional: She is oriented to person, place, and time. No distress.  Pulmonary/Chest: Effort normal.  Neurological: She is alert and oriented to person, place, and time.  Skin: She is not diaphoretic.  Psychiatric: She has a normal mood and affect. Her behavior is normal. Thought content normal.  unable to provide any vital signs  Assessment and Plan: Dereona was seen today for hospitalization follow-up.  Diagnoses and all orders for this visit:  COVID-19 virus infection -     DG Chest 2 View; Future -     Basic metabolic panel; Future -     CBC; Future   Follow Up Instructions: FMLA form related to COVID infection will be completed for 5/06 to 02/03/2020. Continue use of albuterol and robitussin as needed. Maintain adequate oral hydration and food intake. Do not stay sedentary  will refer to post-COVID  clinic if symptoms do not continue to improve   I discussed the assessment and treatment plan with the patient. The patient was provided an opportunity to ask questions and all were answered. The patient agreed with the plan and demonstrated an understanding of the instructions.   The patient was advised to call back or seek an in-person evaluation if the symptoms worsen or if the condition fails to improve as anticipated.   Wilfred Lacy, NP

## 2020-02-02 NOTE — Patient Instructions (Signed)
FMLA form related to COVID infection will be completed for 5/06 to 02/03/2020. Continue use of albuterol and robitussin as needed. Maintain adequate oral hydration and food intake. Do not stay sedentary  will refer to post-COVID clinic if symptoms do not continue to improve.

## 2020-02-02 NOTE — Telephone Encounter (Signed)
Pt is scheduled for a virtual f/up appointment today 02/02/2020 at 11:30 for follow up and FMLA papers.

## 2020-02-09 ENCOUNTER — Other Ambulatory Visit: Payer: Self-pay

## 2020-02-09 ENCOUNTER — Other Ambulatory Visit (INDEPENDENT_AMBULATORY_CARE_PROVIDER_SITE_OTHER): Payer: BC Managed Care – PPO

## 2020-02-09 ENCOUNTER — Ambulatory Visit (INDEPENDENT_AMBULATORY_CARE_PROVIDER_SITE_OTHER): Payer: BC Managed Care – PPO

## 2020-02-09 DIAGNOSIS — U071 COVID-19: Secondary | ICD-10-CM | POA: Diagnosis not present

## 2020-02-09 DIAGNOSIS — R05 Cough: Secondary | ICD-10-CM | POA: Diagnosis not present

## 2020-02-10 ENCOUNTER — Other Ambulatory Visit (INDEPENDENT_AMBULATORY_CARE_PROVIDER_SITE_OTHER): Payer: BC Managed Care – PPO

## 2020-02-10 DIAGNOSIS — U071 COVID-19: Secondary | ICD-10-CM | POA: Diagnosis not present

## 2020-02-10 LAB — BASIC METABOLIC PANEL
BUN: 11 mg/dL (ref 6–23)
CO2: 28 mEq/L (ref 19–32)
Calcium: 9.1 mg/dL (ref 8.4–10.5)
Chloride: 107 mEq/L (ref 96–112)
Creatinine, Ser: 0.9 mg/dL (ref 0.40–1.20)
GFR: 82.95 mL/min (ref >60.00)
Glucose, Bld: 132 mg/dL — ABNORMAL HIGH (ref 70–99)
Potassium: 3.8 mEq/L (ref 3.5–5.1)
Sodium: 140 mEq/L (ref 135–145)

## 2020-02-10 LAB — CBC
HCT: 36.9 % (ref 36.0–46.0)
Hemoglobin: 12.4 g/dL (ref 12.0–15.0)
MCHC: 33.5 g/dL (ref 30.0–36.0)
MCV: 85.1 fl (ref 78.0–100.0)
Platelets: 199 10*3/uL (ref 150.0–400.0)
RBC: 4.34 Mil/uL (ref 3.87–5.11)
RDW: 20.8 % — ABNORMAL HIGH (ref 11.5–15.5)
WBC: 3.2 10*3/uL — ABNORMAL LOW (ref 4.0–10.5)

## 2020-02-10 NOTE — Addendum Note (Signed)
Addended by: Lynnea Ferrier on: 02/10/2020 04:06 PM   Modules accepted: Orders

## 2020-02-11 LAB — HEMOGLOBIN A1C: Hgb A1c MFr Bld: 6.2 % (ref 4.6–6.5)

## 2020-02-14 NOTE — Progress Notes (Signed)
[place]  [phone] [phone]     ID: Erika David DOB: Apr 27, 1976  MR#: [id number]  JGO#:[id number]  Patient Care Team: Flossie Buffy, NP as PCP - General (Internal Medicine) [name], MD OTHER MD:  CHIEF COMPLAINT: iron deficiency  CURRENT TREATMENT: s/p feraheme x 2   INTERVAL HISTORY: Erika David returns today for follow up of Erika David iron deficiency.  Since Erika David last visit, Erika David was diagnosed with Covid-19 on 01/13/41.  Erika David thinks Erika David may have got it at school but Erika David entire family was quarantined and Erika David is the one you had at the worst.  Erika David was seen in the ED for this on 5/11, 5/14, and 01/27/2020. Erika David was admitted for acute respiratory failure secondary to Covid-19 pneumonia following Erika David third ED visit.  Erika David received feraheme 11/10/40 and 01/13/2020. Erika David most recent Hb on 02/08/41 was 12.4 with an MCV of 85.1  Erika David started Erika David most recent period about 9 days ago and Erika David is still having some spotting.     REVIEW OF SYSTEMS: Erika David had a very difficult experience with the COVID-19 but Erika David is recovering well.  Erika David still feels a bit short of breath and has a slight cough.  Erika David feels more tired than Erika David should think Erika David should at this point.  A detailed review of systems was otherwise stable   HISTORY OF CURRENT ILLNESS: From the original intake note:  "Erika David" was admitted to the hospital from 11/08/2019 through 11/15/2019.  Erika David was found to have lymphocytic colitis and underwent colonoscopy with Dr. Fuller Plan on 3/4 and was started on Budesonide, Entocort, and Loperamide PRN on 11/13/2019.  Erika David chronic anemia was noted to be related to menorrhagia and Erika David received one dose of IV iron on 11/11/2019.  Since receiving that IV iron, Erika David hemoglobin improved from 9.7 to 10.8.  Erika David anemia dates back to 2011 when Erika David hemoglobin was as low as 10.2.  Erika David did have anemia associated with Erika David fourth and fifth pregnancies, however after Erika David fifth pregnancy Erika David  mild anemia remained and didn't rebound.  We have two ferritin levels on record, with the first one being on 11/09/2019 which was 8.    Results for SEQUOYAH, RAMONE (MRN [id number]) as of 12/02/2019 14:17  Ref. Range 03/17/2009 16:27 04/17/2009 23:31 11/04/2009 05:10 10/16/2011 15:28 11/20/2011 09:08 01/21/2012 11:46 03/12/2012 00:30 04/12/2012 05:35 05/29/2013 14:12 05/11/2016 16:46 06/18/2019 19:01 11/05/2019 16:07 12/02/2019 13:33  Hemoglobin Latest Ref Range: 12.0 - 15.0 g/dL 13.0 12.1 10.2 (L) 12.7 11.8 (L) 10.8 (L) 11.2 (L) 9.9 (L) 12.7 10.4 (L) 9.3 (L) 9.7 (L) 10.8 (L)    Erika David has not yet been evaluated by gynecology.  Erika David notes that Erika David has been having heavier cycles since having Erika David tubal ligation.  Erika David notes it is 7 days in length, sometimes longer.  Erika David pad count is about 3-5 per day for about 4-7 days.  Then it will lighten and taper off.    The patient's subsequent history is as detailed below.   PAST MEDICAL HISTORY: Past Medical History:  Diagnosis Date  . Abnormal Pap smear     LEEP?  LAST PAP 05/2009  . Anemia 01/04/2012  . Chlamydia   . H/O candidiasis   . H/O dysmenorrhea 03/13/02  . H/O gonorrhea    treated  . H/O menorrhagia 07/09/02  . Infection 2006   GONORHRREA  . Infection 2006   CHLAMYDIA  . Infection    FREQ YEAST  . Pelvic injury  AS TEEN   SPORTS RELATED  . Pelvic pain 07/11/10  . Trichomonas   . Yeast infection     PAST SURGICAL HISTORY: Past Surgical History:  Procedure Laterality Date  . BIOPSY  11/11/40   Procedure: BIOPSY;  Surgeon: Ladene Artist, MD;  Location: [facility];  Service: Endoscopy;;  . CESAREAN SECTION  04/11/2012   Procedure: CESAREAN SECTION;  Surgeon: Delice Lesch, MD;  Location: [facility];  Service: Gynecology;  Laterality: N/A;  twins  . COLONOSCOPY WITH PROPOFOL N/A 11/12/2019   Procedure: COLONOSCOPY WITH PROPOFOL;  Surgeon: Ladene Artist, MD;  Location: [facility];  Service: Endoscopy;  Laterality: N/A;  . GYNECOLOGIC CRYOSURGERY    .  TUBAL LIGATION    . WISDOM TOOTH EXTRACTION  2007    FAMILY HISTORY Family History  Problem Relation Age of Onset  . Hypertension Mother   . Cancer Mother        BREAST  . Thyroid disease Mother   . Diabetes Father   . Hypertension Sister   . Lupus Maternal Aunt   . Liver disease Sister   . Asthma Cousin   . Hypertension Maternal Grandmother   . Diabetes Maternal Grandmother   . Heart disease Maternal Grandfather   . Stroke Maternal Grandfather   . Alcohol abuse Maternal Aunt   . Drug abuse Maternal Aunt   . Anesthesia problems Neg Hx     GYNECOLOGIC HISTORY:  No LMP recorded. (Menstrual status: Irregular Periods). Menarche: 44 years old Age at first live birth: 44 years old [place] P 5 LMP 11/20/2019 Contraceptive tubal ligation HRT/OCP: none  Hysterectomy? no Salpingo-oophorectomy?no   SOCIAL HISTORY: (Updated April 2021) Erika David is "common law married" to Erika David significant other [employer].  Erika David lives with him in [place], [place].  Erika David children are: daughter 34, daughter 83, son 75, twin boy and girl who are 7.  All of Erika David children live with Erika David at home.  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  Erika David enjoys reading.      ADVANCED DIRECTIVES:    HEALTH MAINTENANCE: Social History   Tobacco Use  . Smoking status: Never Smoker  . Smokeless tobacco: Never Used  Substance Use Topics  . Alcohol use: Yes    Comment: occasional alcohol  . Drug use: No     Colonoscopy:11/12/2019  PAP: 2019  Bone density: not indicated   Allergies  Allergen Reactions  . Dust Mite Extract Anaphylaxis  . Kiwi Extract Itching and Swelling  . Pollen Extract Anaphylaxis    Tree pollen in environment    Current Outpatient Medications  Medication Sig Dispense Refill  . albuterol (VENTOLIN HFA) 108 (90 Base) MCG/ACT inhaler Inhale 2 puffs into the lungs every 6 (six) hours as needed for wheezing or shortness of breath. 6.7 g 0  . budesonide (ENTOCORT EC) 3 MG 24  hr capsule Take 1 capsule (3 mg total) by mouth daily. 90 capsule 3  . cetirizine (ZYRTEC) 10 MG chewable tablet Chew 10 mg by mouth daily.    [name] dicyclomine (BENTYL) 10 MG capsule Take 1 capsule (10 mg total) by mouth 4 (four) times daily -  before meals and at bedtime. 120 capsule 0  . guaiFENesin-dextromethorphan (ROBITUSSIN DM) 100-10 MG/5ML syrup Take 5 mLs by mouth every 8 (eight) hours as needed for cough. 118 mL 0  . loperamide (IMODIUM) 2 MG capsule Take 1 capsule (2 mg total) by mouth 3 (three) times daily as  needed for diarrhea or loose stools. 15 capsule 0  . ondansetron (ZOFRAN) 4 MG tablet Take 1 tablet (4 mg total) by mouth every 8 (eight) hours as needed for nausea or vomiting. 10 tablet 0   No current facility-administered medications for this visit.    OBJECTIVE: African-American woman in no acute distress  Vitals:   02/15/20 1143  BP: 125/82  Pulse: 76  Resp: 18  Temp: 98.5 F (36.9 C)  SpO2: 100%     Body mass index is 35.5 kg/m.   Wt Readings from Last 3 Encounters:  02/15/20 187 lb 14.4 oz (85.2 kg)  02/02/20 182 lb (82.6 kg)  01/27/20 182 lb 12.2 oz (82.9 kg)      ECOG FS:1 - Symptomatic but completely ambulatory  Sclerae unicteric, EOMs intact Wearing a mask No cervical or supraclavicular adenopathy Lungs no rales or rhonchi Heart regular rate and rhythm Abd soft, nontender, positive bowel sounds Neuro: nonfocal, well oriented, appropriate affect Breasts: Deferred   LAB RESULTS:  CMP     Component Value Date/Time   NA 140 02/09/2020 1516   K 3.8 02/09/2020 1516   CL 107 02/09/2020 1516   CO2 28 02/09/2020 1516   GLUCOSE 132 (H) 02/09/2020 1516   BUN 11 02/09/2020 1516   CREATININE 0.90 02/09/2020 1516   CREATININE 0.86 12/02/2019 1333   CREATININE 0.89 05/29/2013 1403   CALCIUM 9.1 02/09/2020 1516   PROT 6.2 (L) 01/31/2020 0328   ALBUMIN 3.2 (L) 01/31/2020 0328   AST 20 01/31/2020 0328   AST 10 (L) 12/02/2019 1333   ALT 32 01/31/2020  0328   ALT 11 12/02/2019 1333   ALKPHOS 57 01/31/2020 0328   BILITOT 0.6 01/31/2020 0328   BILITOT 0.2 (L) 12/02/2019 1333   GFRNONAA >60 01/31/2020 0328   GFRNONAA >60 12/02/2019 1333   GFRAA >60 01/31/2020 0328   GFRAA >60 12/02/2019 1333    No results found for: TOTALPROTELP, ALBUMINELP, A1GS, A2GS, BETS, BETA2SER, GAMS, MSPIKE, SPEI  No results found for: Nils Pyle, [facility]  Lab Results  Component Value Date   WBC 2.9 (L) 02/15/2020   NEUTROABS 0.8 (L) 02/15/2020   HGB 11.9 (L) 02/15/2020   HCT 36.1 02/15/2020   MCV 84.7 02/15/2020   PLT 144 (L) 02/15/2020      Chemistry      Component Value Date/Time   NA 140 02/09/2020 1516   K 3.8 02/09/2020 1516   CL 107 02/09/2020 1516   CO2 28 02/09/2020 1516   BUN 11 02/09/2020 1516   CREATININE 0.90 02/09/2020 1516   CREATININE 0.86 12/02/2019 1333   CREATININE 0.89 05/29/2013 1403      Component Value Date/Time   CALCIUM 9.1 02/09/2020 1516   ALKPHOS 57 01/31/2020 0328   AST 20 01/31/2020 0328   AST 10 (L) 12/02/2019 1333   ALT 32 01/31/2020 0328   ALT 11 12/02/2019 1333   BILITOT 0.6 01/31/2020 0328   BILITOT 0.2 (L) 12/02/2019 1333       No results found for: LABCA2  No components found for: IWLNLG921  No results for input(s): INR in the last 168 hours.  No results found for: LABCA2  No results found for: JHE174  No results found for: YCX448  No results found for: JEH631  No results found for: CA2729  No components found for: HGQUANT  No results found for: CEA1 / No results found for: CEA1   No results found for: AFPTUMOR  No results found for: [facility]  No results found for: HGBA, HGBA2QUANT, HGBFQUANT, HGBSQUAN (Hemoglobinopathy evaluation)   Lab Results  Component Value Date   LDH 259 (H) 01/27/2020    Lab Results  Component Value Date   IRON 45 11/18/2019   TIBC 362 11/18/2019   IRONPCTSAT 12 (L) 11/18/2019   (Iron and TIBC)  Lab Results  Component Value  Date   FERRITIN 245 01/31/2020    Urinalysis    Component Value Date/Time   COLORURINE YELLOW 01/27/2020 1800   APPEARANCEUR HAZY (A) 01/27/2020 1800   LABSPEC 1.027 01/27/2020 1800   PHURINE 6.0 01/27/2020 1800   GLUCOSEU NEGATIVE 01/27/2020 1800   HGBUR NEGATIVE 01/27/2020 1800   BILIRUBINUR NEGATIVE 01/27/2020 1800   BILIRUBINUR neg 05/29/2013 1412   KETONESUR 5 (A) 01/27/2020 1800   PROTEINUR 30 (A) 01/27/2020 1800   UROBILINOGEN 0.2 05/29/2013 1412   UROBILINOGEN 0.2 04/08/2012 2330   NITRITE NEGATIVE 01/27/2020 1800   LEUKOCYTESUR MODERATE (A) 01/27/2020 1800    STUDIES: DG Chest 2 View  Result Date: 02/09/2020 CLINICAL DATA:  44 year old female with persistent nonproductive cough after COVID-19 diagnosis 1 month ago. EXAM: CHEST - 2 VIEW COMPARISON:  Portable chest and CTA chest 01/27/2020 and earlier. FINDINGS: Continued somewhat low lung volumes, a especially on the lateral view today. Normal cardiac size and mediastinal contours. Visualized tracheal air column is within normal limits. Improved bibasilar ventilation. Mild residual asymmetric basilar predominant interstitial opacity. No superimposed pneumothorax or pleural effusion. No acute osseous abnormality identified. Negative visible bowel gas pattern. IMPRESSION: Regressed but not completely resolved pulmonary interstitial opacity since last month. No new cardiopulmonary abnormality. Electronically Signed   By: Genevie Ann M.D.   On: 02/09/2020 15:55   DG Chest 2 View  Result Date: 01/22/2020 CLINICAL DATA:  Worsening weakness. COVID-19 positive. EXAM: CHEST - 2 VIEW COMPARISON:  01/19/2020 FINDINGS: Normal sized heart. Clear lungs. Stable mild peribronchial thickening. Unremarkable bones. IMPRESSION: Stable mild bronchitic changes. No acute abnormality. Electronically Signed   By: Claudie Revering M.D.   On: 01/22/2020 13:13   CT Angio Chest PE W and/or Wo Contrast  Result Date: 01/27/2020 CLINICAL DATA:  Worsening shortness  of breath. EXAM: CT ANGIOGRAPHY CHEST WITH CONTRAST TECHNIQUE: Multidetector CT imaging of the chest was performed using the standard protocol during bolus administration of intravenous contrast. Multiplanar CT image reconstructions and MIPs were obtained to evaluate the vascular anatomy. CONTRAST:  160m OMNIPAQUE IOHEXOL 350 MG/ML SOLN COMPARISON:  None. FINDINGS: Cardiovascular: Satisfactory opacification of the pulmonary arteries to the segmental level. No evidence of pulmonary embolism. Normal heart size. No pericardial effusion. Mediastinum/Nodes: No enlarged mediastinal, hilar, or axillary lymph nodes. Thyroid gland, trachea, and esophagus demonstrate no significant findings. Lungs/Pleura: Moderate severity multifocal patchy infiltrates are seen throughout both lungs. There is no evidence of a pleural effusion or pneumothorax. Upper Abdomen: No acute abnormality. Musculoskeletal: No chest wall abnormality. No acute or significant osseous findings. Review of the MIP images confirms the above findings. IMPRESSION: 1. No evidence of pulmonary embolus. 2. Moderate severity multifocal patchy bilateral infiltrates. Electronically Signed   By: TVirgina NorfolkM.D.   On: 01/27/2020 17:22   DG Chest Portable 1 View  Result Date: 01/27/2020 CLINICAL DATA:  Shortness of breath, COVID positive. Additional provided: Worsening shortness of breath onset Wednesday, diagnosed with COVID May 11th. Cough. EXAM: PORTABLE CHEST 1 VIEW COMPARISON:  Chest radiograph 01/22/2020 FINDINGS: Heart size within normal limits. Interstitial opacities within the mid to lower right lung field and left lung base. Findings are  consistent with atypical/viral pneumonia given provided history of COVID positivity. No evidence of pleural effusion or pneumothorax. No acute bony abnormality identified. IMPRESSION: Interstitial opacities with the mid to lower right lung field and left lung base. Findings are consistent with atypical/viral  pneumonia given the provided history of COVID positivity. Electronically Signed   By: Kellie Simmering DO   On: 01/27/2020 13:20   DG Chest Portable 1 View  Result Date: 01/19/2020 CLINICAL DATA:  Cough and aches, probable COVID EXAM: PORTABLE CHEST 1 VIEW COMPARISON:  10/19/2019 FINDINGS: Normal heart size and mediastinal contours. Low lung volumes. No acute infiltrate or edema. No effusion or pneumothorax. No acute osseous findings. IMPRESSION: Negative low volume chest. Electronically Signed   By: Monte Fantasia M.D.   On: 01/19/2020 07:05   DG Abd Portable 1V  Result Date: 01/30/2020 CLINICAL DATA:  44 year old female with a history of abdominal pain nausea and vomiting EXAM: PORTABLE ABDOMEN - 1 VIEW COMPARISON:  None. FINDINGS: The bowel gas pattern is normal. No radio-opaque calculi or other significant radiographic abnormality are seen. IMPRESSION: Negative plain film abdomen Electronically Signed   By: Corrie Mckusick D.O.   On: 01/30/2020 13:36    ASSESSMENT: 44 y.o. [place] woman here today for evaluation regarding iron deficiency.    1. Microcytic anemia dating back to 2013, unable to tolerate oral iron  (a) No work-up until 11/09/2019, when ferritin was 8  (b) Received IV iron--Feraheme 548m on 11/11/2019   2. Menorrhagia:  (a) to start goserelin 02/23/2020  2. Lymphocytic colitis managed by Dr. SFuller Plan (a) colonoscopy 11/12/2019   PLAN: SOk Edwardshad a very difficult experience with Erika David COVID-19 infection and Erika David still has some residual symptoms in terms of shortness of breath cough and fatigue, but basically Erika David is recovered.  Quite aside from Erika David lymphocytic colitis Erika David does have heavy periods and this is contributing to Erika David iron deficiency anemia.  Erika David is interested in goserelin.  We discussed the possible toxicities side effects and complications of this agent which essentially will put Erika David in temporary menopause.  This means hot flashes vaginal dryness weight gain insomnia thinner  bones and other menopausal issues.  Despite all this Erika David is very interested in proceeding because Erika David is having heavy periods which are themselves very uncomfortable.  Erika David will have Erika David first dose 02/23/2020 and then monthly for 3 months.  Erika David will see me with the August dose and if everything is going well we will consider switching it to every 3 months thereafter  Erika David knows to call for any other issue that may develop before then  Total encounter time 25 minutes.*Sarajane JewsC. Ashelyn Mccravy, MD 02/15/20 12:03 PM Medical On[facility] H[facility] [address]Tel. [phone]   Fax. [phone]  I, KWilburn Mylar am acting as scribe for Dr. GVirgie Dad Yuka Lallier.  I, GLurline DelMD, have reviewed the above documentation for accuracy and completeness, and I agree with the above.    *Total Encounter Time as defined by the Centers for Medicare and Medicaid Services includes, in addition to the face-to-face time of a patient visit (documented in the note above) non-face-to-face time: obtaining and reviewing outside history, ordering and reviewing medications, tests or procedures, care coordination (communications with other health care professionals or caregivers) and documentation in the medical record.

## 2020-02-15 ENCOUNTER — Inpatient Hospital Stay: Payer: BC Managed Care – PPO | Attending: Adult Health | Admitting: Oncology

## 2020-02-15 ENCOUNTER — Other Ambulatory Visit: Payer: Self-pay

## 2020-02-15 ENCOUNTER — Other Ambulatory Visit: Payer: BC Managed Care – PPO

## 2020-02-15 ENCOUNTER — Telehealth: Payer: Self-pay | Admitting: Oncology

## 2020-02-15 ENCOUNTER — Inpatient Hospital Stay: Payer: BC Managed Care – PPO

## 2020-02-15 VITALS — BP 125/82 | HR 76 | Temp 98.5°F | Resp 18 | Ht 61.0 in | Wt 187.9 lb

## 2020-02-15 DIAGNOSIS — D500 Iron deficiency anemia secondary to blood loss (chronic): Secondary | ICD-10-CM

## 2020-02-15 DIAGNOSIS — N921 Excessive and frequent menstruation with irregular cycle: Secondary | ICD-10-CM

## 2020-02-15 DIAGNOSIS — Z8616 Personal history of COVID-19: Secondary | ICD-10-CM | POA: Insufficient documentation

## 2020-02-15 DIAGNOSIS — N920 Excessive and frequent menstruation with regular cycle: Secondary | ICD-10-CM | POA: Insufficient documentation

## 2020-02-15 DIAGNOSIS — D509 Iron deficiency anemia, unspecified: Secondary | ICD-10-CM | POA: Insufficient documentation

## 2020-02-15 LAB — CBC WITH DIFFERENTIAL (CANCER CENTER ONLY)
Abs Immature Granulocytes: 0.01 10*3/uL (ref 0.00–0.07)
Basophils Absolute: 0 10*3/uL (ref 0.0–0.1)
Basophils Relative: 1 %
Eosinophils Absolute: 0.2 10*3/uL (ref 0.0–0.5)
Eosinophils Relative: 8 %
HCT: 36.1 % (ref 36.0–46.0)
Hemoglobin: 11.9 g/dL — ABNORMAL LOW (ref 12.0–15.0)
Immature Granulocytes: 0 %
Lymphocytes Relative: 50 %
Lymphs Abs: 1.4 10*3/uL (ref 0.7–4.0)
MCH: 27.9 pg (ref 26.0–34.0)
MCHC: 33 g/dL (ref 30.0–36.0)
MCV: 84.7 fL (ref 80.0–100.0)
Monocytes Absolute: 0.4 10*3/uL (ref 0.1–1.0)
Monocytes Relative: 15 %
Neutro Abs: 0.8 10*3/uL — ABNORMAL LOW (ref 1.7–7.7)
Neutrophils Relative %: 26 %
Platelet Count: 144 10*3/uL — ABNORMAL LOW (ref 150–400)
RBC: 4.26 MIL/uL (ref 3.87–5.11)
RDW: 17.2 % — ABNORMAL HIGH (ref 11.5–15.5)
WBC Count: 2.9 10*3/uL — ABNORMAL LOW (ref 4.0–10.5)
nRBC: 0 % (ref 0.0–0.2)

## 2020-02-15 LAB — IRON AND TIBC
Iron: 85 ug/dL (ref 41–142)
Saturation Ratios: 29 % (ref 21–57)
TIBC: 296 ug/dL (ref 236–444)
UIBC: 211 ug/dL (ref 120–384)

## 2020-02-15 LAB — RETICULOCYTES
Immature Retic Fract: 12.6 % (ref 2.3–15.9)
RBC.: 4.16 MIL/uL (ref 3.87–5.11)
Retic Count, Absolute: 73.2 10*3/uL (ref 19.0–186.0)
Retic Ct Pct: 1.8 % (ref 0.4–3.1)

## 2020-02-15 LAB — FERRITIN: Ferritin: 65 ng/mL (ref 11–307)

## 2020-02-15 NOTE — Telephone Encounter (Signed)
Scheduled appts per 6/7 los. Gave pt a print out of AVS.

## 2020-02-23 ENCOUNTER — Inpatient Hospital Stay: Payer: BC Managed Care – PPO

## 2020-03-22 ENCOUNTER — Inpatient Hospital Stay: Payer: BC Managed Care – PPO | Attending: Oncology

## 2020-04-18 ENCOUNTER — Other Ambulatory Visit: Payer: Self-pay | Admitting: Oncology

## 2020-04-18 NOTE — Progress Notes (Signed)
[name]  [phone] [phone]) [phone]     ID: Erika David DOB: 03/04/1976  MR#: [id number]  PRF#:[id number]  Patient Care Team: Flossie Buffy, NP as PCP - General (Internal Medicine) [name], [name], MD as Consulting Physician (Oncology) [name], MD OTHER MD:  CHIEF COMPLAINT: iron deficiency  CURRENT TREATMENT: Leuprolide   INTERVAL HISTORY: Erika David returns today for follow up of her iron deficiency.  She received her second Feraheme dose in April.  Her hemoglobin has been rising since and it is now normal.     REVIEW OF SYSTEMS: Erika David is doing quite a bit of exercise, swimming and walking in particular.  She is driving her 5 children to innumerable doctor appointments she says.  Her colitis is under control on very minimal medication at present.  A detailed review of systems today was otherwise stable   HISTORY OF CURRENT ILLNESS: From the original intake note:  "Erika David" was admitted to the hospital from 11/08/2019 through 11/15/2019.  She was found to have lymphocytic colitis and underwent colonoscopy with Dr. Fuller Plan on 3/4 and was started on Budesonide, Entocort, and Loperamide PRN on 11/13/2019.  Her chronic anemia was noted to be related to menorrhagia and she received one dose of IV iron on 11/11/2019.  Since receiving that IV iron, her hemoglobin improved from 9.7 to 10.8.  Her anemia dates back to 2011 when her hemoglobin was as low as 10.2.  She did have anemia associated with her fourth and fifth pregnancies, however after her fifth pregnancy her mild anemia remained and didn't rebound.  We have two ferritin levels on record, with the first one being on 11/09/2019 which was 8.    Results for Erika, David (MRN [id number]) as of 12/02/2019 14:17  Ref. Range 03/17/2009 16:27 04/17/2009 23:31 11/04/2009 05:10 10/16/2011 15:28 11/20/2011 09:08 01/21/2012 11:46 03/12/2012 00:30 04/12/2012 05:35 05/29/2013 14:12 05/11/2016 16:46 06/18/2019 19:01 11/05/2019  16:07 12/02/2019 13:33  Hemoglobin Latest Ref Range: 12.0 - 15.0 g/dL 13.0 12.1 10.2 (L) 12.7 11.8 (L) 10.8 (L) 11.2 (L) 9.9 (L) 12.7 10.4 (L) 9.3 (L) 9.7 (L) 10.8 (L)    She has not yet been evaluated by gynecology.  Erika David notes that she has been having heavier cycles since having her tubal ligation.  She notes it is 7 days in length, sometimes longer.  Her pad count is about 3-5 per day for about 4-7 days.  Then it will lighten and taper off.    The patient's subsequent history is as detailed below.   PAST MEDICAL HISTORY: Past Medical History:  Diagnosis Date  . Abnormal Pap smear     LEEP?  LAST PAP 05/2009  . Anemia 01/04/2012  . Chlamydia   . H/O candidiasis   . H/O dysmenorrhea 03/13/02  . H/O gonorrhea    treated  . H/O menorrhagia 07/09/02  . Infection 2006   GONORHRREA  . Infection 2006   CHLAMYDIA  . Infection    FREQ YEAST  . Pelvic injury AS TEEN   SPORTS RELATED  . Pelvic pain 07/11/10  . Trichomonas   . Yeast infection     PAST SURGICAL HISTORY: Past Surgical History:  Procedure Laterality Date  . BIOPSY  11/12/2019   Procedure: BIOPSY;  Surgeon: Ladene Artist, MD;  Location: [facility];  Service: Endoscopy;;  . CESAREAN SECTION  04/11/2012   Procedure: CESAREAN SECTION;  Surgeon: Delice Lesch, MD;  Location: [facility];  Service: Gynecology;  Laterality: N/A;  twins  . COLONOSCOPY WITH PROPOFOL N/A 11/12/2019   Procedure: COLONOSCOPY WITH PROPOFOL;  Surgeon: Ladene Artist, MD;  Location: [facility];  Service: Endoscopy;  Laterality: N/A;  . GYNECOLOGIC CRYOSURGERY    . TUBAL LIGATION    . WISDOM TOOTH EXTRACTION  2007    FAMILY HISTORY Family History  Problem Relation Age of Onset  . Hypertension Mother   . Cancer Mother        BREAST  . Thyroid disease Mother   . Diabetes Father   . Hypertension Sister   . Lupus Maternal Aunt   . Liver disease Sister   . Asthma Cousin   . Hypertension Maternal Grandmother   . Diabetes Maternal Grandmother    . Heart disease Maternal Grandfather   . Stroke Maternal Grandfather   . Alcohol abuse Maternal Aunt   . Drug abuse Maternal Aunt   . Anesthesia problems Neg Hx     GYNECOLOGIC HISTORY:  No LMP recorded. (Menstrual status: Irregular Periods). Menarche: 44 years old Age at first live birth: 44 years old [place] P 5 LMP 11/20/2019 Contraceptive tubal ligation HRT/OCP: none  Hysterectomy? no Salpingo-oophorectomy?no   SOCIAL HISTORY: (Updated April 2021) Erika David is "common law married" to her significant other [employer].  She lives with him in [place], [place].  Her children are: daughter 42, daughter 38, son 101, twin boy and girl who are 7.  All of her children live with her at home.  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  Erika David enjoys reading.      ADVANCED DIRECTIVES:    HEALTH MAINTENANCE: Social History   Tobacco Use  . Smoking status: Never Smoker  . Smokeless tobacco: Never Used  Vaping Use  . Vaping Use: Never used  Substance Use Topics  . Alcohol use: Yes    Comment: occasional alcohol  . Drug use: No     Colonoscopy:11/12/2019  PAP: 2019  Bone density: not indicated   Allergies  Allergen Reactions  . Dust Mite Extract Anaphylaxis  . Kiwi Extract Itching and Swelling  . Pollen Extract Anaphylaxis    Tree pollen in environment    Current Outpatient Medications  Medication Sig Dispense Refill  . albuterol (VENTOLIN HFA) 108 (90 Base) MCG/ACT inhaler Inhale 2 puffs into the lungs every 6 (six) hours as needed for wheezing or shortness of breath. 6.7 g 0  . budesonide (ENTOCORT EC) 3 MG 24 hr capsule Take 1 capsule (3 mg total) by mouth daily. 90 capsule 3  . dicyclomine (BENTYL) 10 MG capsule Take 1 capsule (10 mg total) by mouth 4 (four) times daily -  before meals and at bedtime. 120 capsule 0  . loperamide (IMODIUM) 2 MG capsule Take 1 capsule (2 mg total) by mouth 3 (three) times daily as needed for diarrhea or loose stools. 15  capsule 0   No current facility-administered medications for this visit.    OBJECTIVE: African-American woman in no acute distress  Vitals:   04/19/20 1305  BP: 128/77  Pulse: 80  Resp: 17  Temp: 98 F (36.7 C)  SpO2: 99%     Body mass index is 35.94 kg/m.   Wt Readings from Last 3 Encounters:  04/19/20 190 lb 3.2 oz (86.3 kg)  02/15/20 187 lb 14.4 oz (85.2 kg)  02/02/20 182 lb (82.6 kg)      ECOG FS:1 - Symptomatic but completely ambulatory  Sclerae unicteric, EOMs intact Wearing a mask No cervical  or supraclavicular adenopathy Lungs no rales or rhonchi Heart regular rate and rhythm Abd soft, nontender, positive bowel sounds MSK no focal spinal tenderness, no upper extremity lymphedema Neuro: nonfocal, well oriented, appropriate affect   LAB RESULTS:  CMP     Component Value Date/Time   NA 140 02/09/2020 1516   K 3.8 02/09/2020 1516   CL 107 02/09/2020 1516   CO2 28 02/09/2020 1516   GLUCOSE 132 (H) 02/09/2020 1516   BUN 11 02/09/2020 1516   CREATININE 0.90 02/09/2020 1516   CREATININE 0.86 12/02/2019 1333   CREATININE 0.89 05/29/2013 1403   CALCIUM 9.1 02/09/2020 1516   PROT 6.2 (L) 01/31/2020 0328   ALBUMIN 3.2 (L) 01/31/2020 0328   AST 20 01/31/2020 0328   AST 10 (L) 12/02/2019 1333   ALT 32 01/31/2020 0328   ALT 11 12/02/2019 1333   ALKPHOS 57 01/31/2020 0328   BILITOT 0.6 01/31/2020 0328   BILITOT 0.2 (L) 12/02/2019 1333   GFRNONAA >60 01/31/2020 0328   GFRNONAA >60 12/02/2019 1333   GFRAA >60 01/31/2020 0328   GFRAA >60 12/02/2019 1333    No results found for: Ronnald Ramp, A1GS, A2GS, BETS, BETA2SER, GAMS, MSPIKE, SPEI  No results found for: Nils Pyle, [facility]  Lab Results  Component Value Date   WBC 4.9 04/19/2020   NEUTROABS 2.7 04/19/2020   HGB 12.7 04/19/2020   HCT 37.7 04/19/2020   MCV 87.7 04/19/2020   PLT 233 04/19/2020      Chemistry      Component Value Date/Time   NA 140 02/09/2020 1516    K 3.8 02/09/2020 1516   CL 107 02/09/2020 1516   CO2 28 02/09/2020 1516   BUN 11 02/09/2020 1516   CREATININE 0.90 02/09/2020 1516   CREATININE 0.86 12/02/2019 1333   CREATININE 0.89 05/29/2013 1403      Component Value Date/Time   CALCIUM 9.1 02/09/2020 1516   ALKPHOS 57 01/31/2020 0328   AST 20 01/31/2020 0328   AST 10 (L) 12/02/2019 1333   ALT 32 01/31/2020 0328   ALT 11 12/02/2019 1333   BILITOT 0.6 01/31/2020 0328   BILITOT 0.2 (L) 12/02/2019 1333       No results found for: LABCA2  No components found for: LKJZPH150  No results for input(s): INR in the last 168 hours.  No results found for: LABCA2  No results found for: VWP794  No results found for: IAX655  No results found for: VZS827  No results found for: CA2729  No components found for: HGQUANT  No results found for: CEA1 / No results found for: CEA1   No results found for: AFPTUMOR  No results found for: CHROMOGRNA  No results found for: HGBA, HGBA2QUANT, HGBFQUANT, HGBSQUAN (Hemoglobinopathy evaluation)   Lab Results  Component Value Date   LDH 259 (H) 01/27/2020    Lab Results  Component Value Date   IRON 85 02/15/2020   TIBC 296 02/15/2020   IRONPCTSAT 29 02/15/2020   (Iron and TIBC)  Lab Results  Component Value Date   FERRITIN 65 02/15/2020    Urinalysis    Component Value Date/Time   COLORURINE YELLOW 01/27/2020 1800   APPEARANCEUR HAZY (A) 01/27/2020 1800   LABSPEC 1.027 01/27/2020 1800   PHURINE 6.0 01/27/2020 1800   GLUCOSEU NEGATIVE 01/27/2020 1800   HGBUR NEGATIVE 01/27/2020 1800   BILIRUBINUR NEGATIVE 01/27/2020 1800   BILIRUBINUR neg 05/29/2013 1412   KETONESUR 5 (A) 01/27/2020 1800   PROTEINUR 30 (A) 01/27/2020 1800  UROBILINOGEN 0.2 05/29/2013 1412   UROBILINOGEN 0.2 04/08/2012 2330   NITRITE NEGATIVE 01/27/2020 1800   LEUKOCYTESUR MODERATE (A) 01/27/2020 1800    STUDIES: No results found.   ASSESSMENT: 44 y.o. [place] woman here today for  evaluation regarding iron deficiency.    1. Microcytic anemia dating back to 2013, unable to tolerate oral iron  (a) No work-up until 11/09/2019, when ferritin was 8  (b) Received IV iron--Feraheme 573m on 11/11/2019 and 12/14/2019  2. Menorrhagia:  (a) starting leuprolide every 4 weeks, first dose 04/19/2020  2. Lymphocytic colitis managed by Dr. SFuller Plan (a) most recent colonoscopy 11/12/2019   PLAN: SOk Edwardshad a good response to her intravenous iron and is no longer anemic.  She is still having very heavy and prolonged periods, most recently mid July.  She is interested in interrupting these and were going with leuprolide.  She has a good understanding of the possible toxicities side effects and complications of this agent as well as the discomfort that may occur from administration.  She knows that after the first dose she will have a period but after the second dose she will be functionally menopausal and will not have any further periods until the leuprolide is discontinued.  She will also not need to use birth control from that point  We are going to see her again in 3 months just to make sure that everything is as she wishes and if so we will start seeing her on a yearly basis thereafter.  Total encounter time 25 minutes.*Sarajane JewsC. Torres Hardenbrook, MD 04/19/20 1:23 PM Medical Oncology and Hematology [facility] [place] [id number]. [phone]   Fax. [phone]  I, KWilburn Mylar am acting as scribe for Dr. GVirgie Dad Smriti Barkow.  I, GLurline DelMD, have reviewed the above documentation for accuracy and completeness, and I agree with the above.   *Total Encounter Time as defined by the Centers for Medicare and Medicaid Services includes, in addition to the face-to-face time of a patient visit (documented in the note above) non-face-to-face time: obtaining and reviewing outside history, ordering and reviewing medications, tests or  procedures, care coordination (communications with other health care professionals or caregivers) and documentation in the medical record.

## 2020-04-19 ENCOUNTER — Inpatient Hospital Stay: Payer: BC Managed Care – PPO

## 2020-04-19 ENCOUNTER — Inpatient Hospital Stay: Payer: BC Managed Care – PPO | Attending: Oncology

## 2020-04-19 ENCOUNTER — Inpatient Hospital Stay (HOSPITAL_BASED_OUTPATIENT_CLINIC_OR_DEPARTMENT_OTHER): Payer: BC Managed Care – PPO | Admitting: Oncology

## 2020-04-19 ENCOUNTER — Other Ambulatory Visit: Payer: Self-pay

## 2020-04-19 VITALS — BP 128/77 | HR 80 | Temp 98.0°F | Resp 17 | Ht 61.0 in | Wt 190.2 lb

## 2020-04-19 DIAGNOSIS — D500 Iron deficiency anemia secondary to blood loss (chronic): Secondary | ICD-10-CM | POA: Diagnosis not present

## 2020-04-19 DIAGNOSIS — K52832 Lymphocytic colitis: Secondary | ICD-10-CM

## 2020-04-19 DIAGNOSIS — Z79818 Long term (current) use of other agents affecting estrogen receptors and estrogen levels: Secondary | ICD-10-CM | POA: Insufficient documentation

## 2020-04-19 DIAGNOSIS — D509 Iron deficiency anemia, unspecified: Secondary | ICD-10-CM | POA: Diagnosis not present

## 2020-04-19 DIAGNOSIS — N920 Excessive and frequent menstruation with regular cycle: Secondary | ICD-10-CM | POA: Insufficient documentation

## 2020-04-19 DIAGNOSIS — Z79899 Other long term (current) drug therapy: Secondary | ICD-10-CM | POA: Diagnosis not present

## 2020-04-19 LAB — CBC WITH DIFFERENTIAL (CANCER CENTER ONLY)
Abs Immature Granulocytes: 0.01 10*3/uL (ref 0.00–0.07)
Basophils Absolute: 0 10*3/uL (ref 0.0–0.1)
Basophils Relative: 0 %
Eosinophils Absolute: 0.2 10*3/uL (ref 0.0–0.5)
Eosinophils Relative: 5 %
HCT: 37.7 % (ref 36.0–46.0)
Hemoglobin: 12.7 g/dL (ref 12.0–15.0)
Immature Granulocytes: 0 %
Lymphocytes Relative: 31 %
Lymphs Abs: 1.5 10*3/uL (ref 0.7–4.0)
MCH: 29.5 pg (ref 26.0–34.0)
MCHC: 33.7 g/dL (ref 30.0–36.0)
MCV: 87.7 fL (ref 80.0–100.0)
Monocytes Absolute: 0.4 10*3/uL (ref 0.1–1.0)
Monocytes Relative: 8 %
Neutro Abs: 2.7 10*3/uL (ref 1.7–7.7)
Neutrophils Relative %: 56 %
Platelet Count: 233 10*3/uL (ref 150–400)
RBC: 4.3 MIL/uL (ref 3.87–5.11)
RDW: 13.3 % (ref 11.5–15.5)
WBC Count: 4.9 10*3/uL (ref 4.0–10.5)
nRBC: 0 % (ref 0.0–0.2)

## 2020-04-19 LAB — IRON AND TIBC
Iron: 84 ug/dL (ref 41–142)
Saturation Ratios: 25 % (ref 21–57)
TIBC: 340 ug/dL (ref 236–444)
UIBC: 256 ug/dL (ref 120–384)

## 2020-04-19 LAB — FERRITIN: Ferritin: 23 ng/mL (ref 11–307)

## 2020-04-19 LAB — RETICULOCYTES
Immature Retic Fract: 11 % (ref 2.3–15.9)
RBC.: 4.36 MIL/uL (ref 3.87–5.11)
Retic Count, Absolute: 76.3 10*3/uL (ref 19.0–186.0)
Retic Ct Pct: 1.8 % (ref 0.4–3.1)

## 2020-04-19 MED ORDER — LEUPROLIDE ACETATE 7.5 MG IM KIT
7.5000 mg | PACK | Freq: Once | INTRAMUSCULAR | Status: AC
  Administered 2020-04-19: 7.5 mg via INTRAMUSCULAR
  Filled 2020-04-19: qty 7.5

## 2020-04-19 NOTE — Patient Instructions (Signed)
Leuprolide injection What is this medicine? LEUPROLIDE (loo PROE lide) is a man-made hormone. It is used to treat the symptoms of prostate cancer. This medicine may also be used to treat children with early onset of puberty. It may be used for other hormonal conditions. This medicine may be used for other purposes; ask your health care provider or pharmacist if you have questions. COMMON BRAND NAME(S): Lupron What should I tell my health care provider before I take this medicine? They need to know if you have any of these conditions:  diabetes  heart disease or previous heart attack  high blood pressure  high cholesterol  pain or difficulty passing urine  spinal cord metastasis  stroke  tobacco smoker  an unusual or allergic reaction to leuprolide, benzyl alcohol, other medicines, foods, dyes, or preservatives  pregnant or trying to get pregnant  breast-feeding How should I use this medicine? This medicine is for injection under the skin or into a muscle. You will be taught how to prepare and give this medicine. Use exactly as directed. Take your medicine at regular intervals. Do not take your medicine more often than directed. It is important that you put your used needles and syringes in a special sharps container. Do not put them in a trash can. If you do not have a sharps container, call your pharmacist or healthcare provider to get one. A special MedGuide will be given to you by the pharmacist with each prescription and refill. Be sure to read this information carefully each time. Talk to your pediatrician regarding the use of this medicine in children. While this medicine may be prescribed for children as young as 8 years for selected conditions, precautions do apply. Overdosage: If you think you have taken too much of this medicine contact a poison control center or emergency room at once. NOTE: This medicine is only for you. Do not share this medicine with others. What if  I miss a dose? If you miss a dose, take it as soon as you can. If it is almost time for your next dose, take only that dose. Do not take double or extra doses. What may interact with this medicine? Do not take this medicine with any of the following medications:  chasteberry This medicine may also interact with the following medications:  herbal or dietary supplements, like black cohosh or DHEA  female hormones, like estrogens or progestins and birth control pills, patches, rings, or injections  female hormones, like testosterone This list may not describe all possible interactions. Give your health care provider a list of all the medicines, herbs, non-prescription drugs, or dietary supplements you use. Also tell them if you smoke, drink alcohol, or use illegal drugs. Some items may interact with your medicine. What should I watch for while using this medicine? Visit your doctor or health care professional for regular checks on your progress. During the first week, your symptoms may get worse, but then will improve as you continue your treatment. You may get hot flashes, increased bone pain, increased difficulty passing urine, or an aggravation of nerve symptoms. Discuss these effects with your doctor or health care professional, some of them may improve with continued use of this medicine. Female patients may experience a menstrual cycle or spotting during the first 2 months of therapy with this medicine. If this continues, contact your doctor or health care professional. This medicine may increase blood sugar. Ask your healthcare provider if changes in diet or medicines are needed if  you have diabetes. What side effects may I notice from receiving this medicine? Side effects that you should report to your doctor or health care professional as soon as possible:  allergic reactions like skin rash, itching or hives, swelling of the face, lips, or tongue  breathing problems  chest  pain  depression or memory disorders  pain in your legs or groin  pain at site where injected  severe headache  signs and symptoms of high blood sugar such as being more thirsty or hungry or having to urinate more than normal. You may also feel very tired or have blurry vision  swelling of the feet and legs  visual changes  vomiting Side effects that usually do not require medical attention (report to your doctor or health care professional if they continue or are bothersome):  breast swelling or tenderness  decrease in sex drive or performance  diarrhea  hot flashes  loss of appetite  muscle, joint, or bone pains  nausea  redness or irritation at site where injected  skin problems or acne This list may not describe all possible side effects. Call your doctor for medical advice about side effects. You may report side effects to FDA at 1-800-FDA-1088. Where should I keep my medicine? Keep out of the reach of children. Store below 25 degrees C (77 degrees F). Do not freeze. Protect from light. Do not use if it is not clear or if there are particles present. Throw away any unused medicine after the expiration date. NOTE: This sheet is a summary. It may not cover all possible information. If you have questions about this medicine, talk to your doctor, pharmacist, or health care provider.  2020 Elsevier/Gold Standard (2018-06-26 09:52:48)

## 2020-04-19 NOTE — Progress Notes (Signed)
Okay to give Leupron today without PA approval per assistant pharmacy director Henreitta Leber.

## 2020-04-20 ENCOUNTER — Telehealth: Payer: Self-pay | Admitting: Adult Health

## 2020-04-20 ENCOUNTER — Encounter: Payer: Self-pay | Admitting: Oncology

## 2020-04-20 ENCOUNTER — Other Ambulatory Visit: Payer: Self-pay | Admitting: Oncology

## 2020-04-20 NOTE — Telephone Encounter (Signed)
Scheduled appts per 8/10 los. Pt confirmed appt dates and times.

## 2020-05-17 ENCOUNTER — Ambulatory Visit: Payer: BC Managed Care – PPO

## 2020-06-14 ENCOUNTER — Ambulatory Visit: Payer: BC Managed Care – PPO

## 2020-07-11 ENCOUNTER — Other Ambulatory Visit: Payer: BC Managed Care – PPO

## 2020-07-11 ENCOUNTER — Ambulatory Visit: Payer: BC Managed Care – PPO

## 2020-07-11 ENCOUNTER — Ambulatory Visit: Payer: BC Managed Care – PPO | Admitting: Adult Health

## 2020-07-11 ENCOUNTER — Inpatient Hospital Stay: Payer: BC Managed Care – PPO | Admitting: Adult Health

## 2020-07-11 ENCOUNTER — Inpatient Hospital Stay: Payer: BC Managed Care – PPO | Attending: Oncology

## 2020-08-09 ENCOUNTER — Ambulatory Visit: Payer: BC Managed Care – PPO

## 2020-08-10 ENCOUNTER — Encounter: Payer: Self-pay | Admitting: Nurse Practitioner

## 2020-08-11 DIAGNOSIS — R197 Diarrhea, unspecified: Secondary | ICD-10-CM

## 2020-08-11 DIAGNOSIS — H10021 Other mucopurulent conjunctivitis, right eye: Secondary | ICD-10-CM | POA: Diagnosis not present

## 2020-08-11 DIAGNOSIS — R933 Abnormal findings on diagnostic imaging of other parts of digestive tract: Secondary | ICD-10-CM

## 2020-08-12 ENCOUNTER — Ambulatory Visit: Payer: BC Managed Care – PPO | Admitting: Nurse Practitioner

## 2020-09-06 ENCOUNTER — Ambulatory Visit: Payer: BC Managed Care – PPO

## 2020-10-04 ENCOUNTER — Ambulatory Visit: Payer: BC Managed Care – PPO

## 2020-11-01 ENCOUNTER — Ambulatory Visit: Payer: BC Managed Care – PPO

## 2020-11-26 IMAGING — DX DG CHEST 2V
2 series · 2 of 2 positions shown · non-contrast
Comparison: Portable chest and CTA chest 01/27/2020 and earlier.

CLINICAL DATA: 44-year-old female with persistent nonproductive
cough after [id number] diagnosis 1 month ago.

EXAM:
CHEST - 2 VIEW

[chest pa]
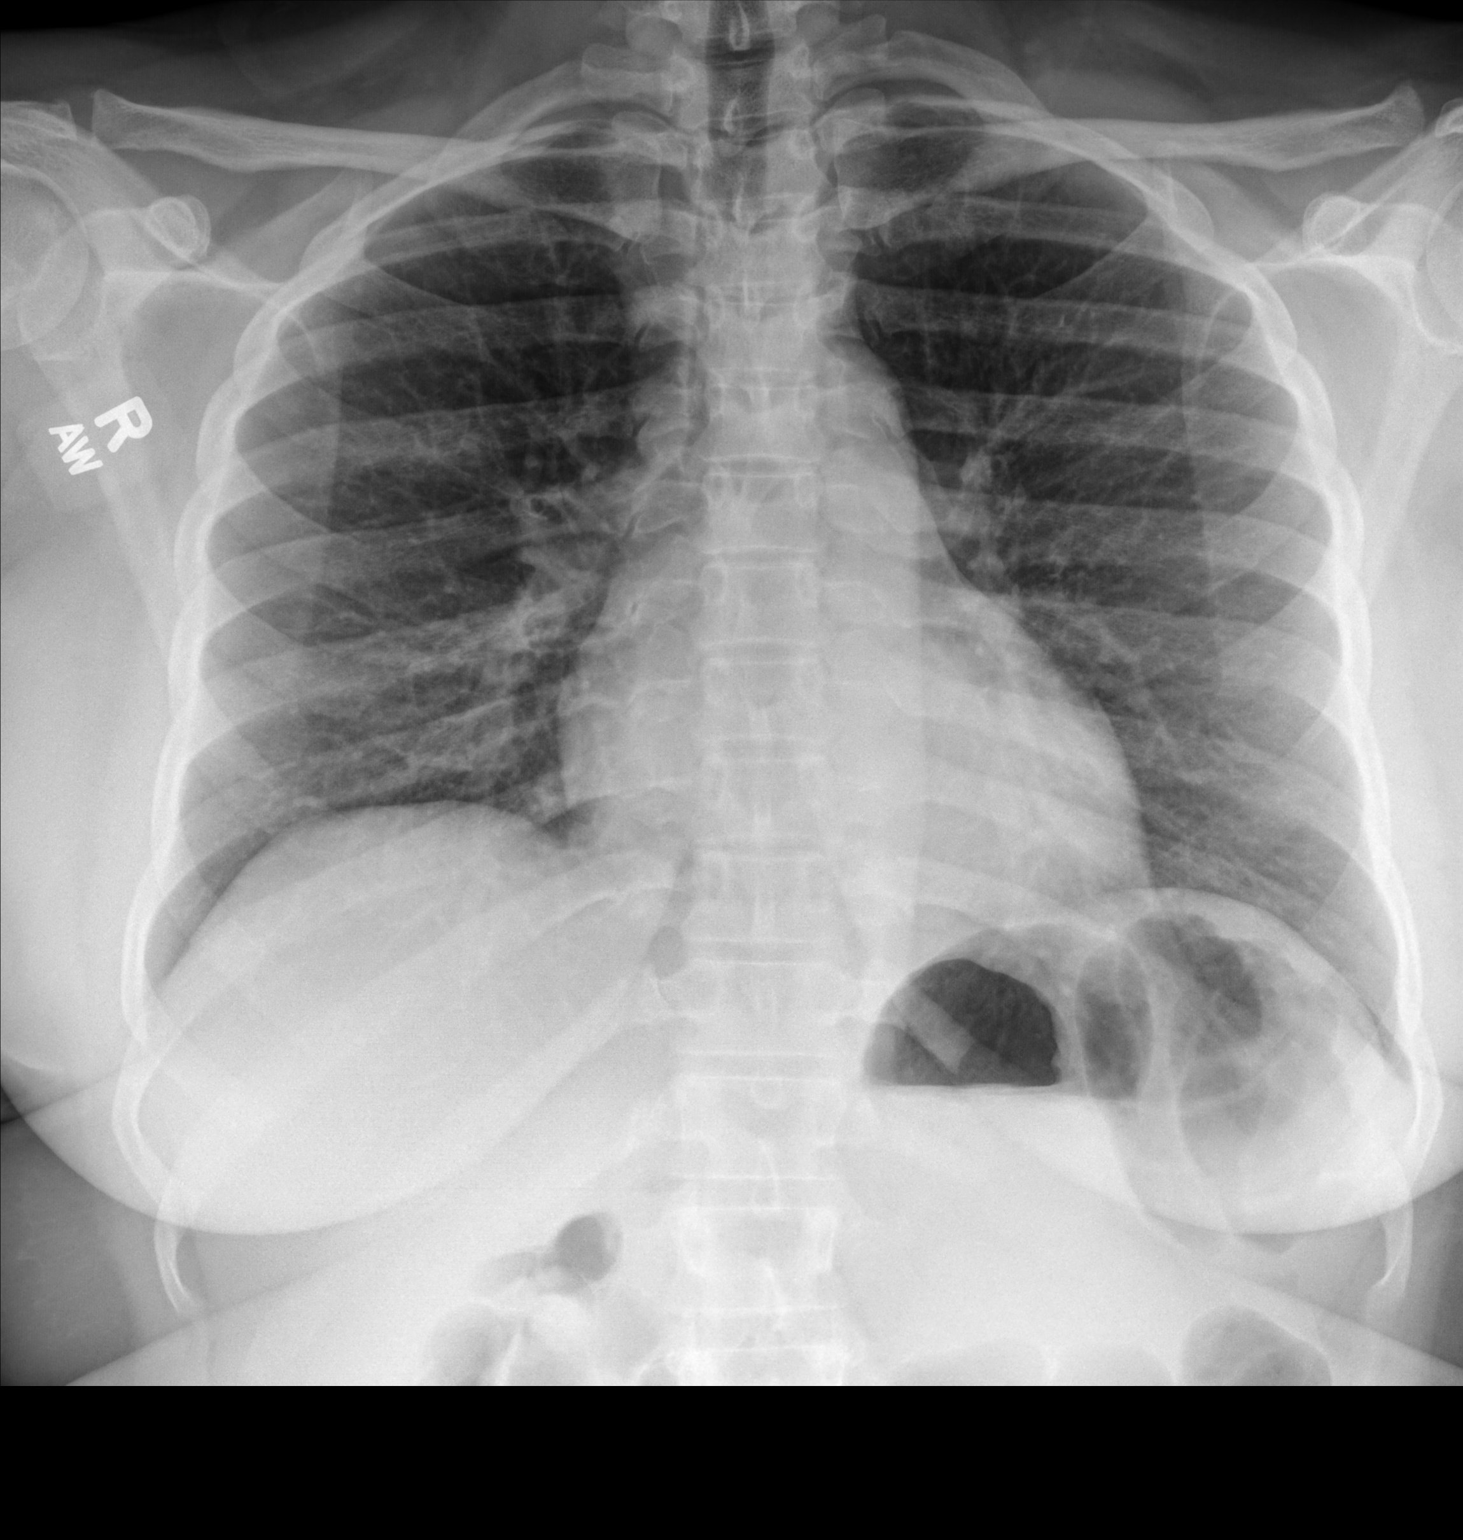

[chest lat]
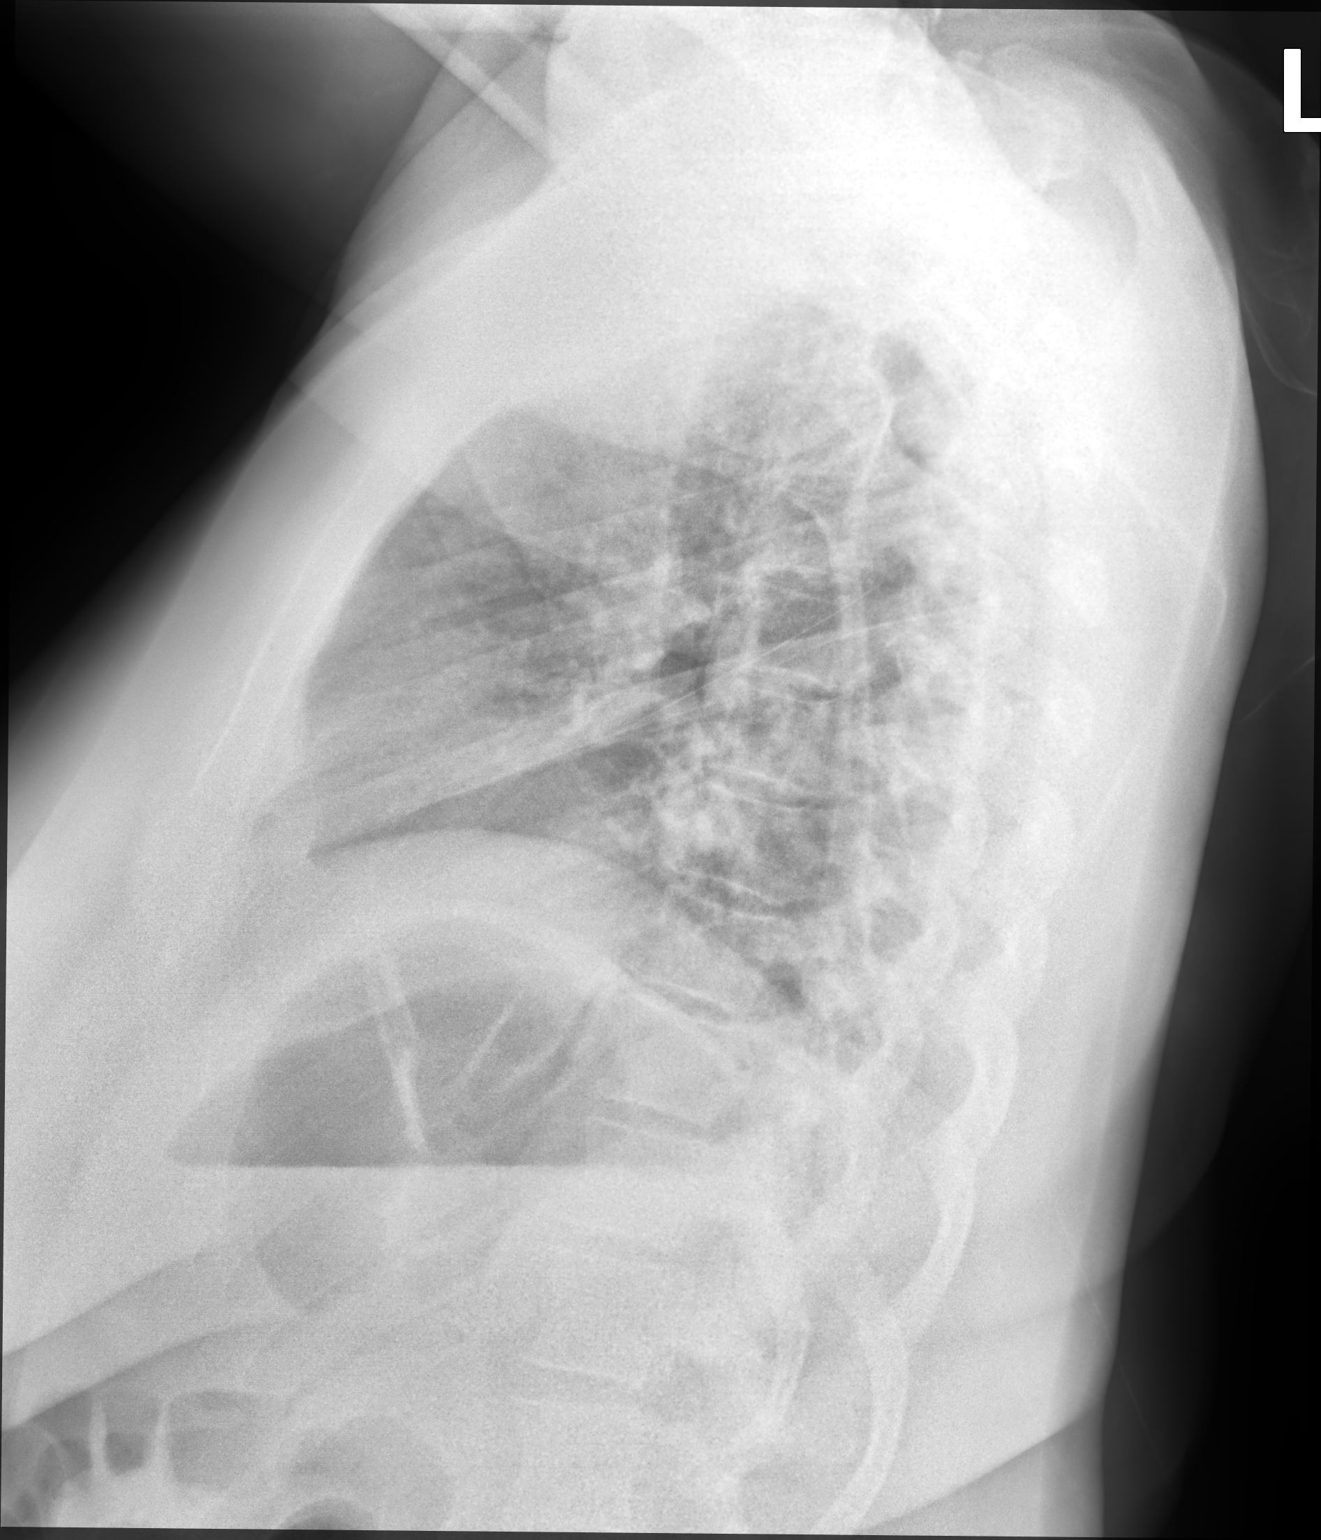

[2 of 2 positions shown; findings below may reference images not displayed]

FINDINGS: Continued somewhat low lung volumes, a especially on the lateral
view today. Normal cardiac size and mediastinal contours. Visualized
tracheal air column is within normal limits. Improved bibasilar
ventilation. Mild residual asymmetric basilar predominant
interstitial opacity. No superimposed pneumothorax or pleural
effusion. No acute osseous abnormality identified. Negative visible
bowel gas pattern.
IMPRESSION: Regressed but not completely resolved pulmonary interstitial opacity
since last month. No new cardiopulmonary abnormality.

## 2020-11-29 ENCOUNTER — Ambulatory Visit: Payer: BC Managed Care – PPO

## 2021-01-05 ENCOUNTER — Telehealth: Payer: Self-pay | Admitting: Nurse Practitioner

## 2021-01-05 NOTE — Telephone Encounter (Signed)
Patient states that her provider called her. Did not see anything in the chart. Please advise.

## 2021-01-06 NOTE — Telephone Encounter (Signed)
I did not contact patient, neither did the provider. Closing note

## 2021-01-06 NOTE — Telephone Encounter (Signed)
No, I did not

## 2021-01-09 ENCOUNTER — Emergency Department (HOSPITAL_BASED_OUTPATIENT_CLINIC_OR_DEPARTMENT_OTHER)
Admission: EM | Admit: 2021-01-09 | Discharge: 2021-01-10 | Disposition: A | Discharge status: Home / Self Care | Payer: BC Managed Care – PPO | Attending: Emergency Medicine | Admitting: Emergency Medicine

## 2021-01-09 ENCOUNTER — Other Ambulatory Visit: Payer: Self-pay

## 2021-01-09 ENCOUNTER — Encounter (HOSPITAL_BASED_OUTPATIENT_CLINIC_OR_DEPARTMENT_OTHER): Payer: Self-pay | Admitting: Emergency Medicine

## 2021-01-09 DIAGNOSIS — Z8616 Personal history of COVID-19: Secondary | ICD-10-CM | POA: Diagnosis not present

## 2021-01-09 DIAGNOSIS — L509 Urticaria, unspecified: Secondary | ICD-10-CM

## 2021-01-09 DIAGNOSIS — R21 Rash and other nonspecific skin eruption: Secondary | ICD-10-CM | POA: Diagnosis not present

## 2021-01-09 NOTE — ED Triage Notes (Signed)
Patient presents with generalized rash to upper and lower extremities; complains of itching; states onset at approx 1900 tonight; states took 2 benadryl at 2100; states no relief.

## 2021-01-10 MED ORDER — METHYLPREDNISOLONE SODIUM SUCC 125 MG IJ SOLR
125.0000 mg | Freq: Once | INTRAMUSCULAR | Status: AC
  Administered 2021-01-10: 125 mg via INTRAVENOUS
  Filled 2021-01-10: qty 2

## 2021-01-10 MED ORDER — HYDROXYZINE HCL 25 MG PO TABS
25.0000 mg | ORAL_TABLET | Freq: Four times a day (QID) | ORAL | 0 refills | Status: DC | PRN
Start: 2021-01-10 — End: 2021-01-30

## 2021-01-10 MED ORDER — DIPHENHYDRAMINE HCL 50 MG/ML IJ SOLN
25.0000 mg | Freq: Once | INTRAMUSCULAR | Status: AC
  Administered 2021-01-10: 25 mg via INTRAVENOUS
  Filled 2021-01-10: qty 1

## 2021-01-10 MED ORDER — FAMOTIDINE IN NACL 20-0.9 MG/50ML-% IV SOLN
20.0000 mg | Freq: Once | INTRAVENOUS | Status: AC
  Administered 2021-01-10: 20 mg via INTRAVENOUS
  Filled 2021-01-10: qty 50

## 2021-01-10 MED ORDER — FAMOTIDINE 20 MG PO TABS
20.0000 mg | ORAL_TABLET | Freq: Two times a day (BID) | ORAL | 0 refills | Status: DC
Start: 2021-01-10 — End: 2021-01-30

## 2021-01-10 MED ORDER — PREDNISONE 20 MG PO TABS
40.0000 mg | ORAL_TABLET | Freq: Every day | ORAL | 0 refills | Status: DC
Start: 2021-01-10 — End: 2021-01-30

## 2021-01-10 NOTE — Discharge Instructions (Addendum)
You were seen today for hives.  The cause is unknown.  Continue prednisone for the next 5 days.  Take Pepcid daily and continue your Zyrtec.  You may take Atarax or Benadryl as needed for itching.

## 2021-01-10 NOTE — ED Provider Notes (Signed)
[facility] EMERGENCY DEPARTMENT Provider Note   CSN: [id number] Arrival date & time: 01/09/21  2324     History Chief Complaint  Patient presents with  . Rash    Erika David is a 45 y.o. female.  HPI     This a 45 year old female who presents with a rash.  Patient reports onset of rash around 7:30 PM.  She states that it is itchy.  It involves her bilateral arms, hands, lower legs.  She denies any new exposures including medications, foods, soaps, detergents, lotions.  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  She saw multiple specialist who could not figure out what she was reacting to.  However, she has not had any issues in over a year.  She takes a daily Zyrtec.  She denies any shortness of breath, throat swelling, lip swelling, nausea, vomiting, systemic complaints.  She took 50 mg of Benadryl at around 8:00 with minimal relief.  Past Medical History:  Diagnosis Date  . Abnormal Pap smear     LEEP?  LAST PAP 05/2009  . Anemia 01/04/2012  . Chlamydia   . H/O candidiasis   . H/O dysmenorrhea 03/13/02  . H/O gonorrhea    treated  . H/O menorrhagia 07/09/02  . Infection 2006   GONORHRREA  . Infection 2006   CHLAMYDIA  . Infection    FREQ YEAST  . Pelvic injury AS TEEN   SPORTS RELATED  . Pelvic pain 07/11/10  . Trichomonas   . Yeast infection     Patient Active Problem List   Diagnosis Date Noted  . Menorrhagia 02/15/2020  . Acute respiratory failure due to COVID-19 ([place]) 01/27/2020  . Hypokalemia 01/27/2020  . Iron deficiency anemia due to chronic blood loss 11/30/2019  . Lymphocytic colitis 11/13/2019  . Diarrhea   . Abnormal CT scan, colon   . Generalized weakness 11/09/2019  . Pancolitis ([place]) 11/08/2019  . Vaginal discharge 05/14/2016  . Well adult on routine health check 05/14/2016  . S/P cesarean section 04/12/2012  . Sterilization 01/04/2012  . Overweight 01/04/2012  . Anemia 01/04/2012  .  Seasonal allergies 01/04/2012    Past Surgical History:  Procedure Laterality Date  . BIOPSY  11/12/2019   Procedure: BIOPSY;  Surgeon: Ladene Artist, MD;  Location: [facility];  Service: Endoscopy;;  . CESAREAN SECTION  04/11/2012   Procedure: CESAREAN SECTION;  Surgeon: Delice Lesch, MD;  Location: [facility];  Service: Gynecology;  Laterality: N/A;  twins  . COLONOSCOPY WITH PROPOFOL N/A 11/12/2019   Procedure: COLONOSCOPY WITH PROPOFOL;  Surgeon: Ladene Artist, MD;  Location: [facility];  Service: Endoscopy;  Laterality: N/A;  . GYNECOLOGIC CRYOSURGERY    . TUBAL LIGATION    . WISDOM TOOTH EXTRACTION  2007     OB History    Gravida  6   Para  4   Term  4   Preterm      AB  2   Living  5     SAB  1   IAB  1   Ectopic      Multiple  1   Live Births  5           Family History  Problem Relation Age of Onset  . Hypertension Mother   . Cancer Mother        BREAST  . Thyroid disease Mother   . Diabetes Father   . Hypertension  Sister   . Lupus Maternal Aunt   . Liver disease Sister   . Asthma Cousin   . Hypertension Maternal Grandmother   . Diabetes Maternal Grandmother   . Heart disease Maternal Grandfather   . Stroke Maternal Grandfather   . Alcohol abuse Maternal Aunt   . Drug abuse Maternal Aunt   . Anesthesia problems Neg Hx     Social History   Tobacco Use  . Smoking status: Never Smoker  . Smokeless tobacco: Never Used  Vaping Use  . Vaping Use: Never used  Substance Use Topics  . Alcohol use: Yes    Comment: occasional alcohol  . Drug use: No    Home Medications Prior to Admission medications   Medication Sig Start Date End Date Taking? Authorizing Provider  Cetirizine HCl (ZYRTEC PO) Take by mouth.   Yes [provider]  famotidine (PEPCID) 20 MG tablet Take 1 tablet (20 mg total) by mouth 2 (two) times daily. 01/10/21  Yes Georgene Kopper, Barbette Hair, MD  hydrOXYzine (ATARAX/VISTARIL) 25 MG tablet Take 1 tablet (25 mg total)  by mouth every 6 (six) hours as needed for itching. 01/10/21  Yes Aubriel Khanna, Barbette Hair, MD  predniSONE (DELTASONE) 20 MG tablet Take 2 tablets (40 mg total) by mouth daily. 01/10/21  Yes Reneisha Stilley, Barbette Hair, MD  albuterol (VENTOLIN HFA) 108 (90 Base) MCG/ACT inhaler Inhale 2 puffs into the lungs every 6 (six) hours as needed for wheezing or shortness of breath. 01/22/20   Rayna Sexton, PA-C  budesonide (ENTOCORT EC) 3 MG 24 hr capsule Take 1 capsule (3 mg total) by mouth daily. 01/04/20   Ladene Artist, MD  dicyclomine (BENTYL) 10 MG capsule Take 1 capsule (10 mg total) by mouth 4 (four) times daily -  before meals and at bedtime. 11/15/19 02/02/20  Kayleen Memos, DO  loperamide (IMODIUM) 2 MG capsule Take 1 capsule (2 mg total) by mouth 3 (three) times daily as needed for diarrhea or loose stools. 01/31/20   Thurnell Lose, MD    Allergies    Dust mite extract, Kiwi extract, Pollen extract, and Soy allergy  Review of Systems   Review of Systems  Constitutional: Negative for fever.  HENT: Negative for trouble swallowing.   Respiratory: Negative for shortness of breath.   Cardiovascular: Negative for chest pain.  Gastrointestinal: Negative for nausea and vomiting.  Skin: Positive for rash.  All other systems reviewed and are negative.   Physical Exam Updated Vital Signs BP 133/89   Pulse 80   Temp 98.4 F (36.9 C) (Oral)   Resp 18   Ht 1.549 m (5' 1" )   Wt 86.3 kg   LMP 12/20/2020   SpO2 100%   BMI 35.95 kg/m   Physical Exam Vitals and nursing note reviewed.  Constitutional:      Appearance: She is well-developed. She is obese. She is not ill-appearing.  HENT:     Head: Normocephalic and atraumatic.     Nose: Nose normal.     Mouth/Throat:     Mouth: Mucous membranes are moist.     Comments: Posterior oropharynx clear, uvula midline, no swelling noted Eyes:     Pupils: Pupils are equal, round, and reactive to light.  Cardiovascular:     Rate and Rhythm: Normal rate and  regular rhythm.     Heart sounds: Normal heart sounds.  Pulmonary:     Effort: Pulmonary effort is normal. No respiratory distress.     Breath sounds: No  wheezing.  Abdominal:     General: Bowel sounds are normal.     Palpations: Abdomen is soft.     Tenderness: There is no guarding or rebound.  Musculoskeletal:     Cervical back: Neck supple.     Right lower leg: No edema.     Left lower leg: No edema.  Skin:    General: Skin is warm and dry.     Comments: Urticarial rash noted bilateral thighs, bilateral hands, bilateral upper extremities  Neurological:     Mental Status: She is alert and oriented to person, place, and time.  Psychiatric:        Mood and Affect: Mood normal.     ED Results / Procedures / Treatments   Labs (all labs ordered are listed, but only abnormal results are displayed) Labs Reviewed - No data to display  EKG None  Radiology No results found.  Procedures Procedures   Medications Ordered in ED Medications  methylPREDNISolone sodium succinate (SOLU-MEDROL) 125 mg/2 mL injection 125 mg (125 mg Intravenous Given 01/10/21 0028)  diphenhydrAMINE (BENADRYL) injection 25 mg (25 mg Intravenous Given 01/10/21 0028)  famotidine (PEPCID) IVPB 20 mg premix (0 mg Intravenous Stopped 01/10/21 0104)    ED Course  I have reviewed the triage vital signs and the nursing notes.  Pertinent labs & imaging results that were available during my care of the patient were reviewed by me and considered in my medical decision making (see chart for details).    MDM Rules/Calculators/A&P                          Patient presents with an itchy rash.  Unknown etiology.  She is overall nontoxic and vital signs are reassuring.  No signs or symptoms of anaphylaxis.  Rash is consistent with urticaria.  Patient was given Solu-Medrol, Pepcid, and Benadryl IV.  1:17 AM Rash has completely defervesced on recheck.  Patient reports significant relief of symptoms.  Again, no signs or  symptoms of anaphylaxis.  Unknown etiology for her urticaria.  Recommend 5 days of a burst dose of steroids.  Pepcid daily as well as continue Zyrtec daily.  Atarax or Benadryl for itching.  Patient was given information regarding anaphylaxis or progression to anaphylaxis.  After history, exam, and medical workup I feel the patient has been appropriately medically screened and is safe for discharge home. Pertinent diagnoses were discussed with the patient. Patient was given return precautions.  Final Clinical Impression(s) / ED Diagnoses Final diagnoses:  Urticaria    Rx / DC Orders ED Discharge Orders         Ordered    predniSONE (DELTASONE) 20 MG tablet  Daily        01/10/21 0114    famotidine (PEPCID) 20 MG tablet  2 times daily        01/10/21 0114    hydrOXYzine (ATARAX/VISTARIL) 25 MG tablet  Every 6 hours PRN        01/10/21 0114           Merryl Hacker, MD 01/10/21 0117

## 2021-01-25 ENCOUNTER — Ambulatory Visit: Payer: BC Managed Care – PPO | Admitting: Nurse Practitioner

## 2021-01-30 ENCOUNTER — Encounter: Payer: Self-pay | Admitting: Nurse Practitioner

## 2021-01-30 ENCOUNTER — Other Ambulatory Visit: Payer: Self-pay

## 2021-01-30 ENCOUNTER — Ambulatory Visit (INDEPENDENT_AMBULATORY_CARE_PROVIDER_SITE_OTHER): Payer: BC Managed Care – PPO | Admitting: Nurse Practitioner

## 2021-01-30 ENCOUNTER — Ambulatory Visit (INDEPENDENT_AMBULATORY_CARE_PROVIDER_SITE_OTHER)
Admission: RE | Admit: 2021-01-30 | Discharge: 2021-01-30 | Disposition: A | Discharge status: Home / Self Care | Payer: BC Managed Care – PPO | Source: Ambulatory Visit | Attending: Nurse Practitioner | Admitting: Nurse Practitioner

## 2021-01-30 VITALS — BP 138/90 | HR 89 | Temp 99.1°F | Ht 61.0 in | Wt 192.8 lb

## 2021-01-30 DIAGNOSIS — G8929 Other chronic pain: Secondary | ICD-10-CM | POA: Insufficient documentation

## 2021-01-30 DIAGNOSIS — M545 Low back pain, unspecified: Secondary | ICD-10-CM

## 2021-01-30 DIAGNOSIS — R030 Elevated blood-pressure reading, without diagnosis of hypertension: Secondary | ICD-10-CM

## 2021-01-30 DIAGNOSIS — Z9851 Tubal ligation status: Secondary | ICD-10-CM | POA: Insufficient documentation

## 2021-01-30 DIAGNOSIS — M549 Dorsalgia, unspecified: Secondary | ICD-10-CM | POA: Insufficient documentation

## 2021-01-30 NOTE — Progress Notes (Signed)
Subjective:  Patient ID: Erika David, female    DOB: Feb 23, 1976  Age: 45 y.o. MRN: [id number]  CC: Hypertension (Pt explains, that her Bp has been elevated noticing at her recent ER visit. )  Back Pain This is a chronic problem. The current episode started more than 1 year ago (77yr ago). The problem has been gradually worsening since onset. The pain is present in the lumbar spine. The quality of the pain is described as aching. The pain does not radiate. The pain is moderate. The pain is the same all the time. The symptoms are aggravated by bending, lying down, twisting and standing. Stiffness is present all day. Pertinent negatives include no abdominal pain, bladder incontinence, bowel incontinence, dysuria, fever, leg pain, numbness, paresis, paresthesias, pelvic pain, perianal numbness, tingling, weakness or weight loss. Risk factors include lack of exercise, obesity, poor posture and sedentary lifestyle. She has tried analgesics, home exercises and NSAIDs for the symptoms. The treatment provided no relief.  completed outpatient PT x 6-8weeks, no improvement Onset after birth of twins 89yrago, epidural and sinal block used, s/p C-section.   She is also concerned about elevated BP in last 3weeks. No HA or dizziness or CP or edema or palpitations. No tobacco use, social consumption of ETOH, no exercise regimen, no specific diet. No longer taking oral corticosteroid.  BP Readings from Last 3 Encounters:  01/30/21 138/90  01/10/21 133/89  04/19/20 128/77   Wt Readings from Last 3 Encounters:  01/30/21 192 lb 12.8 oz (87.5 kg)  01/09/21 190 lb 4.1 oz (86.3 kg)  04/19/20 190 lb 3.2 oz (86.3 kg)   Reviewed past Medical, Social and Family history today.  Outpatient Medications Prior to Visit  Medication Sig Dispense Refill  . albuterol (VENTOLIN HFA) 108 (90 Base) MCG/ACT inhaler Inhale 2 puffs into the lungs every 6 (six) hours as needed for wheezing or shortness of breath. 6.7 g 0  .  Cetirizine HCl (ZYRTEC PO) Take by mouth.    . budesonide (ENTOCORT EC) 3 MG 24 hr capsule Take 1 capsule (3 mg total) by mouth daily. 90 capsule 3  . dicyclomine (BENTYL) 10 MG capsule Take 1 capsule (10 mg total) by mouth 4 (four) times daily -  before meals and at bedtime. 120 capsule 0  . famotidine (PEPCID) 20 MG tablet Take 1 tablet (20 mg total) by mouth 2 (two) times daily. (Patient not taking: Reported on 01/30/2021) 30 tablet 0  . hydrOXYzine (ATARAX/VISTARIL) 25 MG tablet Take 1 tablet (25 mg total) by mouth every 6 (six) hours as needed for itching. (Patient not taking: Reported on 01/30/2021) 12 tablet 0  . loperamide (IMODIUM) 2 MG capsule Take 1 capsule (2 mg total) by mouth 3 (three) times daily as needed for diarrhea or loose stools. (Patient not taking: Reported on 01/30/2021) 15 capsule 0  . predniSONE (DELTASONE) 20 MG tablet Take 2 tablets (40 mg total) by mouth daily. 10 tablet 0   No facility-administered medications prior to visit.    ROS See HPI  Objective:  BP 138/90 (BP Location: Left Arm, Patient Position: Sitting, Cuff Size: Normal)   Pulse 89   Temp 99.1 F (37.3 C) (Oral)   Ht 5' 1"  (1.549 m)   Wt 192 lb 12.8 oz (87.5 kg)   LMP 01/15/2021   SpO2 99%   BMI 36.43 kg/m   Physical Exam Vitals reviewed.  Constitutional:      Appearance: She is obese.  Cardiovascular:  Rate and Rhythm: Normal rate and regular rhythm.     Pulses: Normal pulses.     Heart sounds: Normal heart sounds.  Pulmonary:     Effort: Pulmonary effort is normal.     Breath sounds: Normal breath sounds.  Musculoskeletal:     Cervical back: Normal range of motion and neck supple.     Lumbar back: Normal.     Right lower leg: No edema.     Left lower leg: No edema.  Neurological:     Mental Status: She is alert and oriented to person, place, and time.  Psychiatric:        Mood and Affect: Mood normal.        Behavior: Behavior normal.        Thought Content: Thought content  normal.    Assessment & Plan:  This visit occurred during the SARS-CoV-2 public health emergency.  Safety protocols were in place, including screening questions prior to the visit, additional usage of staff PPE, and extensive cleaning of exam room while observing appropriate contact time as indicated for disinfecting solutions.   Erika David was seen today for hypertension.  Diagnoses and all orders for this visit:  Elevated BP without diagnosis of hypertension -     Cancel: CBC w/Diff -     Cancel: TSH  Chronic bilateral low back pain without sciatica -     DG Lumbar Spine Complete; Future  Other orders -     Cancel: Iron, TIBC and Ferritin Panel -     Cancel: Hemoglobin A1c  Advised about need for lifestyle modifications: DASH diet and exercise 3-4x/week. Provided printed information on HTN and DASH diet. F/up in 102monthwith BP readings.  Problem List Items Addressed This Visit      Other   Chronic back pain   Relevant Orders   DG Lumbar Spine Complete    Other Visit Diagnoses    Elevated BP without diagnosis of hypertension    -  Primary      Follow-up: Return in about 4 weeks (around 02/27/2021) for CPE (fasting).  CWilfred Lacy NP

## 2021-01-30 NOTE — Patient Instructions (Addendum)
Go to [address] for x-ray  Start DASh diet and regular exercise.  Monitor BP 3x/week in AM Call office if BP>150/90.  Cooking With Less Salt Cooking with less salt is one way to reduce the amount of sodium you get from food. Sodium is one of the elements that make up salt. It is found naturally in foods and is also added to certain foods. Depending on your condition and overall health, your health care provider or dietitian may recommend that you reduce your sodium intake. Most people should have less than 2,300 milligrams (mg) of sodium each day. If you have high blood pressure (hypertension), you may need to limit your sodium to 1,500 mg each day. Follow the tips below to help reduce your sodium intake. What are tips for eating less sodium? Reading food labels  Check the food label before buying or using packaged ingredients. Always check the label for the serving size and sodium content.  Look for products with no more than 140 mg of sodium in one serving.  Check the % Daily Value column to see what percent of the daily recommended amount of sodium is provided in one serving of the product. Foods with 5% or less in this column are considered low in sodium. Foods with 20% or higher are considered high in sodium.  Do not choose foods with salt as one of the first three ingredients on the ingredients list. If salt is one of the first three ingredients, it usually means the item is high in sodium.   Shopping  Buy sodium-free or low-sodium products. Look for the following words on food labels: ? Low-sodium. ? Sodium-free. ? Reduced-sodium. ? No salt added. ? Unsalted.  Always check the sodium content even if foods are labeled as low-sodium or no salt added.  Buy fresh foods. Cooking  Use herbs, seasonings without salt, and spices as substitutes for salt.  Use sodium-free baking soda when baking.  Grill, braise, or roast foods to add flavor with less salt.  Avoid adding salt to  pasta, rice, or hot cereals.  Drain and rinse canned vegetables, beans, and meat before use.  Avoid adding salt when cooking sweets and desserts.  Cook with low-sodium ingredients. What foods are high in sodium? Vegetables Regular canned vegetables (not low-sodium or reduced-sodium). Sauerkraut, pickled vegetables, and relishes. Olives. [place] fries. Onion rings. Regular canned tomato sauce and paste. Regular tomato and vegetable juice. Frozen vegetables in sauces. Grains Instant hot cereals. Bread stuffing, pancake, and biscuit mixes. Croutons. Seasoned rice or pasta mixes. Noodle soup cups. Boxed or frozen macaroni and cheese. Regular salted crackers. Self-rising flour. Rolls. Bagels. Flour tortillas and wraps. Meats and other proteins Meat or fish that is salted, canned, smoked, cured, spiced, or pickled. This includes bacon, ham, sausages, hot dogs, corned beef, chipped beef, meat loaves, salt pork, jerky, pickled herring, anchovies, regular canned tuna, and sardines. Salted nuts. Dairy Processed cheese and cheese spreads. Cheese curds. Blue cheese. Feta cheese. String cheese. Regular cottage cheese. Buttermilk. Canned milk. The items listed above may not be a complete list of foods high in sodium. Actual amounts of sodium may be different depending on processing. Contact a dietitian for more information. What foods are low in sodium? Fruits Fresh, frozen, or canned fruit with no sauce added. Fruit juice. Vegetables Fresh or frozen vegetables with no sauce added. "No salt added" canned vegetables. "No salt added" tomato sauce and paste. Low-sodium or reduced-sodium tomato and vegetable juice. Grains Noodles, pasta, quinoa,  rice. Shredded or puffed wheat or puffed rice. Regular or quick oats (not instant). Low-sodium crackers. Low-sodium bread. Whole-grain bread and whole-grain pasta. Unsalted popcorn. Meats and other proteins Fresh or frozen whole meats, poultry (not injected with  sodium), and fish with no sauce added. Unsalted nuts. Dried peas, beans, and lentils without added salt. Unsalted canned beans. Eggs. Unsalted nut butters. Low-sodium canned tuna or chicken. Dairy Milk. Soy milk. Yogurt. Low-sodium cheeses, such as Swiss, Monterey Jack, [place], and [employer]. Sherbet or ice cream (keep to  cup per serving). Cream cheese. Fats and oils Unsalted butter or margarine. Other foods Homemade pudding. Sodium-free baking soda and baking powder. Herbs and spices. Low-sodium seasoning mixes. Beverages Coffee and tea. Carbonated beverages. The items listed above may not be a complete list of foods low in sodium. Actual amounts of sodium may be different depending on processing. Contact a dietitian for more information. What are some salt alternatives when cooking? The following are herbs, seasonings, and spices that can be used instead of salt to flavor your food. Herbs should be fresh or dried. Do not choose packaged mixes. Next to the name of the herb, spice, or seasoning are some examples of foods you can pair it with. Herbs  Bay leaves - Soups, meat and vegetable dishes, and spaghetti sauce.  Basil - [employer], soups, pasta, and fish dishes.  Cilantro - Meat, poultry, and vegetable dishes.  Chili powder - Marinades and Mexican dishes.  Chives - Salad dressings and potato dishes.  Cumin - Mexican dishes, couscous, and meat dishes.  Dill - Fish dishes, sauces, and salads.  Fennel - Meat and vegetable dishes, breads, and cookies.  Garlic (do not use garlic salt) - [place] dishes, meat dishes, salad dressings, and sauces.  Marjoram - Soups, potato dishes, and meat dishes.  Oregano - Pizza and spaghetti sauce.  Parsley - Salads, soups, pasta, and meat dishes.  Rosemary - [place] dishes, salad dressings, soups, and red meats.  Saffron - Fish dishes, pasta, and some poultry dishes.  Sage - Stuffings and sauces.  Tarragon - Fish and [employer].  Thyme - Stuffing, meat, and fish dishes. Seasonings  Lemon juice - Fish dishes, poultry dishes, vegetables, and salads.  Vinegar - Salad dressings, vegetables, and fish dishes. Spices  Cinnamon - Sweet dishes, such as cakes, cookies, and puddings.  Cloves - Gingerbread, puddings, and marinades for meats.  Curry - Vegetable dishes, fish and poultry dishes, and stir-fry dishes.  Ginger - Vegetable dishes, fish dishes, and stir-fry dishes.  Nutmeg - Pasta, vegetables, poultry, fish dishes, and custard. Summary  Cooking with less salt is one way to reduce the amount of sodium that you get from food.  Buy sodium-free or low-sodium products.  Check the food label before using or buying packaged ingredients.  Use herbs, seasonings without salt, and spices as substitutes for salt in foods. This information is not intended to replace advice given to you by your health care provider. Make sure you discuss any questions you have with your health care provider. Document Revised: 08/19/2019 Document Reviewed: 08/19/2019 Elsevier Patient Education  2021 [employer].  Hypertension, Adult Hypertension is another name for high blood pressure. High blood pressure forces your heart to work harder to pump blood. This can cause problems over time. There are two numbers in a blood pressure reading. There is a top number (systolic) over a bottom number (diastolic). It is best to have a blood pressure that is below 120/80. Healthy choices can  help lower your blood pressure, or you may need medicine to help lower it. What are the causes? The cause of this condition is not known. Some conditions may be related to high blood pressure. What increases the risk?  Smoking.  Having type 2 diabetes mellitus, high cholesterol, or both.  Not getting enough exercise or physical activity.  Being overweight.  Having too much fat, sugar, calories, or salt (sodium) in your diet.  Drinking too  much alcohol.  Having long-term (chronic) kidney disease.  Having a family history of high blood pressure.  Age. Risk increases with age.  Race. You may be at higher risk if you are African American.  Gender. Men are at higher risk than women before age 21. After age 4, women are at higher risk than men.  Having obstructive sleep apnea.  Stress. What are the signs or symptoms?  High blood pressure may not cause symptoms. Very high blood pressure (hypertensive crisis) may cause: ? Headache. ? Feelings of worry or nervousness (anxiety). ? Shortness of breath. ? Nosebleed. ? A feeling of being sick to your stomach (nausea). ? Throwing up (vomiting). ? Changes in how you see. ? Very bad chest pain. ? Seizures. How is this treated?  This condition is treated by making healthy lifestyle changes, such as: ? Eating healthy foods. ? Exercising more. ? Drinking less alcohol.  Your health care provider may prescribe medicine if lifestyle changes are not enough to get your blood pressure under control, and if: ? Your top number is above 130. ? Your bottom number is above 80.  Your personal target blood pressure may vary. Follow these instructions at home: Eating and drinking  If told, follow the DASH eating plan. To follow this plan: ? Fill one half of your plate at each meal with fruits and vegetables. ? Fill one fourth of your plate at each meal with whole grains. Whole grains include whole-wheat pasta, brown rice, and whole-grain bread. ? Eat or drink low-fat dairy products, such as skim milk or low-fat yogurt. ? Fill one fourth of your plate at each meal with low-fat (lean) proteins. Low-fat proteins include fish, chicken without skin, eggs, beans, and tofu. ? Avoid fatty meat, cured and processed meat, or chicken with skin. ? Avoid pre-made or processed food.  Eat less than 1,500 mg of salt each day.  Do not drink alcohol if: ? Your doctor tells you not to drink. ? You  are pregnant, may be pregnant, or are planning to become pregnant.  If you drink alcohol: ? Limit how much you use to:  0-1 drink a day for women.  0-2 drinks a day for men. ? Be aware of how much alcohol is in your drink. In the [place] one drink equals one 12 oz bottle of beer (355 mL), one 5 oz glass of wine (148 mL), or one 1 oz glass of hard liquor (44 mL).   Lifestyle  Work with your doctor to stay at a healthy weight or to lose weight. Ask your doctor what the best weight is for you.  Get at least 30 minutes of exercise most days of the week. This may include walking, swimming, or biking.  Get at least 30 minutes of exercise that strengthens your muscles (resistance exercise) at least 3 days a week. This may include lifting weights or doing Pilates.  Do not use any products that contain nicotine or tobacco, such as cigarettes, e-cigarettes, and chewing tobacco. If you need help quitting,  ask your doctor.  Check your blood pressure at home as told by your doctor.  Keep all follow-up visits as told by your doctor. This is important.   Medicines  Take over-the-counter and prescription medicines only as told by your doctor. Follow directions carefully.  Do not skip doses of blood pressure medicine. The medicine does not work as well if you skip doses. Skipping doses also puts you at risk for problems.  Ask your doctor about side effects or reactions to medicines that you should watch for. Contact a doctor if you:  Think you are having a reaction to the medicine you are taking.  Have headaches that keep coming back (recurring).  Feel dizzy.  Have swelling in your ankles.  Have trouble with your vision. Get help right away if you:  Get a very bad headache.  Start to feel mixed up (confused).  Feel weak or numb.  Feel faint.  Have very bad pain in your: ? Chest. ? Belly (abdomen).  Throw up more than once.  Have trouble breathing. Summary  Hypertension is  another name for high blood pressure.  High blood pressure forces your heart to work harder to pump blood.  For most people, a normal blood pressure is less than 120/80.  Making healthy choices can help lower blood pressure. If your blood pressure does not get lower with healthy choices, you may need to take medicine. This information is not intended to replace advice given to you by your health care provider. Make sure you discuss any questions you have with your health care provider. Document Revised: 05/07/2018 Document Reviewed: 05/07/2018 Elsevier Patient Education  2021 [employer].

## 2021-02-01 MED ORDER — CYCLOBENZAPRINE HCL 5 MG PO TABS: 5.0000 mg | ORAL_TABLET | Freq: Every day | ORAL | 0 refills | Status: DC

## 2021-02-01 NOTE — Addendum Note (Signed)
Addended by: Leana Gamer on: 02/01/2021 06:34 PM   Modules accepted: Orders

## 2021-02-28 ENCOUNTER — Encounter: Payer: BC Managed Care – PPO | Admitting: Nurse Practitioner

## 2021-05-18 ENCOUNTER — Other Ambulatory Visit: Payer: Self-pay | Admitting: Nurse Practitioner

## 2021-05-18 DIAGNOSIS — Z1231 Encounter for screening mammogram for malignant neoplasm of breast: Secondary | ICD-10-CM

## 2021-05-22 ENCOUNTER — Encounter: Payer: Self-pay | Admitting: Nurse Practitioner

## 2021-05-22 ENCOUNTER — Other Ambulatory Visit: Payer: Self-pay

## 2021-05-22 ENCOUNTER — Ambulatory Visit (INDEPENDENT_AMBULATORY_CARE_PROVIDER_SITE_OTHER): Payer: BC Managed Care – PPO | Admitting: Nurse Practitioner

## 2021-05-22 VITALS — BP 120/82 | HR 86 | Temp 97.3°F | Wt 191.6 lb

## 2021-05-22 DIAGNOSIS — M545 Low back pain, unspecified: Secondary | ICD-10-CM

## 2021-05-22 DIAGNOSIS — G8929 Other chronic pain: Secondary | ICD-10-CM

## 2021-05-22 DIAGNOSIS — Z6836 Body mass index (BMI) 36.0-36.9, adult: Secondary | ICD-10-CM

## 2021-05-22 DIAGNOSIS — D500 Iron deficiency anemia secondary to blood loss (chronic): Secondary | ICD-10-CM | POA: Diagnosis not present

## 2021-05-22 DIAGNOSIS — E559 Vitamin D deficiency, unspecified: Secondary | ICD-10-CM | POA: Insufficient documentation

## 2021-05-22 DIAGNOSIS — R739 Hyperglycemia, unspecified: Secondary | ICD-10-CM

## 2021-05-22 LAB — VITAMIN D 25 HYDROXY (VIT D DEFICIENCY, FRACTURES): VITD: 16.94 ng/mL — ABNORMAL LOW (ref 30.00–100.00)

## 2021-05-22 LAB — CBC WITH DIFFERENTIAL/PLATELET
Basophils Absolute: 0.1 10*3/uL (ref 0.0–0.1)
Basophils Relative: 1.2 % (ref 0.0–3.0)
Eosinophils Absolute: 0.2 10*3/uL (ref 0.0–0.7)
Eosinophils Relative: 4.5 % (ref 0.0–5.0)
HCT: 34.4 % — ABNORMAL LOW (ref 36.0–46.0)
Hemoglobin: 10.8 g/dL — ABNORMAL LOW (ref 12.0–15.0)
Lymphocytes Relative: 30.6 % (ref 12.0–46.0)
Lymphs Abs: 1.4 10*3/uL (ref 0.7–4.0)
MCHC: 31.4 g/dL (ref 30.0–36.0)
MCV: 76.1 fl — ABNORMAL LOW (ref 78.0–100.0)
Monocytes Absolute: 0.3 10*3/uL (ref 0.1–1.0)
Monocytes Relative: 6.9 % (ref 3.0–12.0)
Neutro Abs: 2.5 10*3/uL (ref 1.4–7.7)
Neutrophils Relative %: 56.8 % (ref 43.0–77.0)
Platelets: 263 10*3/uL (ref 150.0–400.0)
RBC: 4.52 Mil/uL (ref 3.87–5.11)
RDW: 17.1 % — ABNORMAL HIGH (ref 11.5–15.5)
WBC: 4.5 10*3/uL (ref 4.0–10.5)

## 2021-05-22 LAB — IRON,TIBC AND FERRITIN PANEL
%SAT: 8 % — ABNORMAL LOW (ref 16–45)
Ferritin: 5 ng/mL — ABNORMAL LOW (ref 16–232)
Iron: 35 ug/dL — ABNORMAL LOW (ref 40–190)
TIBC: 443 ug/dL (ref 250–450)

## 2021-05-22 LAB — TSH: TSH: 2.21 u[IU]/mL (ref 0.35–5.50)

## 2021-05-22 LAB — HEMOGLOBIN A1C: Hgb A1c MFr Bld: 6.4 % (ref 4.6–6.5)

## 2021-05-22 LAB — LDL CHOLESTEROL, DIRECT: Direct LDL: 145 mg/dL

## 2021-05-22 MED ORDER — VITAMIN D (ERGOCALCIFEROL) 1.25 MG (50000 UNIT) PO CAPS
50000.0000 [IU] | ORAL_CAPSULE | ORAL | 0 refills | Status: DC
Start: 2021-05-22 — End: 2021-08-10

## 2021-05-22 NOTE — Assessment & Plan Note (Signed)
Secondary to menorrhagia. Reports persistent fatigue No OTC iron supplement use at this time.  Repeat cbc and iron panel today Advised to schedule appt with GYN and GI

## 2021-05-22 NOTE — Patient Instructions (Addendum)
Start hip and back exercise daily  you will be contacted to schedule appt with weight management clinic.  Go to lab for blood draw Schedule appt with GYN and GI. Discuss FMLA related to colitis with GI  Hip Exercises Ask your health care provider which exercises are safe for you. Do exercises exactly as told by your health care provider and adjust them as directed. It is normal to feel mild stretching, pulling, tightness, or discomfort as you do these exercises. Stop right away if you feel sudden pain or your pain gets worse. Do not begin these exercises until told by your health care provider. Stretching and range-of-motion exercises These exercises warm up your muscles and joints and improve the movement and flexibility of your hip. These exercises also help to relieve pain, numbness, and tingling. You may be asked to limit your range of motion if you had a hip replacement. Talk to your health care provider about these restrictions. Hamstrings, supine  Lie on your back (supine position). Loop a belt or towel over the ball of your left / right foot. The ball of your foot is on the walking surface, right under your toes. Straighten your left / right knee and slowly pull on the belt or towel to raise your leg until you feel a gentle stretch behind your knee (hamstring). Do not let your knee bend while you do this. Keep your other leg flat on the floor. Hold this position for __________ seconds. Slowly return your leg to the starting position. Repeat __________ times. Complete this exercise __________ times a day. Hip rotation  Lie on your back on a firm surface. With your left / right hand, gently pull your left / right knee toward the shoulder that is on the same side of the body. Stop when your knee is pointing toward the ceiling. Hold your left / right ankle with your other hand. Keeping your knee steady, gently pull your left / right ankle toward your other shoulder until you feel a  stretch in your buttocks. Keep your hips and shoulders firmly planted while you do this stretch. Hold this position for __________ seconds. Repeat __________ times. Complete this exercise __________ times a day. Seated stretch This exercise is sometimes called hamstrings and adductors stretch. Sit on the floor with your legs stretched wide. Keep your knees straight during this exercise. Keeping your head and back in a straight line, bend at your waist to reach for your left foot (position A). You should feel a stretch in your right inner thigh (adductors). Hold this position for __________ seconds. Then slowly return to the upright position. Keeping your head and back in a straight line, bend at your waist to reach forward (position B). You should feel a stretch behind both of your thighs and knees (hamstrings). Hold this position for __________ seconds. Then slowly return to the upright position. Keeping your head and back in a straight line, bend at your waist to reach for your right foot (position C). You should feel a stretch in your left inner thigh (adductors). Hold this position for __________ seconds. Then slowly return to the upright position. Repeat __________ times. Complete this exercise __________ times a day. Lunge This exercise stretches the muscles of the hip (hip flexors). Place your left / right knee on the floor and bend your other knee so that is directly over your ankle. You should be half-kneeling. Keep good posture with your head over your shoulders. Tighten your buttocks to point your  tailbone downward. This will prevent your back from arching too much. You should feel a gentle stretch in the front of your left / right thigh and hip. If you do not feel a stretch, slide your other foot forward slightly and then slowly lunge forward with your chest up until your knee once again lines up over your ankle. Make sure your tailbone continues to point downward. Hold this position  for __________ seconds. Slowly return to the starting position. Repeat __________ times. Complete this exercise __________ times a day. Strengthening exercises These exercises build strength and endurance in your hip. Endurance is the ability to use your muscles for a long time, even after they get tired. Bridge This exercise strengthens the muscles of your hip (hip extensors). Lie on your back on a firm surface with your knees bent and your feet flat on the floor. Tighten your buttocks muscles and lift your bottom off the floor until the trunk of your body and your hips are level with your thighs. Do not arch your back. You should feel the muscles working in your buttocks and the back of your thighs. If you do not feel these muscles, slide your feet 1-2 inches (2.5-5 cm) farther away from your buttocks. Hold this position for __________ seconds. Slowly lower your hips to the starting position. Let your muscles relax completely between repetitions. Repeat __________ times. Complete this exercise __________ times a day. Straight leg raises, side-lying This exercise strengthens the muscles that move the hip joint away from the center of the body (hip abductors). Lie on your side with your left / right leg in the top position. Lie so your head, shoulder, hip, and knee line up. You may bend your bottom knee slightly to help you balance. Roll your hips slightly forward, so your hips are stacked directly over each other and your left / right knee is facing forward. Leading with your heel, lift your top leg 4-6 inches (10-15 cm). You should feel the muscles in your top hip lifting. Do not let your foot drift forward. Do not let your knee roll toward the ceiling. Hold this position for __________ seconds. Slowly return to the starting position. Let your muscles relax completely between repetitions. Repeat __________ times. Complete this exercise __________ times a day. Straight leg raises,  side-lying This exercise strengthens the muscles that move the hip joint toward the center of the body (hip adductors). Lie on your side with your left / right leg in the bottom position. Lie so your head, shoulder, hip, and knee line up. You may place your upper foot in front to help you balance. Roll your hips slightly forward, so your hips are stacked directly over each other and your left / right knee is facing forward. Tense the muscles in your inner thigh and lift your bottom leg 4-6 inches (10-15 cm). Hold this position for __________ seconds. Slowly return to the starting position. Let your muscles relax completely between repetitions. Repeat __________ times. Complete this exercise __________ times a day. Straight leg raises, supine This exercise strengthens the muscles in the front of your thigh (quadriceps). Lie on your back (supine position) with your left / right leg extended and your other knee bent. Tense the muscles in the front of your left / right thigh. You should see your kneecap slide up or see increased dimpling just above your knee. Keep these muscles tight as you raise your leg 4-6 inches (10-15 cm) off the floor. Do not let your  knee bend. Hold this position for __________ seconds. Keep these muscles tense as you lower your leg. Relax the muscles slowly and completely between repetitions. Repeat __________ times. Complete this exercise __________ times a day. Hip abductors, standing This exercise strengthens the muscles that move the leg and hip joint away from the center of the body (hip abductors). Tie one end of a rubber exercise band or tubing to a secure surface, such as a chair, table, or pole. Loop the other end of the band or tubing around your left / right ankle. Keeping your ankle with the band or tubing directly opposite the secured end, step away until there is tension in the tubing or band. Hold on to a chair, table, or pole as needed for balance. Lift  your left / right leg out to your side. While you do this: Keep your back upright. Keep your shoulders over your hips. Keep your toes pointing forward. Make sure to use your hip muscles to slowly lift your leg. Do not tip your body or forcefully lift your leg. Hold this position for __________ seconds. Slowly return to the starting position. Repeat __________ times. Complete this exercise __________ times a day. Squats This exercise strengthens the muscles in the front of your thigh (quadriceps). Stand in a door frame so your feet and knees are in line with the frame. You may place your hands on the frame for balance. Slowly bend your knees and lower your hips like you are going to sit in a chair. Keep your lower legs in a straight-up-and-down position. Do not let your hips go lower than your knees. Do not bend your knees lower than told by your health care provider. If your hip pain increases, do not bend as low. Hold this position for ___________ seconds. Slowly push with your legs to return to standing. Do not use your hands to pull yourself to standing. Repeat __________ times. Complete this exercise __________ times a day. This information is not intended to replace advice given to you by your health care provider. Make sure you discuss any questions you have with your health care provider. Document Revised: 04/02/2019 Document Reviewed: 07/08/2018 Elsevier Patient Education  2022 [employer].  Back Exercises The following exercises strengthen the muscles that help to support the trunk (torso) and back. They also help to keep the lower back flexible. Doing these exercises can help to prevent or lessen existing low back pain. If you have back pain or discomfort, try doing these exercises 2-3 times each day or as told by your health care provider. As your pain improves, do them once each day, but increase the number of times that you repeat the steps for each exercise (do more  repetitions). To prevent the recurrence of back pain, continue to do these exercises once each day or as told by your health care provider. Do exercises exactly as told by your health care provider and adjust them as directed. It is normal to feel mild stretching, pulling, tightness, or discomfort as you do these exercises, but you should stop right away if you feel sudden pain or your pain gets worse. Exercises Single knee to chest Repeat these steps 3-5 times for each leg: Lie on your back on a firm bed or the floor with your legs extended. Bring one knee to your chest. Your other leg should stay extended and in contact with the floor. Hold your knee in place by grabbing your knee or thigh with both hands and  hold. Pull on your knee until you feel a gentle stretch in your lower back or buttocks. Hold the stretch for 10-30 seconds. Slowly release and straighten your leg. Pelvic tilt Repeat these steps 5-10 times: Lie on your back on a firm bed or the floor with your legs extended. Bend your knees so they are pointing toward the ceiling and your feet are flat on the floor. Tighten your lower abdominal muscles to press your lower back against the floor. This motion will tilt your pelvis so your tailbone points up toward the ceiling instead of pointing to your feet or the floor. With gentle tension and even breathing, hold this position for 5-10 seconds. Cat-cow Repeat these steps until your lower back becomes more flexible: Get into a hands-and-knees position on a firm bed or the floor. Keep your hands under your shoulders, and keep your knees under your hips. You may place padding under your knees for comfort. Let your head hang down toward your chest. Contract your abdominal muscles and point your tailbone toward the floor so your lower back becomes rounded like the back of a cat. Hold this position for 5 seconds. Slowly lift your head, let your abdominal muscles relax, and point your  tailbone up toward the ceiling so your back forms a sagging arch like the back of a cow. Hold this position for 5 seconds.  Press-ups Repeat these steps 5-10 times: Lie on your abdomen (face-down) on a firm bed or the floor. Place your palms near your head, about shoulder-width apart. Keeping your back as relaxed as possible and keeping your hips on the floor, slowly straighten your arms to raise the top half of your body and lift your shoulders. Do not use your back muscles to raise your upper torso. You may adjust the placement of your hands to make yourself more comfortable. Hold this position for 5 seconds while you keep your back relaxed. Slowly return to lying flat on the floor.  Bridges Repeat these steps 10 times: Lie on your back on a firm bed or the floor. Bend your knees so they are pointing toward the ceiling and your feet are flat on the floor. Your arms should be flat at your sides, next to your body. Tighten your buttocks muscles and lift your buttocks off the floor until your waist is at almost the same height as your knees. You should feel the muscles working in your buttocks and the back of your thighs. If you do not feel these muscles, slide your feet 1-2 inches (2.5-5 cm) farther away from your buttocks. Hold this position for 3-5 seconds. Slowly lower your hips to the starting position, and allow your buttocks muscles to relax completely. If this exercise is too easy, try doing it with your arms crossed over your chest. Abdominal crunches Repeat these steps 5-10 times: Lie on your back on a firm bed or the floor with your legs extended. Bend your knees so they are pointing toward the ceiling and your feet are flat on the floor. Cross your arms over your chest. Tip your chin slightly toward your chest without bending your neck. Tighten your abdominal muscles and slowly raise your torso high enough to lift your shoulder blades a tiny bit off the floor. Avoid raising your  torso higher than that because it can put too much stress on your lower back and does not help to strengthen your abdominal muscles. Slowly return to your starting position. Back lifts Repeat these steps 5-10  times: Lie on your abdomen (face-down) with your arms at your sides, and rest your forehead on the floor. Tighten the muscles in your legs and your buttocks. Slowly lift your chest off the floor while you keep your hips pressed to the floor. Keep the back of your head in line with the curve in your back. Your eyes should be looking at the floor. Hold this position for 3-5 seconds. Slowly return to your starting position. Contact a health care provider if: Your back pain or discomfort gets much worse when you do an exercise. Your worsening back pain or discomfort does not lessen within 2 hours after you exercise. If you have any of these problems, stop doing these exercises right away. Do not do them again unless your health care provider says that you can. Get help right away if: You develop sudden, severe back pain. If this happens, stop doing the exercises right away. Do not do them again unless your health care provider says that you can. This information is not intended to replace advice given to you by your health care provider. Make sure you discuss any questions you have with your health care provider. Document Revised: 11/09/2020 Document Reviewed: 11/09/2020 Elsevier Patient Education  [place].

## 2021-05-22 NOTE — Progress Notes (Signed)
Subjective:  Patient ID: Erika David, female    DOB: 1976/05/28  Age: 45 y.o. MRN: [id number]  CC: Acute Visit (Pt c/o back pain for several months. Xrays done in 01/2021 but states she never followed up after and her pain has not improved. /Declines flu vaccine today. )  HPI  Iron deficiency anemia due to chronic blood loss Secondary to menorrhagia. Reports persistent fatigue No OTC iron supplement use at this time.  Repeat cbc and iron panel today Advised to schedule appt with GYN and GI  Chronic back pain Lower back and hips without sciatica, pain is worse in supine position, no neuropathy, no leg weakness. Mild improvement with tylenol 3tabs at hs. She did not take flexeril as previous prescribed. Note from previous OV: "completed outpatient PT x 6-8weeks, no improvement Onset after birth of twins 19yr ago, epidural and sinal block used, s/p C-section." X-ray lumbar done 01/2021: No evidence of acute fracture or malalignment. Disc heights are maintained. Lower lumbar facet arthropathy.  Advised to start daily back and hip exercise. Entered referral to weight manage clinic to help with weight loss. Encourage to also take flexeril at bedtime. Refer to ortho-spine if no improvement with weight loss and/or worsening pain with exercise. BP Readings from Last 3 Encounters:  05/22/21 120/82  01/30/21 138/90  01/10/21 133/89    Wt Readings from Last 3 Encounters:  05/22/21 191 lb 9.6 oz (86.9 kg)  01/30/21 192 lb 12.8 oz (87.5 kg)  01/09/21 190 lb 4.1 oz (86.3 kg)    Reviewed past Medical, Social and Family history today.  Outpatient Medications Prior to Visit  Medication Sig Dispense Refill   albuterol (VENTOLIN HFA) 108 (90 Base) MCG/ACT inhaler Inhale 2 puffs into the lungs every 6 (six) hours as needed for wheezing or shortness of breath. 6.7 g 0   Cetirizine HCl (ZYRTEC PO) Take by mouth.     cyclobenzaprine (FLEXERIL) 5 MG tablet Take 1-2 tablets (5-10 mg total) by  mouth at bedtime. 30 tablet 0   dicyclomine (BENTYL) 10 MG capsule Take 1 capsule (10 mg total) by mouth 4 (four) times daily -  before meals and at bedtime. 120 capsule 0   No facility-administered medications prior to visit.    ROS See HPI  Objective:  BP 120/82 (BP Location: Left Arm, Patient Position: Sitting, Cuff Size: Large)   Pulse 86   Temp (!) 97.3 F (36.3 C) (Temporal)   Wt 191 lb 9.6 oz (86.9 kg)   SpO2 100%   BMI 36.20 kg/m   Physical Exam Vitals reviewed.  Cardiovascular:     Rate and Rhythm: Normal rate.     Pulses: Normal pulses.  Pulmonary:     Effort: Pulmonary effort is normal.  Musculoskeletal:     Thoracic back: Normal.     Lumbar back: Tenderness present. No swelling, spasms or bony tenderness. Normal range of motion. Negative right straight leg raise test and negative left straight leg raise test.     Right hip: Tenderness present. No bony tenderness or crepitus. Normal range of motion. Normal strength.     Left hip: Tenderness present. No bony tenderness or crepitus. Normal range of motion. Normal strength.     Right upper leg: Normal.     Left upper leg: Normal.     Right knee: Normal.     Left knee: Normal.     Right lower leg: Normal. No edema.     Left lower leg: Normal. No edema.  Skin:    General: Skin is warm and dry.  Neurological:     Mental Status: She is alert and oriented to person, place, and time.  Psychiatric:        Mood and Affect: Mood normal.        Behavior: Behavior normal.        Thought Content: Thought content normal.   Assessment & Plan:  This visit occurred during the SARS-CoV-2 public health emergency.  Safety protocols were in place, including screening questions prior to the visit, additional usage of staff PPE, and extensive cleaning of exam room while observing appropriate contact time as indicated for disinfecting solutions.   Meoshia was seen today for acute visit.  Diagnoses and all orders for this  visit:  Chronic bilateral low back pain without sciatica  Class 2 severe obesity due to excess calories with serious comorbidity and body mass index (BMI) of 36.0 to 36.9 in adult (HCC) -     Hemoglobin A1c -     TSH -     Vitamin D (25 hydroxy) -     Amb Ref to Medical Weight Management -     Direct LDL  Iron deficiency anemia due to chronic blood loss -     CBC with Differential/Platelet -     Iron, TIBC and Ferritin Panel  Vitamin D deficiency -     Vitamin D (25 hydroxy) -     Vitamin D, Ergocalciferol, (DRISDOL) 1.25 MG (50000 UNIT) CAPS capsule; Take 1 capsule (50,000 Units total) by mouth every 7 (seven) days.  Hyperglycemia -     Hemoglobin A1c -     Amb Ref to Medical Weight Management  Problem List Items Addressed This Visit       Other   Chronic back pain - Primary    Lower back and hips without sciatica, pain is worse in supine position, no neuropathy, no leg weakness. Mild improvement with tylenol 3tabs at hs. She did not take flexeril as previous prescribed. Note from previous OV: "completed outpatient PT x 6-8weeks, no improvement Onset after birth of twins 33yr ago, epidural and sinal block used, s/p C-section." X-ray lumbar done 01/2021: No evidence of acute fracture or malalignment. Disc heights are maintained. Lower lumbar facet arthropathy.  Advised to start daily back and hip exercise. Entered referral to weight manage clinic to help with weight loss. Encourage to also take flexeril at bedtime. Refer to ortho-spine if no improvement with weight loss and/or worsening pain with exercise.      Iron deficiency anemia due to chronic blood loss    Secondary to menorrhagia. Reports persistent fatigue No OTC iron supplement use at this time.  Repeat cbc and iron panel today Advised to schedule appt with GYN and GI      Relevant Orders   CBC with Differential/Platelet (Completed)   Iron, TIBC and Ferritin Panel   Obesity   Relevant Orders   Hemoglobin  A1c (Completed)   TSH (Completed)   Vitamin D (25 hydroxy) (Completed)   Amb Ref to Medical Weight Management   Direct LDL (Completed)   Vitamin D deficiency   Relevant Medications   Vitamin D, Ergocalciferol, (DRISDOL) 1.25 MG (50000 UNIT) CAPS capsule   Other Relevant Orders   Vitamin D (25 hydroxy) (Completed)   Other Visit Diagnoses     Hyperglycemia       Relevant Orders   Hemoglobin A1c (Completed)   Amb Ref to Medical Weight Management  Follow-up: Return in about 6 months (around 11/19/2021) for CPE (fasting).  Wilfred Lacy, NP

## 2021-05-22 NOTE — Assessment & Plan Note (Addendum)
Lower back and hips without sciatica, pain is worse in supine position, no neuropathy, no leg weakness. Mild improvement with tylenol 3tabs at hs. She did not take flexeril as previous prescribed. Note from previous OV: "completed outpatient PT x 6-8weeks, no improvement Onset after birth of twins 51yr ago, epidural and sinal block used, s/p C-section." X-ray lumbar done 01/2021: No evidence of acute fracture or malalignment. Disc heights are maintained. Lower lumbar facet arthropathy.  Advised to start daily back and hip exercise. Entered referral to weight manage clinic to help with weight loss. Encourage to also take flexeril at bedtime. Refer to ortho-spine if no improvement with weight loss and/or worsening pain with exercise.

## 2021-05-25 ENCOUNTER — Telehealth: Payer: Self-pay

## 2021-05-25 ENCOUNTER — Ambulatory Visit: Payer: BC Managed Care – PPO | Admitting: Sports Medicine

## 2021-05-25 NOTE — Telephone Encounter (Signed)
Notified by scheduling that pt is requesting appt based on PCP recommendation for iron deficiency anemia. Pt states she is having menorrhagia and denies taking Lupron shots as her insurance is not covering the cost. Pt requests f/u with Dr Jana Hakim. Message sent to scheduling regarding this.

## 2021-05-29 NOTE — Progress Notes (Signed)
[facility]  [phone] [phone]     ID: Erika David DOB: 11-12-1975  MR#: [id number]  ZLD#:[id number]  Patient Care Team: Erika Buffy, NP as PCP - General (Internal Medicine) Erika David as Consulting Physician (Oncology) Erika Cruel, David OTHER David:  CHIEF COMPLAINT: iron deficiency  CURRENT TREATMENT: Leuprolide   INTERVAL HISTORY: Erika David returns today for follow up of her iron deficiency.   She received two doses of Feraheme in spring 2021 and did well with those infusions, with no reactions.  More recently she was found by her primary care physician to be again iron deficient.    Component Value Date/Time   IRON 35 (L) 05/22/2021 0927   TIBC 443 05/22/2021 0927   FERRITIN 5 (L) 05/22/2021 0927   IRONPCTSAT 8 (L) 05/22/2021 0927     Lab Results  Component Value Date   HGB 10.8 (L) 05/22/2021   HGB 12.7 04/19/2020   HGB 11.9 (L) 02/15/2020   HGB 12.4 02/09/2020   HGB 12.5 01/31/2020   Of note, she is scheduled for screening mammogram on 06/06/2021.   REVIEW OF SYSTEMS: Erika David tells me she has periods every month lasting about 7 days of which 6 days are heavy.  She had been written for Lupron a year ago but she says her insurance then did not cover it.  She has changed insurance and she hopes that it would cover it now.  She has arthritis in her back and her back pain is problem.  She is using heating pads for this.  She sleeps very poorly and is sleepy all the time she says.  She is worried about breast cancer and says this is in her family.  She requested a breast exam today and she is scheduled for mammography later this month.  A detailed review of systems today was otherwise stable  COVID 19 VACCINATION STATUS: Status post Pfizer x2   HISTORY OF CURRENT ILLNESS: From the original intake note:  "Erika David" was admitted to the hospital from 11/08/2019 through 11/15/2019.  She was found to have lymphocytic colitis  and underwent colonoscopy with Dr. Fuller David on 3/4 and was started on Budesonide, Entocort, and Loperamide PRN on 11/13/2019.  Her chronic anemia was noted to be related to menorrhagia and she received one dose of IV iron on 11/11/2019.  Since receiving that IV iron, her hemoglobin improved from 9.7 to 10.8.  Her anemia dates back to 2011 when her hemoglobin was as low as 10.2.  She did have anemia associated with her fourth and fifth pregnancies, however after her fifth pregnancy her mild anemia remained and didn't rebound.  We have two ferritin levels on record, with the first one being on 11/09/2019 which was 8.    Results for Erika, David (MRN [id number]) as of 12/02/2019 14:17  Ref. Range 03/17/2009 16:27 04/17/2009 23:31 11/04/2009 05:10 10/16/2011 15:28 11/20/2011 09:08 01/21/2012 11:46 03/12/2012 00:30 04/12/2012 05:35 05/29/2013 14:12 05/11/2016 16:46 06/18/2019 19:01 11/05/2019 16:07 12/02/2019 13:33  Hemoglobin Latest Ref Range: 12.0 - 15.0 g/dL 13.0 12.1 10.2 (L) 12.7 11.8 (L) 10.8 (L) 11.2 (L) 9.9 (L) 12.7 10.4 (L) 9.3 (L) 9.7 (L) 10.8 (L)    She has not yet been evaluated by gynecology.  Erika David notes that she has been having heavier cycles since having her tubal ligation.  She notes it is 7 days in length, sometimes longer.  Her pad count is about 3-5 per day for about 4-7 days.  Then  it will lighten and taper off.    The patient's subsequent history is as detailed below.   PAST MEDICAL HISTORY: Past Medical History:  Diagnosis Date   Abnormal Pap smear     LEEP?  LAST PAP 05/2009   Acute respiratory failure due to COVID-19 ([place]) 01/27/2020   Anemia 01/04/2012   Chlamydia    H/O candidiasis    H/O dysmenorrhea 03/13/02   H/O gonorrhea    treated   H/O menorrhagia 07/09/02   Infection 2006   GONORHRREA   Infection 2006   CHLAMYDIA   Infection    FREQ YEAST   Pelvic injury AS TEEN   SPORTS RELATED   Pelvic pain 07/11/10   Sterilization 01/04/2012   [facility] PP BTL Tubal papers signed    Trichomonas     Yeast infection     PAST SURGICAL HISTORY: Past Surgical History:  Procedure Laterality Date   BIOPSY  11/12/2019   Procedure: BIOPSY;  Surgeon: Erika Artist, David;  Location: [facility];  Service: Endoscopy;;   CESAREAN SECTION  04/11/2012   Procedure: CESAREAN SECTION;  Surgeon: Delice Lesch, David;  Location: [facility];  Service: Gynecology;  Laterality: N/A;  twins   COLONOSCOPY WITH PROPOFOL N/A 11/12/2019   Procedure: COLONOSCOPY WITH PROPOFOL;  Surgeon: Erika Artist, David;  Location: [facility];  Service: Endoscopy;  Laterality: N/A;   GYNECOLOGIC CRYOSURGERY     TUBAL LIGATION     WISDOM TOOTH EXTRACTION  2007    FAMILY HISTORY Family History  Problem Relation Age of Onset   Hypertension Mother    Cancer Mother        BREAST   Thyroid disease Mother    Diabetes Father    Hypertension Sister    Lupus Maternal Aunt    Liver disease Sister    Asthma Cousin    Hypertension Maternal Grandmother    Diabetes Maternal Grandmother    Heart disease Maternal Grandfather    Stroke Maternal Grandfather    Alcohol abuse Maternal Aunt    Drug abuse Maternal Aunt    Anesthesia problems Neg Hx     GYNECOLOGIC HISTORY:  No LMP recorded. (Menstrual status: Irregular Periods). Menarche: 45 years old Age at first live birth: 45 years old [place] P 5 LMP 11/20/2019 Contraceptive tubal ligation HRT/OCP: none  Hysterectomy? no Salpingo-oophorectomy?no   SOCIAL HISTORY: (Updated April 2021) Erika David is "common law married" to her significant other [employer].  She lives with him in [place], [place].  Her children are: daughter 49, daughter 60, son 29, twin boy and girl who are 7.  All of her children live with her at home.  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  Erika David enjoys reading.      ADVANCED DIRECTIVES: The patient tells me she has name her daughter Lois Huxley as her healthcare power of attorney   HEALTH MAINTENANCE: Social History   Tobacco Use    Smoking status: Never   Smokeless tobacco: Never  Vaping Use   Vaping Use: Never used  Substance Use Topics   Alcohol use: Yes    Comment: occasional alcohol   Drug use: No     Colonoscopy:11/12/2019  PAP: 2019  Bone density: not indicated   Allergies  Allergen Reactions   Bee Pollen Anaphylaxis    Tree pollen in environment Tree pollen in environment   Dust Mite Extract Anaphylaxis   Kiwi Extract Itching and Swelling   Pollen Extract Anaphylaxis  Tree pollen in environment   Other Rash   Soy Allergy Other (See Comments)    Per allergy testing Per allergy testing    Current Outpatient Medications  Medication Sig Dispense Refill   albuterol (VENTOLIN HFA) 108 (90 Base) MCG/ACT inhaler Inhale 2 puffs into the lungs every 6 (six) hours as needed for wheezing or shortness of breath. 6.7 g 0   Cetirizine HCl (ZYRTEC PO) Take by mouth.     cyclobenzaprine (FLEXERIL) 5 MG tablet Take 1-2 tablets (5-10 mg total) by mouth at bedtime. 30 tablet 0   dicyclomine (BENTYL) 10 MG capsule Take 1 capsule (10 mg total) by mouth 4 (four) times daily -  before meals and at bedtime. 120 capsule 0   Vitamin D, Ergocalciferol, (DRISDOL) 1.25 MG (50000 UNIT) CAPS capsule Take 1 capsule (50,000 Units total) by mouth every 7 (seven) days. 12 capsule 0   No current facility-administered medications for this visit.    OBJECTIVE: African-American woman who appears stated age  53:   05/30/21 1336  BP: (!) 148/89  Pulse: 78  Resp: 16  Temp: 98.1 F (36.7 C)  SpO2: 98%      Body mass index is 36.5 kg/m.   Wt Readings from Last 3 Encounters:  05/30/21 193 lb 3.2 oz (87.6 kg)  05/22/21 191 lb 9.6 oz (86.9 kg)  01/30/21 192 lb 12.8 oz (87.5 kg)      ECOG FS:1 - Symptomatic but completely ambulatory  Sclerae unicteric, EOMs intact Wearing a mask No cervical or supraclavicular adenopathy Lungs no rales or rhonchi Heart regular rate and rhythm Abd soft, nontender, positive bowel  sounds MSK no focal spinal tenderness, no upper extremity lymphedema Neuro: nonfocal, well oriented, appropriate affect Breasts: No masses in either breast, no skin or nipple changes of concern.   LAB RESULTS:  CMP     Component Value Date/Time   NA 140 02/09/2020 1516   K 3.8 02/09/2020 1516   CL 107 02/09/2020 1516   CO2 28 02/09/2020 1516   GLUCOSE 132 (H) 02/09/2020 1516   BUN 11 02/09/2020 1516   CREATININE 0.90 02/09/2020 1516   CREATININE 0.86 12/02/2019 1333   CREATININE 0.89 05/29/2013 1403   CALCIUM 9.1 02/09/2020 1516   PROT 6.2 (L) 01/31/2020 0328   ALBUMIN 3.2 (L) 01/31/2020 0328   AST 20 01/31/2020 0328   AST 10 (L) 12/02/2019 1333   ALT 32 01/31/2020 0328   ALT 11 12/02/2019 1333   ALKPHOS 57 01/31/2020 0328   BILITOT 0.6 01/31/2020 0328   BILITOT 0.2 (L) 12/02/2019 1333   GFRNONAA >60 01/31/2020 0328   GFRNONAA >60 12/02/2019 1333   GFRAA >60 01/31/2020 0328   GFRAA >60 12/02/2019 1333    No results found for: TOTALPROTELP, ALBUMINELP, A1GS, A2GS, BETS, BETA2SER, GAMS, MSPIKE, SPEI  No results found for: Nils Pyle, [facility]  Lab Results  Component Value Date   WBC 4.5 05/22/2021   NEUTROABS 2.5 05/22/2021   HGB 10.8 (L) 05/22/2021   HCT 34.4 (L) 05/22/2021   MCV 76.1 (L) 05/22/2021   PLT 263.0 05/22/2021      Chemistry      Component Value Date/Time   NA 140 02/09/2020 1516   K 3.8 02/09/2020 1516   CL 107 02/09/2020 1516   CO2 28 02/09/2020 1516   BUN 11 02/09/2020 1516   CREATININE 0.90 02/09/2020 1516   CREATININE 0.86 12/02/2019 1333   CREATININE 0.89 05/29/2013 1403      Component Value Date/Time  CALCIUM 9.1 02/09/2020 1516   ALKPHOS 57 01/31/2020 0328   AST 20 01/31/2020 0328   AST 10 (L) 12/02/2019 1333   ALT 32 01/31/2020 0328   ALT 11 12/02/2019 1333   BILITOT 0.6 01/31/2020 0328   BILITOT 0.2 (L) 12/02/2019 1333       No results found for: LABCA2  No components found for: MBWGYK599  No results  for input(s): INR in the last 168 hours.  No results found for: LABCA2  No results found for: JTT017  No results found for: BLT903  No results found for: ESP233  No results found for: CA2729  No components found for: HGQUANT  No results found for: CEA1 / No results found for: CEA1   No results found for: AFPTUMOR  No results found for: CHROMOGRNA  No results found for: HGBA, HGBA2QUANT, HGBFQUANT, HGBSQUAN (Hemoglobinopathy evaluation)   Lab Results  Component Value Date   LDH 259 (H) 01/27/2020    Lab Results  Component Value Date   IRON 35 (L) 05/22/2021   TIBC 443 05/22/2021   IRONPCTSAT 8 (L) 05/22/2021   (Iron and TIBC)  Lab Results  Component Value Date   FERRITIN 5 (L) 05/22/2021    Urinalysis    Component Value Date/Time   COLORURINE YELLOW 01/27/2020 1800   APPEARANCEUR HAZY (A) 01/27/2020 1800   LABSPEC 1.027 01/27/2020 1800   PHURINE 6.0 01/27/2020 1800   GLUCOSEU NEGATIVE 01/27/2020 1800   HGBUR NEGATIVE 01/27/2020 1800   BILIRUBINUR NEGATIVE 01/27/2020 1800   BILIRUBINUR neg 05/29/2013 1412   KETONESUR 5 (A) 01/27/2020 1800   PROTEINUR 30 (A) 01/27/2020 1800   UROBILINOGEN 0.2 05/29/2013 1412   UROBILINOGEN 0.2 04/08/2012 2330   NITRITE NEGATIVE 01/27/2020 1800   LEUKOCYTESUR MODERATE (A) 01/27/2020 1800    STUDIES: No results found.   ASSESSMENT: 45 y.o. [place] woman here today for evaluation regarding iron deficiency.    1. Microcytic anemia dating back to 2013, unable to tolerate oral iron  (a) No work-up until 11/09/2019, when ferritin was 8  (b) Received IV iron--Feraheme 536m on 11/11/2019 and 12/14/2019  2. Menorrhagia:  (a) starting leuprolide every 4 weeks, first dose 04/19/2020  2. Lymphocytic colitis managed by Dr. SFuller David (a) most recent colonoscopy 11/12/2019   David: SOk Edwardsdid very well with her Feraheme infusions last year.  We had also planned to treat her with leuprolide but she tells me this was not covered by  her insurance so she continues to have problems with very heavy periods and she is now again iron deficient.  I have set her up for Feraheme next week and the week following.  She is also agreeable to start Lupron assuming we can get it covered.  She understands this will not damage her ovaries, but it will "put them to sleep" so after the second shot she will have no further menstrual periods until she discontinues the leuprolide for what ever reason she chooses to.  She will also not need to use contraceptives after the second leuprolide shot.  She understands the possible toxicities side effects and complications of leuprolide and is interested in proceeding.  Accordingly she will start both the Feraheme and leuprolide next week.  She will see me in December with her leuprolide dose and at that point likely we will start seeing her on a once a year basis until she chooses to discontinue the leuprolide.  Total encounter time 25 minutes.*Sarajane JewsC. Magrinat, David 05/30/21 2:07 PM  Medical Oncology and Hematology [facility] [place], [place] [address] Tel. [phone]    Fax. [phone]   I, Wilburn Mylar, am acting as scribe for Dr. Virgie David. Magrinat.  I, Lurline Del David, have reviewed the above documentation for accuracy and completeness, and I agree with the above.   *Total Encounter Time as defined by the Centers for Medicare and Medicaid Services includes, in addition to the face-to-face time of a patient visit (documented in the note above) non-face-to-face time: obtaining and reviewing outside history, ordering and reviewing medications, tests or procedures, care coordination (communications with other health care professionals or caregivers) and documentation in the medical record.

## 2021-05-30 ENCOUNTER — Other Ambulatory Visit: Payer: Self-pay

## 2021-05-30 ENCOUNTER — Inpatient Hospital Stay: Payer: BC Managed Care – PPO | Attending: Oncology | Admitting: Oncology

## 2021-05-30 VITALS — BP 148/89 | HR 78 | Temp 98.1°F | Resp 16 | Ht 61.0 in | Wt 193.2 lb

## 2021-05-30 DIAGNOSIS — D509 Iron deficiency anemia, unspecified: Secondary | ICD-10-CM | POA: Insufficient documentation

## 2021-05-30 DIAGNOSIS — Z79818 Long term (current) use of other agents affecting estrogen receptors and estrogen levels: Secondary | ICD-10-CM | POA: Insufficient documentation

## 2021-05-30 DIAGNOSIS — M479 Spondylosis, unspecified: Secondary | ICD-10-CM | POA: Insufficient documentation

## 2021-05-30 DIAGNOSIS — D500 Iron deficiency anemia secondary to blood loss (chronic): Secondary | ICD-10-CM

## 2021-05-30 DIAGNOSIS — K52832 Lymphocytic colitis: Secondary | ICD-10-CM | POA: Insufficient documentation

## 2021-05-30 DIAGNOSIS — Z79899 Other long term (current) drug therapy: Secondary | ICD-10-CM | POA: Insufficient documentation

## 2021-05-30 DIAGNOSIS — N920 Excessive and frequent menstruation with regular cycle: Secondary | ICD-10-CM | POA: Insufficient documentation

## 2021-05-31 ENCOUNTER — Encounter: Payer: Self-pay | Admitting: Gastroenterology

## 2021-05-31 ENCOUNTER — Ambulatory Visit (INDEPENDENT_AMBULATORY_CARE_PROVIDER_SITE_OTHER): Payer: BC Managed Care – PPO | Admitting: Gastroenterology

## 2021-05-31 VITALS — BP 124/92 | HR 80 | Ht 61.0 in | Wt 192.2 lb

## 2021-05-31 DIAGNOSIS — R1030 Lower abdominal pain, unspecified: Secondary | ICD-10-CM | POA: Diagnosis not present

## 2021-05-31 DIAGNOSIS — K52832 Lymphocytic colitis: Secondary | ICD-10-CM

## 2021-05-31 MED ORDER — BUDESONIDE 3 MG PO CPEP: 9.0000 mg | ORAL_CAPSULE | Freq: Every day | ORAL | 2 refills | Status: DC

## 2021-05-31 MED ORDER — DICYCLOMINE HCL 10 MG PO CAPS: 10.0000 mg | ORAL_CAPSULE | Freq: Three times a day (TID) | ORAL | 2 refills | Status: DC

## 2021-05-31 NOTE — Progress Notes (Signed)
    History of Present Illness: This is a 45 year old female with lymphocytic colitis complaining of watery, nonbloody diarrhea and abdominal cramping.  Her diarrhea occurs multiple times daily and is urgent after meals.  Her symptoms responded to budesonide in 2021 and budesonide was discontinued in June 2021.  Dicyclomine was effective for cramping and lower abdominal pain.  She has persistent problems with abnormal uterine bleeding and iron deficiency anemia followed by Dr. Jana Hakim.  Current Medications, Allergies, Past Medical History, Past Surgical History, Family History and Social History were reviewed in [employer] record.   Physical Exam: General: Well developed, well nourished, no acute distress Head: Normocephalic and atraumatic Eyes: Sclerae anicteric, EOMI Ears: Normal auditory acuity Mouth: Not examined, mask on during Covid-19 pandemic Lungs: Clear throughout to auscultation Heart: Regular rate and rhythm; no murmurs, rubs or bruits Abdomen: Soft, non tender and non distended. No masses, hepatosplenomegaly or hernias noted. Normal Bowel sounds Rectal: Not done Musculoskeletal: Symmetrical with no gross deformities  Pulses:  Normal pulses noted Extremities: No clubbing, cyanosis, edema or deformities noted Neurological: Alert oriented x 4, grossly nonfocal Psychological:  Alert and cooperative. Normal mood and affect   Assessment and Recommendations:  Lymphocytic colitis with recurrent symptoms including watery, nonbloody diarrhea, fecal urgency, abdominal cramping.  Resume budesonide 9 mg daily for 3 months.  Resume dicyclomine 10 mg taken 30 minutes before meals and at bedtime.  Imodium 1-2 p.o. 3 times daily as needed for breakthrough diarrhea.  Budesonide may need to be used long-term if her diarrhea cannot be adequately controlled with Imodium and dicyclomine.  Avoid foods and beverages that exacerbate symptoms.  With medications her symptoms  should be well controlled as they were in 2021. REV in 2 months.  IDA with AUB.  Follow-up with Dr. Jana Hakim, GYN and PCP.

## 2021-05-31 NOTE — Patient Instructions (Signed)
We have sent the following medications to your pharmacy for you to pick up at your convenience: Budesonide and dicyclomine.   Call if you do run out of the prescriptions or if your symptoms are not better.  You can also take an over the counter Imodium up to three times a day if diarrhea is worse.   Please follow up in 2 months with Dr. Fuller Plan .  Due to recent changes in healthcare laws, you may see the results of your imaging and laboratory studies on MyChart before your provider has had a chance to review them.  We understand that in some cases there may be results that are confusing or concerning to you. Not all laboratory results come back in the same time frame and the provider may be waiting for multiple results in order to interpret others.  Please give [place] 48 hours in order for your provider to thoroughly review all the results before contacting the office for clarification of your results.   The [facility] providers would like to encourage you to use [facility] to communicate with providers for non-urgent requests or questions.  Due to long hold times on the telephone, sending your provider a message by [facility] may be a faster and more efficient way to get a response.  Please allow 48 business hours for a response.  Please remember that this is for non-urgent requests.   Normal BMI (Body Mass Index- based on height and weight) is between 19 and 25. Your BMI today is Body mass index is 36.33 kg/m. [name] Please consider follow up  regarding your BMI with your Primary Care Provider.  Thank you for choosing me and [facility].  Pricilla Riffle. Dagoberto Ligas., MD., Marval Regal

## 2021-06-01 ENCOUNTER — Other Ambulatory Visit: Payer: Self-pay | Admitting: Oncology

## 2021-06-02 ENCOUNTER — Other Ambulatory Visit: Payer: Self-pay | Admitting: *Deleted

## 2021-06-05 ENCOUNTER — Other Ambulatory Visit: Payer: Self-pay

## 2021-06-05 ENCOUNTER — Inpatient Hospital Stay: Payer: BC Managed Care – PPO

## 2021-06-05 ENCOUNTER — Other Ambulatory Visit: Payer: Self-pay | Admitting: Oncology

## 2021-06-05 VITALS — BP 147/98 | HR 86 | Temp 98.4°F | Resp 16

## 2021-06-05 DIAGNOSIS — Z79899 Other long term (current) drug therapy: Secondary | ICD-10-CM | POA: Diagnosis not present

## 2021-06-05 DIAGNOSIS — D509 Iron deficiency anemia, unspecified: Secondary | ICD-10-CM | POA: Diagnosis not present

## 2021-06-05 DIAGNOSIS — N920 Excessive and frequent menstruation with regular cycle: Secondary | ICD-10-CM | POA: Diagnosis not present

## 2021-06-05 DIAGNOSIS — M479 Spondylosis, unspecified: Secondary | ICD-10-CM | POA: Diagnosis not present

## 2021-06-05 DIAGNOSIS — N921 Excessive and frequent menstruation with irregular cycle: Secondary | ICD-10-CM

## 2021-06-05 DIAGNOSIS — K52832 Lymphocytic colitis: Secondary | ICD-10-CM | POA: Diagnosis not present

## 2021-06-05 DIAGNOSIS — Z79818 Long term (current) use of other agents affecting estrogen receptors and estrogen levels: Secondary | ICD-10-CM | POA: Diagnosis not present

## 2021-06-05 MED ORDER — SODIUM CHLORIDE 0.9 % IV SOLN: Freq: Once | INTRAVENOUS | Status: AC

## 2021-06-05 MED ORDER — DIPHENHYDRAMINE HCL 50 MG/ML IJ SOLN
25.0000 mg | Freq: Once | INTRAMUSCULAR | Status: AC
  Administered 2021-06-05: 25 mg via INTRAVENOUS

## 2021-06-05 MED ORDER — METHYLPREDNISOLONE SODIUM SUCC 125 MG IJ SOLR: 62.5000 mg | Freq: Once | INTRAMUSCULAR | Status: AC

## 2021-06-05 MED ORDER — DIPHENHYDRAMINE HCL 50 MG/ML IJ SOLN
INTRAMUSCULAR | Status: AC
  Filled 2021-06-05: qty 1

## 2021-06-05 MED ORDER — METHYLPREDNISOLONE SODIUM SUCC 125 MG IJ SOLR
INTRAMUSCULAR | Status: AC
  Administered 2021-06-05: 62.5 mg via INTRAVENOUS
  Filled 2021-06-05: qty 2

## 2021-06-05 MED ORDER — IRON SUCROSE 20 MG/ML IV SOLN
400.0000 mg | Freq: Once | INTRAVENOUS | Status: AC
  Administered 2021-06-05: 400 mg via INTRAVENOUS
  Filled 2021-06-05: qty 20

## 2021-06-05 MED ORDER — LEUPROLIDE ACETATE 3.75 MG IM KIT
3.7500 mg | PACK | Freq: Once | INTRAMUSCULAR | Status: AC
  Administered 2021-06-05: 3.75 mg via INTRAMUSCULAR
  Filled 2021-06-05: qty 3.75

## 2021-06-05 NOTE — Progress Notes (Signed)
Erika David is in for iron infusion today.  I did not see a clear statement in my most recent note regarding Lupron.  She did receive it before.  It did work and she tolerated it well but for insurance reasons it was discontinued so her periods resumed.  She is eager to resume it and so she will start that again today.

## 2021-06-05 NOTE — Patient Instructions (Signed)

## 2021-06-05 NOTE — Progress Notes (Signed)
At the end of patient's observation period, patient began c/o itching and urticaria to left forearm. Dr. Jana Hakim notified. Orders received and patient medicated as documented in [facility]. Patient's symptoms continued to progress with urticaria and itching noted to right forearm. Additional medications given as documented in [facility]. Patient verbalized near-immediate relief of symptoms and requested to be discharged. No additional issues noted and patient discharged in stable condition.

## 2021-06-06 ENCOUNTER — Ambulatory Visit
Admission: RE | Admit: 2021-06-06 | Discharge: 2021-06-06 | Disposition: A | Discharge status: Home / Self Care | Payer: BC Managed Care – PPO | Source: Ambulatory Visit

## 2021-06-06 DIAGNOSIS — Z1231 Encounter for screening mammogram for malignant neoplasm of breast: Secondary | ICD-10-CM

## 2021-06-07 ENCOUNTER — Other Ambulatory Visit: Payer: Self-pay | Admitting: Oncology

## 2021-06-08 ENCOUNTER — Ambulatory Visit (INDEPENDENT_AMBULATORY_CARE_PROVIDER_SITE_OTHER): Payer: BC Managed Care – PPO | Admitting: Sports Medicine

## 2021-06-08 ENCOUNTER — Telehealth: Payer: Self-pay | Admitting: Oncology

## 2021-06-08 ENCOUNTER — Ambulatory Visit (INDEPENDENT_AMBULATORY_CARE_PROVIDER_SITE_OTHER): Payer: BC Managed Care – PPO

## 2021-06-08 ENCOUNTER — Other Ambulatory Visit: Payer: Self-pay | Admitting: Sports Medicine

## 2021-06-08 ENCOUNTER — Encounter: Payer: Self-pay | Admitting: Sports Medicine

## 2021-06-08 ENCOUNTER — Other Ambulatory Visit: Payer: Self-pay

## 2021-06-08 DIAGNOSIS — M722 Plantar fascial fibromatosis: Secondary | ICD-10-CM

## 2021-06-08 DIAGNOSIS — M79672 Pain in left foot: Secondary | ICD-10-CM

## 2021-06-08 DIAGNOSIS — M2141 Flat foot [pes planus] (acquired), right foot: Secondary | ICD-10-CM | POA: Diagnosis not present

## 2021-06-08 DIAGNOSIS — M2142 Flat foot [pes planus] (acquired), left foot: Secondary | ICD-10-CM

## 2021-06-08 DIAGNOSIS — M79671 Pain in right foot: Secondary | ICD-10-CM

## 2021-06-08 MED ORDER — TRIAMCINOLONE ACETONIDE 10 MG/ML IJ SUSP
10.0000 mg | Freq: Once | INTRAMUSCULAR | Status: AC
  Administered 2021-06-08: 10 mg

## 2021-06-08 NOTE — Progress Notes (Signed)
Subjective: Erika David is a 45 y.o. female patient presents to office with complaint of moderate heel pain on the left and right. 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 Patient has treated this problem with changing shoes but states that she had a history of Planter fasciitis years ago that went away after she got injections but was concerned because the same pain has come back again and she was not sure if it was properly treated from before.  Patient denies any other pedal complaints.   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 6.7 g 0   budesonide (ENTOCORT EC) 3 MG 24 hr capsule Take 3 capsules (9 mg total) by mouth daily. 90 capsule 2   Cetirizine HCl (ZYRTEC PO) Take by mouth.     cyclobenzaprine (FLEXERIL) 5 MG tablet Take 1-2 tablets (5-10 mg total) by mouth at bedtime. 30 tablet 0   dicyclomine (BENTYL) 10 MG capsule Take 1 capsule (10 mg total) by mouth 4 (four) times daily -  before meals and at bedtime. 120 capsule 2   Vitamin D, Ergocalciferol, (DRISDOL) 1.25 MG (50000 UNIT) CAPS capsule Take 1 capsule (50,000 Units total) by mouth every 7 (seven) days. 12 capsule 0   No current facility-administered  medications on file prior to visit.    Allergies  Allergen Reactions   Bee Pollen Anaphylaxis    Tree pollen in environment Tree pollen in environment   Dust Mite Extract Anaphylaxis   Kiwi Extract Itching and Swelling   Pollen Extract Anaphylaxis    Tree pollen in environment   Other Rash   Soy Allergy Other (See Comments)    Per allergy testing Per allergy testing   Venofer [Iron Sucrose] Hives and Itching    Patient developed hives and itching at the end of 30 minute observation period. Dr. Jana Hakim aware and will add premedications to future iron infusions.     Objective: Physical Exam General: The patient is alert and oriented x3 in no acute distress.  Dermatology: Skin is warm, dry and supple bilateral lower extremities. Nails 1-10 are normal. There is no erythema, edema, no eccymosis, no open lesions present. Integument is otherwise unremarkable.  Vascular: Dorsalis Pedis pulse and Posterior Tibial pulse are 2/4 bilateral. Capillary fill time is immediate to all digits.  Neurological: Grossly intact to light touch bilateral.  Musculoskeletal: Tenderness to palpation at the medial calcaneal tubercale and through the insertion of the plantar fascia on the left and right foot. No pain with compression of calcaneus bilateral. No pain with calf compression bilateral. There is decreased Ankle joint range of motion bilateral.  Pes planus foot type.  All other joints range of motion within normal limits bilateral. Strength  5/5 in all groups bilateral.   Gait: Unassisted, Antalgic avoid weight on heels  Xray, Right/Left foot:  Normal osseous mineralization. Joint spaces preserved. No fracture/dislocation/boney destruction. Calcaneal spur present with mild thickening of plantar fascia. No other soft tissue abnormalities or radiopaque foreign bodies.   Assessment and Plan: Problem List Items Addressed This Visit   None Visit Diagnoses     Plantar fasciitis, bilateral    -   Primary   Relevant Medications   triamcinolone acetonide (KENALOG) 10 MG/ML injection 10 mg (Completed) (Start on 06/08/2021  7:15 PM)   Pain in both feet       Pes planus of both feet          -Complete examination performed.  -Xrays reviewed -Discussed with patient in detail the condition of plantar fasciitis, how this occurs and general treatment options. Explained both conservative and surgical treatments.  -After oral consent and aseptic prep, injected a mixture containing 1 ml of 2%  plain lidocaine, 1 ml 0.5% plain marcaine, 0.5 ml of kenalog 10 and 0.5 ml of dexamethasone phosphate into left and right heel.  Post-injection care discussed with patient. -Recommended good supportive shoes and refrain from walking barefoot - Explained in detail the use of the night splint (1) which was dispensed at today's visit. -Explained and dispensed to patient daily stretching exercises. -Recommend patient to ice affected area 1-2x daily. -Patient to return to office in 4 weeks for follow up or sooner if problems or questions arise.  Landis Martins, DPM

## 2021-06-08 NOTE — Telephone Encounter (Signed)
Scheduled appointment per melanie. Left message with patient, with new appointment times.

## 2021-06-09 LAB — HM MAMMOGRAPHY

## 2021-06-11 ENCOUNTER — Other Ambulatory Visit: Payer: Self-pay | Admitting: Nurse Practitioner

## 2021-06-11 DIAGNOSIS — G8929 Other chronic pain: Secondary | ICD-10-CM

## 2021-06-11 DIAGNOSIS — M545 Low back pain, unspecified: Secondary | ICD-10-CM

## 2021-06-12 ENCOUNTER — Ambulatory Visit: Payer: BC Managed Care – PPO

## 2021-06-12 ENCOUNTER — Other Ambulatory Visit: Payer: Self-pay

## 2021-06-12 ENCOUNTER — Inpatient Hospital Stay: Payer: BC Managed Care – PPO

## 2021-06-14 ENCOUNTER — Telehealth: Payer: Self-pay | Admitting: Oncology

## 2021-06-14 NOTE — Telephone Encounter (Signed)
Sch per 10/1 pt request, left msg

## 2021-06-19 ENCOUNTER — Other Ambulatory Visit: Payer: Self-pay

## 2021-06-19 ENCOUNTER — Inpatient Hospital Stay: Payer: BC Managed Care – PPO | Attending: Hematology & Oncology

## 2021-06-19 VITALS — BP 137/83 | HR 86 | Temp 98.2°F | Resp 17

## 2021-06-19 DIAGNOSIS — Z79899 Other long term (current) drug therapy: Secondary | ICD-10-CM | POA: Insufficient documentation

## 2021-06-19 DIAGNOSIS — D509 Iron deficiency anemia, unspecified: Secondary | ICD-10-CM | POA: Diagnosis not present

## 2021-06-19 DIAGNOSIS — N921 Excessive and frequent menstruation with irregular cycle: Secondary | ICD-10-CM

## 2021-06-19 MED ORDER — IRON SUCROSE 20 MG/ML IV SOLN
400.0000 mg | Freq: Once | INTRAVENOUS | Status: AC
  Administered 2021-06-19: 400 mg via INTRAVENOUS
  Filled 2021-06-19: qty 20

## 2021-06-19 MED ORDER — METHYLPREDNISOLONE SODIUM SUCC 125 MG IJ SOLR: 125.0000 mg | Freq: Once | INTRAMUSCULAR | Status: DC | PRN

## 2021-06-19 MED ORDER — DIPHENHYDRAMINE HCL 50 MG/ML IJ SOLN: 25.0000 mg | Freq: Once | INTRAMUSCULAR | Status: DC | PRN

## 2021-06-19 MED ORDER — SODIUM CHLORIDE 0.9 % IV SOLN: Freq: Once | INTRAVENOUS | Status: DC | PRN

## 2021-06-19 NOTE — Patient Instructions (Signed)

## 2021-06-26 ENCOUNTER — Inpatient Hospital Stay: Payer: BC Managed Care – PPO

## 2021-06-27 ENCOUNTER — Encounter: Payer: Self-pay | Admitting: Oncology

## 2021-06-29 ENCOUNTER — Other Ambulatory Visit: Payer: Self-pay

## 2021-06-29 ENCOUNTER — Encounter: Payer: Self-pay | Admitting: Sports Medicine

## 2021-06-29 ENCOUNTER — Ambulatory Visit (INDEPENDENT_AMBULATORY_CARE_PROVIDER_SITE_OTHER): Payer: BC Managed Care – PPO | Admitting: Sports Medicine

## 2021-06-29 DIAGNOSIS — M79672 Pain in left foot: Secondary | ICD-10-CM

## 2021-06-29 DIAGNOSIS — M722 Plantar fascial fibromatosis: Secondary | ICD-10-CM | POA: Diagnosis not present

## 2021-06-29 DIAGNOSIS — M2142 Flat foot [pes planus] (acquired), left foot: Secondary | ICD-10-CM

## 2021-06-29 DIAGNOSIS — M79671 Pain in right foot: Secondary | ICD-10-CM

## 2021-06-29 DIAGNOSIS — M2141 Flat foot [pes planus] (acquired), right foot: Secondary | ICD-10-CM

## 2021-06-29 MED ORDER — TRIAMCINOLONE ACETONIDE 10 MG/ML IJ SUSP
10.0000 mg | Freq: Once | INTRAMUSCULAR | Status: AC
  Administered 2021-06-29: 10 mg

## 2021-06-29 NOTE — Progress Notes (Signed)
Subjective: Erika David is a 45 y.o. female returns to office for follow up evaluation after Left and Right heel injection for plantar fasciitis, injection #1 administered 3 weeks ago. Patient states that the injection seems to help her pain.  Feels 65% better.  States that the night splint icing and stretching has also been very helpful as well.  Patient reports less pain states that she can stand more at work with less pain however still has some discomfort towards the end of her shift/day especially if she has been doing a lot of standing or walking.  States that on Monday and Tuesday she usually works 2 jobs and on those days had some pain.  Patient denies any other pedal complaints at this time.  Patient Active Problem List   Diagnosis Date Noted   Vitamin D deficiency 05/22/2021   Chronic back pain 01/30/2021   S/P tubal ligation 01/30/2021   Abnormal CT scan, colon 08/11/2020   Menorrhagia 02/15/2020   Iron deficiency anemia due to chronic blood loss 11/30/2019   Lymphocytic colitis 11/13/2019   Generalized weakness 11/09/2019   Pancolitis ([place]) 11/08/2019   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/2018   Perennial allergic rhinitis 10/17/2018   Genital herpes simplex 01/31/2017   S/P cesarean section 04/12/2012   Obesity 01/04/2012   Anemia 01/04/2012    Current Outpatient Medications on File Prior to Visit  Medication Sig Dispense Refill   albuterol (VENTOLIN HFA) 108 (90 Base) MCG/ACT inhaler Inhale 2 puffs into the lungs every 6 (six) hours as needed for wheezing or shortness of breath. 6.7 g 0   budesonide (ENTOCORT EC) 3 MG 24 hr capsule Take 3 capsules (9 mg total) by mouth daily. 90 capsule 2   Cetirizine HCl (ZYRTEC PO) Take by mouth.     cyclobenzaprine (FLEXERIL) 5 MG tablet Take 1-2 tablets (5-10 mg total) by mouth at bedtime. 30 tablet 0   dicyclomine (BENTYL) 10 MG capsule Take 1 capsule (10 mg total) by mouth 4 (four) times daily -  before meals and at bedtime. 120 capsule 2    Vitamin D, Ergocalciferol, (DRISDOL) 1.25 MG (50000 UNIT) CAPS capsule Take 1 capsule (50,000 Units total) by mouth every 7 (seven) days. 12 capsule 0   No current facility-administered medications on file prior to visit.    Allergies  Allergen Reactions   Bee Pollen Anaphylaxis    Tree pollen in environment Tree pollen in environment   Dust Mite Extract Anaphylaxis   Kiwi Extract Itching and Swelling   Pollen Extract Anaphylaxis    Tree pollen in environment   Other Rash   Soy Allergy Other (See Comments)    Per allergy testing Per allergy testing   Venofer [Iron Sucrose] Hives and Itching    Patient developed hives and itching at the end of 30 minute observation period. Dr. Jana Hakim aware and will add premedications to future iron infusions.     Objective:   General:  Alert and oriented x 3, in no acute distress  Dermatology: Skin is warm, dry, and supple bilateral. Nails are within normal limits. There is no lower extremity erythema, no eccymosis, no open lesions present bilateral.   Vascular: Dorsalis Pedis and Posterior Tibial pedal pulses are 2/4 bilateral. + hair growth noted bilateral. Capillary Fill Time is 3 seconds in all digits. No varicosities, No edema bilateral lower extremities.   Neurological: Sensation grossly intact to light touch bilateral.  Musculoskeletal: There is decreased tenderness to palpation at the medial calcaneal tubercale and through  the insertion of the plantar fascia on the right> left foot. No pain with compression to calcaneus or application of tuning fork. There is decreased Ankle joint range of motion bilateral.  Pes planus foot type.  All other jointsrange of motion  within normal limits bilateral. Strength 5/5 bilateral.   Assessment and Plan: Problem List Items Addressed This Visit   None Visit Diagnoses     Plantar fasciitis, bilateral    -  Primary   Pain in both feet       Pes planus of both feet           -Complete examination  performed.  -Previous x-rays reviewed. -Discussed with patient in detail the condition of plantar fasciitis, how this  occurs related to the foot type of the patient and general treatment options. - Patient opted for another injection today; After oral consent and aseptic prep, injected a mixture containing 1 ml of 1%plain lidocaine, 1 ml 0.5% plain marcaine, 0.5 ml of kenalog 10 and 0.5 ml of dexmethasone phosphate to left and right heel at area of most pain/trigger point injection. -Advised patient to continue with daily stretching -Advised patient to continue with night splint -Continue with icing -Continue with good supportive shoes daily -Discussed long term care and reocurrence; will closely monitor; if fails to improve will consider other treatment modalities.  -Patient to return to office i if symptoms fail to continue to improve or sooner if problems or questions arise.  Landis Martins, DPM

## 2021-07-05 DIAGNOSIS — Z130 Encounter for screening for diseases of the blood and blood-forming organs and certain disorders involving the immune mechanism: Secondary | ICD-10-CM | POA: Diagnosis not present

## 2021-07-05 DIAGNOSIS — N898 Other specified noninflammatory disorders of vagina: Secondary | ICD-10-CM | POA: Diagnosis not present

## 2021-07-05 DIAGNOSIS — Z1151 Encounter for screening for human papillomavirus (HPV): Secondary | ICD-10-CM | POA: Diagnosis not present

## 2021-07-05 DIAGNOSIS — Z124 Encounter for screening for malignant neoplasm of cervix: Secondary | ICD-10-CM | POA: Diagnosis not present

## 2021-07-05 DIAGNOSIS — N920 Excessive and frequent menstruation with regular cycle: Secondary | ICD-10-CM | POA: Diagnosis not present

## 2021-07-05 DIAGNOSIS — Z01419 Encounter for gynecological examination (general) (routine) without abnormal findings: Secondary | ICD-10-CM | POA: Diagnosis not present

## 2021-07-05 LAB — HM PAP SMEAR

## 2021-07-24 ENCOUNTER — Inpatient Hospital Stay: Payer: BC Managed Care – PPO | Attending: Hematology & Oncology

## 2021-07-31 ENCOUNTER — Ambulatory Visit: Payer: BC Managed Care – PPO | Admitting: Gastroenterology

## 2021-08-10 ENCOUNTER — Other Ambulatory Visit: Payer: Self-pay | Admitting: Nurse Practitioner

## 2021-08-10 DIAGNOSIS — E559 Vitamin D deficiency, unspecified: Secondary | ICD-10-CM

## 2021-08-20 NOTE — Progress Notes (Incomplete)
[facility]  [phone] [phone]     ID: Erika David DOB: 03-31-1976  MR#: [id number]  GEX#:[id number]  Patient Care Team: Flossie Buffy, NP as PCP - General (Internal Medicine) Magrinat, [name], MD as Consulting Physician (Oncology) Aurea Graff OTHER MD:  CHIEF COMPLAINT: iron deficiency  CURRENT TREATMENT: Leuprolide   INTERVAL HISTORY: Erika David returns today for follow up of her iron deficiency.   She received two doses of Feraheme in 9/26 and 06/19/2021 and did well with those infusions, with no reactions.     Component Value Date/Time   IRON 35 (L) 05/22/2021 0927   TIBC 443 05/22/2021 0927   FERRITIN 5 (L) 05/22/2021 0927   IRONPCTSAT 8 (L) 05/22/2021 0927     Lab Results  Component Value Date   HGB 10.8 (L) 05/22/2021   HGB 12.7 04/19/2020   HGB 11.9 (L) 02/15/2020   HGB 12.4 02/09/2020   HGB 12.5 01/31/2020   Of note since her last visit, she underwent bilateral screening mammography with tomography at [facility] on 06/06/2021 showing: breast density category C; no evidence of malignancy in either breast.    REVIEW OF SYSTEMS: Erika David    COVID 19 VACCINATION STATUS: Status post [place] x2   HISTORY OF CURRENT ILLNESS: From the original intake note:  "Erika David" was admitted to the hospital from 11/08/2019 through 11/15/2019.  She was found to have lymphocytic colitis and underwent colonoscopy with Dr. Fuller Plan on 3/4 and was started on Budesonide, Entocort, and Loperamide PRN on 11/13/2019.  Her chronic anemia was noted to be related to menorrhagia and she received one dose of IV iron on 11/11/2019.  Since receiving that IV iron, her hemoglobin improved from 9.7 to 10.8.  Her anemia dates back to 2011 when her hemoglobin was as low as 10.2.  She did have anemia associated with her fourth and fifth pregnancies, however after her fifth pregnancy her mild anemia remained and didn't rebound.  We have two ferritin levels on  record, with the first one being on 11/09/2019 which was 8.    Results for Erika, David (MRN [id number]) as of 12/02/2019 14:17  Ref. Range 03/17/2009 16:27 04/17/2009 23:31 11/04/2009 05:10 10/16/2011 15:28 11/20/2011 09:08 01/21/2012 11:46 03/12/2012 00:30 04/12/2012 05:35 05/29/2013 14:12 05/11/2016 16:46 06/18/2019 19:01 11/05/2019 16:07 12/02/2019 13:33  Hemoglobin Latest Ref Range: 12.0 - 15.0 g/dL 13.0 12.1 10.2 (L) 12.7 11.8 (L) 10.8 (L) 11.2 (L) 9.9 (L) 12.7 10.4 (L) 9.3 (L) 9.7 (L) 10.8 (L)    She has not yet been evaluated by gynecology.  Erika David notes that she has been having heavier cycles since having her tubal ligation.  She notes it is 7 days in length, sometimes longer.  Her pad count is about 3-5 per day for about 4-7 days.  Then it will lighten and taper off.    The patient's subsequent history is as detailed below.   PAST MEDICAL HISTORY: Past Medical History:  Diagnosis Date   Abnormal Pap smear     LEEP?  LAST PAP 05/2009   Acute respiratory failure due to COVID-19 [facility]) 01/27/2020   Anemia 01/04/2012   Chlamydia    H/O candidiasis    H/O dysmenorrhea 03/13/02   H/O gonorrhea    treated   H/O menorrhagia 07/09/02   Infection 2006   GONORHRREA   Infection 2006   CHLAMYDIA   Infection    FREQ YEAST   Pelvic injury AS TEEN   SPORTS RELATED  Pelvic pain 07/11/10   Sterilization 01/04/2012   [facility] PP BTL Tubal papers signed    Trichomonas    Yeast infection     PAST SURGICAL HISTORY: Past Surgical History:  Procedure Laterality Date   BIOPSY  11/12/2019   Procedure: BIOPSY;  Surgeon: Ladene Artist, MD;  Location: [facility];  Service: Endoscopy;;   CESAREAN SECTION  04/11/2012   Procedure: CESAREAN SECTION;  Surgeon: Delice Lesch, MD;  Location: [facility];  Service: Gynecology;  Laterality: N/A;  twins   COLONOSCOPY WITH PROPOFOL N/A 11/12/2019   Procedure: COLONOSCOPY WITH PROPOFOL;  Surgeon: Ladene Artist, MD;  Location: [facility];  Service: Endoscopy;  Laterality: N/A;    GYNECOLOGIC CRYOSURGERY     TUBAL LIGATION     WISDOM TOOTH EXTRACTION  2007    FAMILY HISTORY Family History  Problem Relation Age of Onset   Hypertension Mother    Cancer Mother 71       BREAST   Thyroid disease Mother    Diabetes Father    Hypertension Sister    Liver disease Sister    Hypertension Maternal Grandmother    Diabetes Maternal Grandmother    Heart disease Maternal Grandfather    Stroke Maternal Grandfather    Lupus Maternal Aunt    Alcohol abuse Maternal Aunt    Drug abuse Maternal Aunt    Asthma Cousin    Anesthesia problems Neg Hx     GYNECOLOGIC HISTORY:  No LMP recorded. (Menstrual status: Irregular Periods). Menarche: 45 years old Age at first live birth: 45 years old [place] P 5 LMP 11/20/2019 Contraceptive tubal ligation HRT/OCP: none  Hysterectomy? no Salpingo-oophorectomy?no   SOCIAL HISTORY: (Updated April 2021) Erika David is "common law married" to her significant other [employer].  She lives with him in [place], [place].  Her children are: daughter 30, daughter 46, son 40, twin boy and girl who are 7.  All of her children live with her at home.  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  Erika David enjoys reading.      ADVANCED DIRECTIVES: The patient tells me she has name her daughter Erika David as her healthcare power of attorney   HEALTH MAINTENANCE: Social History   Tobacco Use   Smoking status: Never   Smokeless tobacco: Never  Vaping Use   Vaping Use: Never used  Substance Use Topics   Alcohol use: Yes    Comment: occasional alcohol   Drug use: No     Colonoscopy:11/12/2019  PAP: 2019  Bone density: not indicated   Allergies  Allergen Reactions   Bee Pollen Anaphylaxis    Tree pollen in environment Tree pollen in environment   Dust Mite Extract Anaphylaxis   Kiwi Extract Itching and Swelling   Pollen Extract Anaphylaxis    Tree pollen in environment   Other Rash   Soy Allergy Other (See Comments)    Per  allergy testing Per allergy testing   Venofer [Iron Sucrose] Hives and Itching    Patient developed hives and itching at the end of 30 minute observation period. Dr. Jana Hakim aware and will add premedications to future iron infusions.     Current Outpatient Medications  Medication Sig Dispense Refill   albuterol (VENTOLIN HFA) 108 (90 Base) MCG/ACT inhaler Inhale 2 puffs into the lungs every 6 (six) hours as needed for wheezing or shortness of breath. 6.7 g 0   budesonide (ENTOCORT EC) 3 MG 24 hr capsule Take 3  capsules (9 mg total) by mouth daily. 90 capsule 2   Cetirizine HCl (ZYRTEC PO) Take by mouth.     cyclobenzaprine (FLEXERIL) 5 MG tablet Take 1-2 tablets (5-10 mg total) by mouth at bedtime. 30 tablet 0   dicyclomine (BENTYL) 10 MG capsule Take 1 capsule (10 mg total) by mouth 4 (four) times daily -  before meals and at bedtime. 120 capsule 2   Vitamin D, Ergocalciferol, (DRISDOL) 1.25 MG (50000 UNIT) CAPS capsule TAKE 1 CAPSULE (50,000 UNITS TOTAL) BY MOUTH EVERY 7 (SEVEN) DAYS 12 capsule 0   No current facility-administered medications for this visit.    OBJECTIVE: African-American woman who appears stated age  There were no vitals filed for this visit.     There is no height or weight on file to calculate BMI.   Wt Readings from Last 3 Encounters:  05/31/21 192 lb 4 oz (87.2 kg)  05/30/21 193 lb 3.2 oz (87.6 kg)  05/22/21 191 lb 9.6 oz (86.9 kg)     ECOG FS:1 - Symptomatic but completely ambulatory  Sclerae unicteric, EOMs intact Wearing a mask No cervical or supraclavicular adenopathy Lungs no rales or rhonchi Heart regular rate and rhythm Abd soft, nontender, positive bowel sounds MSK no focal spinal tenderness, no upper extremity lymphedema Neuro: nonfocal, well oriented, appropriate affect Breasts:    {Sclerae unicteric, EOMs intact Wearing a mask No cervical or supraclavicular adenopathy Lungs no rales or rhonchi Heart regular rate and rhythm Abd soft,  nontender, positive bowel sounds MSK no focal spinal tenderness, no upper extremity lymphedema Neuro: nonfocal, well oriented, appropriate affect Breasts: No masses in either breast, no skin or nipple changes of concern.}   LAB RESULTS:  CMP     Component Value Date/Time   NA 140 02/09/2020 1516   K 3.8 02/09/2020 1516   CL 107 02/09/2020 1516   CO2 28 02/09/2020 1516   GLUCOSE 132 (H) 02/09/2020 1516   BUN 11 02/09/2020 1516   CREATININE 0.90 02/09/2020 1516   CREATININE 0.86 12/02/2019 1333   CREATININE 0.89 05/29/2013 1403   CALCIUM 9.1 02/09/2020 1516   PROT 6.2 (L) 01/31/2020 0328   ALBUMIN 3.2 (L) 01/31/2020 0328   AST 20 01/31/2020 0328   AST 10 (L) 12/02/2019 1333   ALT 32 01/31/2020 0328   ALT 11 12/02/2019 1333   ALKPHOS 57 01/31/2020 0328   BILITOT 0.6 01/31/2020 0328   BILITOT 0.2 (L) 12/02/2019 1333   GFRNONAA >60 01/31/2020 0328   GFRNONAA >60 12/02/2019 1333   GFRAA >60 01/31/2020 0328   GFRAA >60 12/02/2019 1333    No results found for: TOTALPROTELP, ALBUMINELP, A1GS, A2GS, BETS, BETA2SER, GAMS, MSPIKE, SPEI  No results found for: Nils Pyle, [facility]  Lab Results  Component Value Date   WBC 4.5 05/22/2021   NEUTROABS 2.5 05/22/2021   HGB 10.8 (L) 05/22/2021   HCT 34.4 (L) 05/22/2021   MCV 76.1 (L) 05/22/2021   PLT 263.0 05/22/2021      Chemistry      Component Value Date/Time   NA 140 02/09/2020 1516   K 3.8 02/09/2020 1516   CL 107 02/09/2020 1516   CO2 28 02/09/2020 1516   BUN 11 02/09/2020 1516   CREATININE 0.90 02/09/2020 1516   CREATININE 0.86 12/02/2019 1333   CREATININE 0.89 05/29/2013 1403      Component Value Date/Time   CALCIUM 9.1 02/09/2020 1516   ALKPHOS 57 01/31/2020 0328   AST 20 01/31/2020 0328   AST 10 (  L) 12/02/2019 1333   ALT 32 01/31/2020 0328   ALT 11 12/02/2019 1333   BILITOT 0.6 01/31/2020 0328   BILITOT 0.2 (L) 12/02/2019 1333       No results found for: LABCA2  No components found  for: ONGEXB284  No results for input(s): INR in the last 168 hours.  No results found for: LABCA2  No results found for: XLK440  No results found for: NUU725  No results found for: DGU440  No results found for: CA2729  No components found for: HGQUANT  No results found for: CEA1 / No results found for: CEA1   No results found for: AFPTUMOR  No results found for: CHROMOGRNA  No results found for: HGBA, HGBA2QUANT, HGBFQUANT, HGBSQUAN (Hemoglobinopathy evaluation)   Lab Results  Component Value Date   LDH 259 (H) 01/27/2020    Lab Results  Component Value Date   IRON 35 (L) 05/22/2021   TIBC 443 05/22/2021   IRONPCTSAT 8 (L) 05/22/2021   (Iron and TIBC)  Lab Results  Component Value Date   FERRITIN 5 (L) 05/22/2021    Urinalysis    Component Value Date/Time   COLORURINE YELLOW 01/27/2020 1800   APPEARANCEUR HAZY (A) 01/27/2020 1800   LABSPEC 1.027 01/27/2020 1800   PHURINE 6.0 01/27/2020 1800   GLUCOSEU NEGATIVE 01/27/2020 1800   HGBUR NEGATIVE 01/27/2020 1800   BILIRUBINUR NEGATIVE 01/27/2020 1800   BILIRUBINUR neg 05/29/2013 1412   KETONESUR 5 (A) 01/27/2020 1800   PROTEINUR 30 (A) 01/27/2020 1800   UROBILINOGEN 0.2 05/29/2013 1412   UROBILINOGEN 0.2 04/08/2012 2330   NITRITE NEGATIVE 01/27/2020 1800   LEUKOCYTESUR MODERATE (A) 01/27/2020 1800    STUDIES: No results found.   ASSESSMENT: 45 y.o. [place] woman here today for evaluation regarding iron deficiency.    1. Microcytic anemia dating back to 2013, unable to tolerate oral iron  (a) No work-up until 11/09/2019, when ferritin was 8  (b) Received IV iron--Feraheme 533m on 11/11/2019 and 12/14/2019  2. Menorrhagia:  (a) starting leuprolide every 4 weeks, first dose 04/19/2020  2. Lymphocytic colitis managed by Dr. SFuller Plan (a) most recent colonoscopy 11/12/2019   PLAN: SOk Edwardsdid very well with her Feraheme infusions last year.  We had also planned to treat her with leuprolide but she tells  me this was not covered by her insurance so she continues to have problems with very heavy periods and she is now again iron deficient.  I have set her up for Feraheme next week and the week following.  She is also agreeable to start Lupron assuming we can get it covered.  She understands this will not damage her ovaries, but it will "put them to sleep" so after the second shot she will have no further menstrual periods until she discontinues the leuprolide for what ever reason she chooses to.  She will also not need to use contraceptives after the second leuprolide shot.  She understands the possible toxicities side effects and complications of leuprolide and is interested in proceeding.  Accordingly she will start both the Feraheme and leuprolide next week.  She will see me in December with her leuprolide dose and at that point likely we will start seeing her on a once a year basis until she chooses to discontinue the leuprolide.  Total encounter time 25 minutes.*Sarajane JewsC. Magrinat, MD 08/20/21 11:22 PM [facility] On[facility] [id number]. [phone]  Fax. [phone]   I, Wilburn Mylar, am acting as scribe for Dr. [name]. Magrinat.  I, Lurline Del MD, have reviewed the above documentation for accuracy and completeness, and I agree with the above.   *Total Encounter Time as defined by the Centers for Medicare and Medicaid Services includes, in addition to the face-to-face time of a patient visit (documented in the note above) non-face-to-face time: obtaining and reviewing outside history, ordering and reviewing medications, tests or procedures, care coordination (communications with other health care professionals or caregivers) and documentation in the medical record.

## 2021-08-21 ENCOUNTER — Inpatient Hospital Stay: Payer: BC Managed Care – PPO

## 2021-08-21 ENCOUNTER — Inpatient Hospital Stay: Payer: BC Managed Care – PPO | Attending: Hematology & Oncology

## 2021-08-21 ENCOUNTER — Inpatient Hospital Stay: Payer: BC Managed Care – PPO | Admitting: Oncology

## 2021-08-24 ENCOUNTER — Encounter: Payer: Self-pay | Admitting: Nurse Practitioner

## 2021-09-28 ENCOUNTER — Encounter: Payer: Self-pay | Admitting: Oncology

## 2021-10-16 ENCOUNTER — Ambulatory Visit (INDEPENDENT_AMBULATORY_CARE_PROVIDER_SITE_OTHER): Payer: BC Managed Care – PPO | Admitting: Nurse Practitioner

## 2021-10-16 ENCOUNTER — Encounter: Payer: Self-pay | Admitting: Nurse Practitioner

## 2021-10-16 ENCOUNTER — Other Ambulatory Visit: Payer: Self-pay

## 2021-10-16 VITALS — BP 130/84 | HR 86 | Temp 97.3°F | Wt 197.0 lb

## 2021-10-16 DIAGNOSIS — Z13220 Encounter for screening for lipoid disorders: Secondary | ICD-10-CM

## 2021-10-16 DIAGNOSIS — Z0001 Encounter for general adult medical examination with abnormal findings: Secondary | ICD-10-CM

## 2021-10-16 DIAGNOSIS — D500 Iron deficiency anemia secondary to blood loss (chronic): Secondary | ICD-10-CM | POA: Diagnosis not present

## 2021-10-16 DIAGNOSIS — K2100 Gastro-esophageal reflux disease with esophagitis, without bleeding: Secondary | ICD-10-CM

## 2021-10-16 DIAGNOSIS — E559 Vitamin D deficiency, unspecified: Secondary | ICD-10-CM

## 2021-10-16 DIAGNOSIS — M545 Low back pain, unspecified: Secondary | ICD-10-CM

## 2021-10-16 DIAGNOSIS — Z136 Encounter for screening for cardiovascular disorders: Secondary | ICD-10-CM | POA: Diagnosis not present

## 2021-10-16 DIAGNOSIS — G8929 Other chronic pain: Secondary | ICD-10-CM

## 2021-10-16 DIAGNOSIS — R739 Hyperglycemia, unspecified: Secondary | ICD-10-CM | POA: Diagnosis not present

## 2021-10-16 LAB — CBC
HCT: 40.1 % (ref 36.0–46.0)
Hemoglobin: 13.4 g/dL (ref 12.0–15.0)
MCHC: 33.4 g/dL (ref 30.0–36.0)
MCV: 87.2 fl (ref 78.0–100.0)
Platelets: 210 10*3/uL (ref 150.0–400.0)
RBC: 4.6 Mil/uL (ref 3.87–5.11)
RDW: 13.8 % (ref 11.5–15.5)
WBC: 5.9 10*3/uL (ref 4.0–10.5)

## 2021-10-16 LAB — COMPREHENSIVE METABOLIC PANEL
ALT: 10 U/L (ref 0–35)
AST: 11 U/L (ref 0–37)
Albumin: 4.4 g/dL (ref 3.5–5.2)
Alkaline Phosphatase: 94 U/L (ref 39–117)
BUN: 11 mg/dL (ref 6–23)
CO2: 28 mEq/L (ref 19–32)
Calcium: 9.6 mg/dL (ref 8.4–10.5)
Chloride: 105 mEq/L (ref 96–112)
Creatinine, Ser: 0.84 mg/dL (ref 0.40–1.20)
GFR: 84.76 mL/min (ref >60.00)
Glucose, Bld: 94 mg/dL (ref 70–99)
Potassium: 4 mEq/L (ref 3.5–5.1)
Sodium: 138 mEq/L (ref 135–145)
Total Bilirubin: 0.3 mg/dL (ref 0.2–1.2)
Total Protein: 7.4 g/dL (ref 6.0–8.3)

## 2021-10-16 LAB — HEMOGLOBIN A1C: Hgb A1c MFr Bld: 6 % (ref 4.6–6.5)

## 2021-10-16 LAB — LIPID PANEL
Cholesterol: 212 mg/dL — ABNORMAL HIGH (ref 0–200)
HDL: 51.3 mg/dL (ref >39.00)
LDL Cholesterol: 128 mg/dL — ABNORMAL HIGH (ref 0–99)
NonHDL: 161.18
Total CHOL/HDL Ratio: 4
Triglycerides: 165 mg/dL — ABNORMAL HIGH (ref 0.0–149.0)
VLDL: 33 mg/dL (ref 0.0–40.0)

## 2021-10-16 MED ORDER — OMEPRAZOLE 20 MG PO CPDR
20.0000 mg | DELAYED_RELEASE_CAPSULE | Freq: Two times a day (BID) | ORAL | 0 refills | Status: DC
Start: 2021-10-16 — End: 2023-05-08

## 2021-10-16 NOTE — Assessment & Plan Note (Signed)
Persistent She request referral to outpatient PT Advised about importance of weight loss and regular exercise.

## 2021-10-16 NOTE — Progress Notes (Signed)
Subjective:    Patient ID: Erika David, female    DOB: December 09, 1975, 46 y.o.   MRN: [id number]  Patient presents today for CPE and eval of chronic conditions  Gastroesophageal Reflux She complains of choking, coughing and heartburn. She reports no abdominal pain, no belching, no chest pain, no dysphagia, no early satiety, no globus sensation, no hoarse voice, no nausea, no sore throat, no stridor, no tooth decay, no water brash or no wheezing. This is a recurrent problem. The current episode started more than 1 month ago. The problem occurs constantly. The problem has been waxing and waning. The heartburn is located in the substernum. The heartburn is of moderate intensity. The heartburn does not wake her from sleep. The heartburn does not limit her activity. The heartburn doesn't change with position. The symptoms are aggravated by certain foods and medications. Pertinent negatives include no anemia, fatigue, melena, muscle weakness, orthopnea or weight loss. Risk factors include lack of exercise and obesity. She has tried nothing for the symptoms. Past procedures do not include an abdominal ultrasound, an EGD or H. pylori antibody titer. Past invasive treatments do not include gastroplasty, gastroplication or reflux surgery.  Iron deficiency anemia due to chronic blood loss Persistent menorrhagia No improvement with lupron injections Followed by [place], need to schedule f/up appt. Current iron infusion by hematology, last infusion 06/19/2021.  Repeat cbc and iron panel Schedule f/up with GYN  Chronic back pain Persistent She request referral to outpatient PT Advised about importance of weight loss and regular exercise.  Vitamin D deficiency Repeat vit. D  Vision: will schedule Dental:will schedule Diet:low fat, small meal portions Exercise:none due to work schedule Weight:  Wt Readings from Last 3 Encounters:  10/16/21 197 lb (89.4 kg)  05/31/21 192 lb 4 oz (87.2 kg)  05/30/21  193 lb 3.2 oz (87.6 kg)   Sexual History (orientation,birth control, marital status, STD):breast and pelvic exam completed by [employer]. Up to date qith pelvic and breast exam. No need for STD screen  Depression/Suicide: Depression screen [facility] 2/9 10/16/2021 01/22/2020 05/11/2016  Decreased Interest 0 0 0  Down, Depressed, Hopeless 1 0 0  PHQ - 2 Score 1 0 0  Altered sleeping 2 - -  Tired, decreased energy 2 - -  Change in appetite 1 - -  Feeling bad or failure about yourself  1 - -  Trouble concentrating 0 - -  Moving slowly or fidgety/restless 0 - -  Suicidal thoughts 0 - -  PHQ-9 Score 7 - -  Difficult doing work/chores Somewhat difficult - -   Immunizations: (TDAP, Hep C screen, Pneumovax, Influenza, zoster)  Health Maintenance  Topic Date Due   COVID-19 Vaccine (1) Never done   Flu Shot  12/08/2021*   Tetanus Vaccine  04/13/2022   Pap Smear  07/05/2024   Hepatitis C Screening: USPSTF Recommendation to screen - Ages 18-79 yo.  Completed   HIV Screening  Completed   HPV Vaccine  Aged Out  *Topic was postponed. The date shown is not the original due date.   Fall Risk: Fall Risk  01/22/2020 05/11/2016  Falls in the past year? 0 No  Number falls in past yr: 0 -  Injury with Fall? 0 -   Medications and allergies reviewed with patient and updated if appropriate.  Patient Active Problem List   Diagnosis Date Noted   Vitamin D deficiency 05/22/2021   Chronic back pain 01/30/2021   S/P tubal ligation 01/30/2021   Abnormal  CT scan, colon 08/11/2020   Menorrhagia 02/15/2020   Iron deficiency anemia due to chronic blood loss 11/30/2019   Lymphocytic colitis 11/13/2019   Generalized weakness 11/09/2019   Pancolitis ([place]) 11/08/2019   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/2018   Perennial allergic rhinitis 10/17/2018   Genital herpes simplex 01/31/2017   S/P cesarean section 04/12/2012   Obesity 01/04/2012   Anemia 01/04/2012    Current Outpatient Medications on File Prior to Visit   Medication Sig Dispense Refill   albuterol (VENTOLIN HFA) 108 (90 Base) MCG/ACT inhaler Inhale 2 puffs into the lungs every 6 (six) hours as needed for wheezing or shortness of breath. 6.7 g 0   Cetirizine HCl (ZYRTEC PO) Take by mouth.     dicyclomine (BENTYL) 10 MG capsule Take 1 capsule (10 mg total) by mouth 4 (four) times daily -  before meals and at bedtime. 120 capsule 2   Vitamin D, Ergocalciferol, (DRISDOL) 1.25 MG (50000 UNIT) CAPS capsule TAKE 1 CAPSULE (50,000 UNITS TOTAL) BY MOUTH EVERY 7 (SEVEN) DAYS (Patient not taking: Reported on 10/16/2021) 12 capsule 0   No current facility-administered medications on file prior to visit.    Past Medical History:  Diagnosis Date   Abnormal Pap smear     LEEP?  LAST PAP 05/2009   Acute respiratory failure due to COVID-19 ([place]) 01/27/2020   Anemia 01/04/2012   Chlamydia    H/O candidiasis    H/O dysmenorrhea 03/13/02   H/O gonorrhea    treated   H/O menorrhagia 07/09/02   Infection 2006   GONORHRREA   Infection 2006   CHLAMYDIA   Infection    FREQ YEAST   Pelvic injury AS TEEN   SPORTS RELATED   Pelvic pain 07/11/10   Sterilization 01/04/2012   [facility] BTL Tubal papers signed    Trichomonas    Yeast infection     Past Surgical History:  Procedure Laterality Date   BIOPSY  11/12/2019   Procedure: BIOPSY;  Surgeon: Ladene Artist, MD;  Location: [facility];  Service: Endoscopy;;   CESAREAN SECTION  04/11/2012   Procedure: CESAREAN SECTION;  Surgeon: Delice Lesch, MD;  Location: [facility];  Service: Gynecology;  Laterality: N/A;  twins   COLONOSCOPY WITH PROPOFOL N/A 11/12/2019   Procedure: COLONOSCOPY WITH PROPOFOL;  Surgeon: Ladene Artist, MD;  Location: [facility];  Service: Endoscopy;  Laterality: N/A;   GYNECOLOGIC CRYOSURGERY     TUBAL LIGATION     WISDOM TOOTH EXTRACTION  2007    Social History   Socioeconomic History   Marital status: Married    Spouse name: Not on file   Number of children: 3   Years of  education: 16   Highest education level: Not on file  Occupational History   Occupation: CNA  Tobacco Use   Smoking status: Never   Smokeless tobacco: Never  Vaping Use   Vaping Use: Never used  Substance and Sexual Activity   Alcohol use: Yes    Comment: occasional alcohol   Drug use: No   Sexual activity: Yes    Birth control/protection: Surgical    Comment: btl  Other Topics Concern   Not on file  Social History Narrative   Not on file   Social Determinants of Health   Financial Resource Strain: Not on file  Food Insecurity: Not on file  Transportation Needs: Not on file  Physical Activity: Not on file  Stress: Not on file  Social Connections: Not on file  Family History  Problem Relation Age of Onset   Hypertension Mother    Cancer Mother 29       BREAST   Thyroid disease Mother    Diabetes Father    Hypertension Sister    Liver disease Sister    Hypertension Maternal Grandmother    Diabetes Maternal Grandmother    Heart disease Maternal Grandfather    Stroke Maternal Grandfather    Lupus Maternal Aunt    Alcohol abuse Maternal Aunt    Drug abuse Maternal Aunt    Asthma Cousin    Anesthesia problems Neg Hx        Review of Systems  Constitutional:  Negative for fatigue, fever, malaise/fatigue and weight loss.  HENT:  Negative for congestion, hoarse voice and sore throat.   Eyes:        Negative for visual changes  Respiratory:  Positive for cough and choking. Negative for shortness of breath and wheezing.   Cardiovascular:  Negative for chest pain, palpitations and leg swelling.  Gastrointestinal:  Positive for heartburn. Negative for abdominal pain, blood in stool, constipation, diarrhea, dysphagia, melena and nausea.  Genitourinary:  Negative for dysuria, frequency and urgency.  Musculoskeletal:  Negative for falls, joint pain, myalgias and muscle weakness.  Skin:  Negative for rash.  Neurological:  Negative for dizziness, sensory change and  headaches.  Endo/Heme/Allergies:  Does not bruise/bleed easily.  Psychiatric/Behavioral:  Negative for depression, substance abuse and suicidal ideas. The patient is not nervous/anxious and does not have insomnia.    Objective:   Vitals:   10/16/21 1148  BP: 130/84  Pulse: 86  Temp: (!) 97.3 F (36.3 C)  SpO2: 98%   Body mass index is 37.22 kg/m.  Physical Examination:  Physical Exam Vitals reviewed.  Constitutional:      General: She is not in acute distress.    Appearance: She is well-developed.  HENT:     Right Ear: Tympanic membrane, ear canal and external ear normal.     Left Ear: Tympanic membrane, ear canal and external ear normal.  Eyes:     Extraocular Movements: Extraocular movements intact.     Conjunctiva/sclera: Conjunctivae normal.     Pupils: Pupils are equal, round, and reactive to light.  Cardiovascular:     Rate and Rhythm: Normal rate and regular rhythm.     Heart sounds: Normal heart sounds.  Pulmonary:     Effort: Pulmonary effort is normal. No respiratory distress.     Breath sounds: Normal breath sounds.  Chest:     Chest wall: No tenderness.  Abdominal:     General: Bowel sounds are normal. There is no distension.     Palpations: Abdomen is soft.     Tenderness: There is no abdominal tenderness. There is no guarding.  Musculoskeletal:        General: Normal range of motion.     Cervical back: Normal range of motion and neck supple.     Right lower leg: No edema.     Left lower leg: No edema.  Skin:    General: Skin is warm and dry.  Neurological:     Mental Status: She is alert and oriented to person, place, and time.     Deep Tendon Reflexes: Reflexes are normal and symmetric.  Psychiatric:        Mood and Affect: Mood normal.        Behavior: Behavior normal.        Thought Content: Thought content  normal.   ASSESSMENT and PLAN: This visit occurred during the SARS-CoV-2 public health emergency.  Safety protocols were in place,  including screening questions prior to the visit, additional usage of staff PPE, and extensive cleaning of exam room while observing appropriate contact time as indicated for disinfecting solutions.   Ailana was seen today for follow-up.  Diagnoses and all orders for this visit:  Encounter for preventative adult health care exam with abnormal findings -     Comprehensive metabolic panel -     Lipid panel  Iron deficiency anemia due to chronic blood loss -     Iron, TIBC and Ferritin Panel -     CBC  Vitamin D deficiency -     Vitamin D 1,25 dihydroxy  Hyperglycemia -     Hemoglobin A1c  Encounter for lipid screening for cardiovascular disease -     Lipid panel  Gastroesophageal reflux disease with esophagitis without hemorrhage -     omeprazole (PRILOSEC) 20 MG capsule; Take 1 capsule (20 mg total) by mouth 2 (two) times daily before a meal.  Chronic bilateral low back pain without sciatica -     Ambulatory referral to Physical Therapy      Problem List Items Addressed This Visit       Other   Chronic back pain    Persistent She request referral to outpatient PT Advised about importance of weight loss and regular exercise.      Relevant Orders   Ambulatory referral to Physical Therapy   Iron deficiency anemia due to chronic blood loss    Persistent menorrhagia No improvement with lupron injections Followed by [place], need to schedule f/up appt. Current iron infusion by hematology, last infusion 06/19/2021.  Repeat cbc and iron panel Schedule f/up with GYN      Relevant Orders   Iron, TIBC and Ferritin Panel   CBC   Vitamin D deficiency    Repeat vit. D      Relevant Orders   Vitamin D 1,25 dihydroxy   Other Visit Diagnoses     Encounter for preventative adult health care exam with abnormal findings    -  Primary   Relevant Orders   Comprehensive metabolic panel   Lipid panel   Hyperglycemia       Relevant Orders   Hemoglobin A1c   Encounter  for lipid screening for cardiovascular disease       Relevant Orders   Lipid panel   Gastroesophageal reflux disease with esophagitis without hemorrhage       Relevant Medications   omeprazole (PRILOSEC) 20 MG capsule       Follow up: Return in about 3 months (around 01/13/2022) for weight management and GERD.  Wilfred Lacy, NP

## 2021-10-16 NOTE — Assessment & Plan Note (Addendum)
Persistent menorrhagia No improvement with lupron injections Followed by [place], need to schedule f/up appt. Current iron infusion by hematology, last infusion 06/19/2021.  Repeat cbc and iron panel Schedule f/up with GYN

## 2021-10-16 NOTE — Patient Instructions (Signed)
Go to lab for blood draw.  You will be contacted to schedule appt with PT.  How to Increase Your Level of Physical Activity Getting regular physical activity is important for your overall health and well-being. Most people do not get enough exercise. There are easy ways to increase your level of physical activity, even if you have not been very active in the past or if you are just starting out. What are the benefits of physical activity? Physical activity has many short-term and long-term benefits. Being active on a regular basis can improve your physical and mental health as well as provide other benefits. Physical health benefits Helping you lose weight or maintain a healthy weight. Strengthening your muscles and bones. Reducing your risk of certain long-term (chronic) diseases, including heart disease, cancer, and diabetes. Being able to move around more easily and for longer periods of time without getting tired (increased endurance or stamina). Improving your ability to fight off illness (enhanced immunity). Being able to sleep better. Helping you stay healthy as you get older, including: Helping you stay mobile, or capable of walking and moving around. Preventing accidents, such as falls. Increasing life expectancy. Mental health benefits Boosting your mood and improving your self-esteem. Lowering your chance of having mental health problems, such as depression or anxiety. Helping you feel good about your body. Other benefits Finding new sources of fun and enjoyment. Meeting new people who share a common interest. Before you begin If you have a chronic illness or have not been active for a while, check with your health care provider about how to get started. Ask your health care provider what activities are safe for you. Start out slowly. Walking or doing some simple chair exercises is a good place to start, especially if you have not been active before or for a long time. Set  goals that you can work toward. Ask your health care provider how much exercise is best for you. In general, most adults should: Do moderate-intensity exercise for at least 150 minutes each week (30 minutes on most days of the week) or vigorous exercise for at least 75 minutes each week, or a combination of these. Moderate-intensity exercise can include walking at a quick pace, biking, yoga, water aerobics, or gardening. Vigorous exercise involves activities that take more effort, such as jogging or running, playing sports, swimming laps, or jumping rope. Do strength exercises on at least 2 days each week. This can include weight lifting, body weight exercises, and resistance-band exercises. How to be more physically active Make a plan  Try to find activities that you enjoy. You are more likely to commit to an exercise routine if it does not feel like a chore. If you have bone or joint problems, choose low-impact exercises, like walking or swimming. Use these tips for being successful with an exercise plan: Find a workout partner for accountability. Join a group or class, such as an aerobics class, cycling class, or sports team. Make family time active. Go for a walk, bike, or swim. Include a variety of exercises each week. Consider using a fitness tracker, such as a mobile phone app or a device worn like a watch, that will count the number of steps you take each day. Many people strive to reach 10,000 steps a day. Find ways to be active in your daily routines Besides your formal exercise plans, you can find ways to do physical activity during your daily routines, such as: Walking or biking to work or to  the store. Taking the stairs instead of the elevator. Parking farther away from the door at work or at the store. Planning walking meetings. Walking around while you are on the phone. Where to find more information Centers for Disease Control and Prevention:  WorkDashboard.es President's Council on Fitness, Sports & Nutrition: www.fitness.gov ChooseMyPlate: MassVoice.es Contact a health care provider if: You have headaches, muscle aches, or joint pain that is concerning. You feel dizzy or light-headed while exercising. You faint. You feel your heart skipping, racing, or fluttering. You have chest pain while exercising. Summary Exercise benefits your mind and body at any age, even if you are just starting out. If you have a chronic illness or have not been active for a while, check with your health care provider before increasing your physical activity. Choose activities that are safe and enjoyable for you. Ask your health care provider what activities are safe for you. Start slowly. Tell your health care provider if you have problems as you start to increase your activity level. This information is not intended to replace advice given to you by your health care provider. Make sure you discuss any questions you have with your health care provider. Document Revised: 12/23/2020 Document Reviewed: 12/23/2020 Elsevier Patient Education  [place] for [employer] Calories are units of energy. Your body needs a certain number of calories from food to keep going throughout the day. When you eat or drink more calories than your body needs, your body stores the extra calories mostly as fat. When you eat or drink fewer calories than your body needs, your body burns fat to get the energy it needs. Calorie counting means keeping track of how many calories you eat and drink each day. Calorie counting can be helpful if you need to lose weight. If you eat fewer calories than your body needs, you should lose weight. Ask your health care provider what a healthy weight is for you. For calorie counting to work, you will need to eat the right number of calories each day to lose a healthy amount of weight per week. A dietitian  can help you figure out how many calories you need in a day and will suggest ways to reach your calorie goal. A healthy amount of weight to lose each week is usually 1-2 lb (0.5-0.9 kg). This usually means that your daily calorie intake should be reduced by 500-750 calories. Eating 1,200-1,500 calories a day can help most women lose weight. Eating 1,500-1,800 calories a day can help most men lose weight. What do I need to know about calorie counting? Work with your health care provider or dietitian to determine how many calories you should get each day. To meet your daily calorie goal, you will need to: Find out how many calories are in each food that you would like to eat. Try to do this before you eat. Decide how much of the food you plan to eat. Keep a food log. Do this by writing down what you ate and how many calories it had. To successfully lose weight, it is important to balance calorie counting with a healthy lifestyle that includes regular activity. Where do I find calorie information? The number of calories in a food can be found on a Nutrition Facts label. If a food does not have a Nutrition Facts label, try to look up the calories online or ask your dietitian for help. Remember that calories are listed per serving. If you choose to  have more than one serving of a food, you will have to multiply the calories per serving by the number of servings you plan to eat. For example, the label on a package of bread might say that a serving size is 1 slice and that there are 90 calories in a serving. If you eat 1 slice, you will have eaten 90 calories. If you eat 2 slices, you will have eaten 180 calories. How do I keep a food log? After each time that you eat, record the following in your food log as soon as possible: What you ate. Be sure to include toppings, sauces, and other extras on the food. How much you ate. This can be measured in cups, ounces, or number of items. How many calories were in  each food and drink. The total number of calories in the food you ate. Keep your food log near you, such as in a pocket-sized notebook or on an app or website on your mobile phone. Some programs will calculate calories for you and show you how many calories you have left to meet your daily goal. What are some portion-control tips? Know how many calories are in a serving. This will help you know how many servings you can have of a certain food. Use a measuring cup to measure serving sizes. You could also try weighing out portions on a kitchen scale. With time, you will be able to estimate serving sizes for some foods. Take time to put servings of different foods on your favorite plates or in your favorite bowls and cups so you know what a serving looks like. Try not to eat straight from a food's packaging, such as from a bag or box. Eating straight from the package makes it hard to see how much you are eating and can lead to overeating. Put the amount you would like to eat in a cup or on a plate to make sure you are eating the right portion. Use smaller plates, glasses, and bowls for smaller portions and to prevent overeating. Try not to multitask. For example, avoid watching TV or using your computer while eating. If it is time to eat, sit down at a table and enjoy your food. This will help you recognize when you are full. It will also help you be more mindful of what and how much you are eating. What are tips for following this plan? Reading food labels Check the calorie count compared with the serving size. The serving size may be smaller than what you are used to eating. Check the source of the calories. Try to choose foods that are high in protein, fiber, and vitamins, and low in saturated fat, trans fat, and sodium. Shopping Read nutrition labels while you shop. This will help you make healthy decisions about which foods to buy. Pay attention to nutrition labels for low-fat or fat-free foods.  These foods sometimes have the same number of calories or more calories than the full-fat versions. They also often have added sugar, starch, or salt to make up for flavor that was removed with the fat. Make a grocery list of lower-calorie foods and stick to it. Cooking Try to cook your favorite foods in a healthier way. For example, try baking instead of frying. Use low-fat dairy products. Meal planning Use more fruits and vegetables. One-half of your plate should be fruits and vegetables. Include lean proteins, such as chicken, [place], and fish. Lifestyle Each week, aim to do one of the following:  150 minutes of moderate exercise, such as walking. 75 minutes of vigorous exercise, such as running. General information Know how many calories are in the foods you eat most often. This will help you calculate calorie counts faster. Find a way of tracking calories that works for you. Get creative. Try different apps or programs if writing down calories does not work for you. What foods should I eat?  Eat nutritious foods. It is better to have a nutritious, high-calorie food, such as an avocado, than a food with few nutrients, such as a bag of potato chips. Use your calories on foods and drinks that will fill you up and will not leave you hungry soon after eating. Examples of foods that fill you up are nuts and nut butters, vegetables, lean proteins, and high-fiber foods such as whole grains. High-fiber foods are foods with more than 5 g of fiber per serving. Pay attention to calories in drinks. Low-calorie drinks include water and unsweetened drinks. The items listed above may not be a complete list of foods and beverages you can eat. Contact a dietitian for more information. What foods should I limit? Limit foods or drinks that are not good sources of vitamins, minerals, or protein or that are high in unhealthy fats. These include: Candy. Other sweets. Sodas, specialty coffee drinks, alcohol,  and juice. The items listed above may not be a complete list of foods and beverages you should avoid. Contact a dietitian for more information. How do I count calories when eating out? Pay attention to portions. Often, portions are much larger when eating out. Try these tips to keep portions smaller: Consider sharing a meal instead of getting your own. If you get your own meal, eat only half of it. Before you start eating, ask for a container and put half of your meal into it. When available, consider ordering smaller portions from the menu instead of full portions. Pay attention to your food and drink choices. Knowing the way food is cooked and what is included with the meal can help you eat fewer calories. If calories are listed on the menu, choose the lower-calorie options. Choose dishes that include vegetables, fruits, whole grains, low-fat dairy products, and lean proteins. Choose items that are boiled, broiled, grilled, or steamed. Avoid items that are buttered, battered, fried, or served with cream sauce. Items labeled as crispy are usually fried, unless stated otherwise. Choose water, low-fat milk, unsweetened iced tea, or other drinks without added sugar. If you want an alcoholic beverage, choose a lower-calorie option, such as a glass of wine or light beer. Ask for dressings, sauces, and syrups on the side. These are usually high in calories, so you should limit the amount you eat. If you want a salad, choose a garden salad and ask for grilled meats. Avoid extra toppings such as bacon, cheese, or fried items. Ask for the dressing on the side, or ask for olive oil and vinegar or lemon to use as dressing. Estimate how many servings of a food you are given. Knowing serving sizes will help you be aware of how much food you are eating at restaurants. Where to find more information Centers for Disease Control and Prevention: http://www.wolf.info/ U.S. Department of Agriculture:  http://www.wilson-mendoza.org/ Summary Calorie counting means keeping track of how many calories you eat and drink each day. If you eat fewer calories than your body needs, you should lose weight. A healthy amount of weight to lose per week is usually 1-2 lb (0.5-0.9 kg).  This usually means reducing your daily calorie intake by 500-750 calories. The number of calories in a food can be found on a Nutrition Facts label. If a food does not have a Nutrition Facts label, try to look up the calories online or ask your dietitian for help. Use smaller plates, glasses, and bowls for smaller portions and to prevent overeating. Use your calories on foods and drinks that will fill you up and not leave you hungry shortly after a meal. This information is not intended to replace advice given to you by your health care provider. Make sure you discuss any questions you have with your health care provider. Document Revised: 10/08/2019 Document Reviewed: 10/08/2019 Elsevier Patient Education  2022 [employer].

## 2021-10-16 NOTE — Assessment & Plan Note (Signed)
Repeat vit. D

## 2021-10-21 LAB — IRON,TIBC AND FERRITIN PANEL
%SAT: 17 % (calc) (ref 16–45)
Ferritin: 31 ng/mL (ref 16–232)
Iron: 59 ug/dL (ref 40–190)
TIBC: 346 mcg/dL (calc) (ref 250–450)

## 2021-10-21 LAB — VITAMIN D 1,25 DIHYDROXY
Vitamin D 1, 25 (OH)2 Total: 66 pg/mL (ref 18–72)
Vitamin D2 1, 25 (OH)2: 66 pg/mL
Vitamin D3 1, 25 (OH)2: <8 pg/mL

## 2021-10-25 ENCOUNTER — Other Ambulatory Visit: Payer: Self-pay | Admitting: Nurse Practitioner

## 2021-10-25 DIAGNOSIS — K2100 Gastro-esophageal reflux disease with esophagitis, without bleeding: Secondary | ICD-10-CM

## 2021-10-27 ENCOUNTER — Encounter: Payer: Self-pay | Admitting: Oncology

## 2021-11-20 ENCOUNTER — Ambulatory Visit: Payer: BC Managed Care – PPO | Admitting: Physical Therapy

## 2021-11-20 ENCOUNTER — Encounter: Payer: BC Managed Care – PPO | Admitting: Nurse Practitioner

## 2021-11-27 ENCOUNTER — Encounter: Payer: BC Managed Care – PPO | Admitting: Nurse Practitioner

## 2022-01-12 ENCOUNTER — Telehealth: Payer: Self-pay | Admitting: Nurse Practitioner

## 2022-01-12 ENCOUNTER — Ambulatory Visit: Payer: BC Managed Care – PPO | Admitting: Nurse Practitioner

## 2022-01-12 NOTE — Telephone Encounter (Signed)
Pt was a no show for an OV with [facility] on 01/12/22, I sent a no show letter.  ?

## 2022-01-18 NOTE — Telephone Encounter (Signed)
01/25/21 no show ?02/28/21 no show, letter sent ?01/12/22 no show ? ?Please advise if you want to proceed with dismissal ?

## 2022-01-26 ENCOUNTER — Encounter: Payer: Self-pay | Admitting: Nurse Practitioner

## 2022-10-18 ENCOUNTER — Telehealth: Payer: BC Managed Care – PPO | Admitting: Nurse Practitioner

## 2022-10-18 DIAGNOSIS — U071 COVID-19: Secondary | ICD-10-CM | POA: Diagnosis not present

## 2022-10-18 MED ORDER — NIRMATRELVIR/RITONAVIR (PAXLOVID)TABLET: 3.0000 | ORAL_TABLET | Freq: Two times a day (BID) | ORAL | 0 refills | Status: AC

## 2022-10-18 MED ORDER — ALBUTEROL SULFATE HFA 108 (90 BASE) MCG/ACT IN AERS: 2.0000 | INHALATION_SPRAY | Freq: Four times a day (QID) | RESPIRATORY_TRACT | 0 refills | Status: DC | PRN

## 2022-10-18 MED ORDER — BENZONATATE 100 MG PO CAPS: 100.0000 mg | ORAL_CAPSULE | Freq: Three times a day (TID) | ORAL | 0 refills | Status: DC | PRN

## 2022-10-18 NOTE — Telephone Encounter (Signed)
Patient wasn't aware she was dismissed and wanted to know if she can please be given another chance. She also wanted to let Baldo Ash know she is sick with covid

## 2022-10-18 NOTE — Progress Notes (Signed)
Virtual Visit Consent   Erika David, you are scheduled for a virtual visit with a [facility] provider today. Just as with appointments in the office, your consent must be obtained to participate. Your consent will be active for this visit and any virtual visit you may have with one of our providers in the next 365 days. If you have a MyChart account, a copy of this consent can be sent to you electronically.  As this is a virtual visit, video technology does not allow for your provider to perform a traditional examination. This may limit your provider's ability to fully assess your condition. If your provider identifies any concerns that need to be evaluated in person or the need to arrange testing (such as labs, EKG, etc.), we will make arrangements to do so. Although advances in technology are sophisticated, we cannot ensure that it will always work on either your end or our end. If the connection with a video visit is poor, the visit may have to be switched to a telephone visit. With either a video or telephone visit, we are not always able to ensure that we have a secure connection.  By engaging in this virtual visit, you consent to the provision of healthcare and authorize for your insurance to be billed (if applicable) for the services provided during this visit. Depending on your insurance coverage, you may receive a charge related to this service.  I need to obtain your verbal consent now. Are you willing to proceed with your visit today? Rylynn L Schramm has provided verbal consent on 10/18/2022 for a virtual visit (video or telephone). Apolonio Schneiders, FNP  Date: 10/18/2022 3:53 PM  Virtual Visit via Video Note   I, Apolonio Schneiders, connected with  Erika David  ([id number], 1976/01/09) on 10/18/22 at  4:00 PM EST by a video-enabled telemedicine application and verified that I am speaking with the correct person using two identifiers.  Location: Patient: Virtual Visit Location Patient:  Home Provider: Virtual Visit Location Provider: Home Office   I discussed the limitations of evaluation and management by telemedicine and the availability of in person appointments. The patient expressed understanding and agreed to proceed.    History of Present Illness: Erika David is a 47 y.o. who identifies as a female who was assigned female at birth, and is being seen today after testing positive for COVID today.  Symptom onset was 2 days ago   Symptoms include: Congestion, runny nose, body aches, headaches, loss of taste and smell and sore throat  She has had COVID in the past and was hospitalized in 2021  She has been vaccinated for COVID   She denies a history of asthma but has needed an inhaler since having COVID previously    Had labs done 10/16/21  Denies a history of kidney disease or conditions  GFR 84  Denies pregnancy   Problems:  Patient Active Problem List   Diagnosis Date Noted   Vitamin D deficiency 05/22/2021   Chronic back pain 01/30/2021   S/P tubal ligation 01/30/2021   Abnormal CT scan, colon 08/11/2020   Menorrhagia 02/15/2020   Iron deficiency anemia due to chronic blood loss 11/30/2019   Lymphocytic colitis 11/13/2019   Generalized weakness 11/09/2019   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/2018   Perennial allergic rhinitis 10/17/2018   Genital herpes simplex 01/31/2017   S/P cesarean section 04/12/2012   Obesity 01/04/2012   Anemia 01/04/2012    Allergies:  Allergies  Allergen Reactions   Bee Pollen Anaphylaxis    Tree pollen in environment Tree pollen in environment   Dust Mite Extract Anaphylaxis   Kiwi Extract Itching and Swelling   Pollen Extract Anaphylaxis    Tree pollen in environment   Other Rash   Soy Allergy Other (See Comments)    Per allergy testing Per allergy testing   Venofer [Iron Sucrose] Hives and Itching    Patient developed hives and itching at the end of 30 minute observation period. Dr.  Jana Hakim aware and will add premedications to future iron infusions.    Medications:  Current Outpatient Medications:    albuterol (VENTOLIN HFA) 108 (90 Base) MCG/ACT inhaler, Inhale 2 puffs into the lungs every 6 (six) hours as needed for wheezing or shortness of breath., Disp: 6.7 g, Rfl: 0   Cetirizine HCl (ZYRTEC PO), Take by mouth., Disp: , Rfl:    dicyclomine (BENTYL) 10 MG capsule, Take 1 capsule (10 mg total) by mouth 4 (four) times daily -  before meals and at bedtime., Disp: 120 capsule, Rfl: 2   omeprazole (PRILOSEC) 20 MG capsule, Take 1 capsule (20 mg total) by mouth 2 (two) times daily before a meal., Disp: 28 capsule, Rfl: 0   Vitamin D, Ergocalciferol, (DRISDOL) 1.25 MG (50000 UNIT) CAPS capsule, TAKE 1 CAPSULE (50,000 UNITS TOTAL) BY MOUTH EVERY 7 (SEVEN) DAYS (Patient not taking: Reported on 10/16/2021), Disp: 12 capsule, Rfl: 0  Observations/Objective: Patient is well-developed, well-nourished in no acute distress.  Resting comfortably  at home.  Head is normocephalic, atraumatic.  No labored breathing.  Speech is clear and coherent with logical content.  Patient is alert and oriented at baseline.    Assessment and Plan: 1. COVID-19 Take anti-viral with food  - nirmatrelvir/ritonavir (PAXLOVID) 20 x 150 MG & 10 x 100MG  TABS; Take 3 tablets by mouth 2 (two) times daily for 5 days. (Take nirmatrelvir 150 mg two tablets twice daily for 5 days and ritonavir 100 mg one tablet twice daily for 5 days) Patient GFR is 84  Dispense: 30 tablet; Refill: 0 - albuterol (VENTOLIN HFA) 108 (90 Base) MCG/ACT inhaler; Inhale 2 puffs into the lungs every 6 (six) hours as needed for wheezing or shortness of breath.  Dispense: 8 g; Refill: 0 - benzonatate (TESSALON) 100 MG capsule; Take 1 capsule (100 mg total) by mouth 3 (three) times daily as needed.  Dispense: 30 capsule; Refill: 0     Follow Up Instructions: I discussed the assessment and treatment plan with the patient. The patient was  provided an opportunity to ask questions and all were answered. The patient agreed with the plan and demonstrated an understanding of the instructions.  A copy of instructions were sent to the patient via MyChart unless otherwise noted below.    The patient was advised to call back or seek an in-person evaluation if the symptoms worsen or if the condition fails to improve as anticipated.  Time:  I spent 12 minutes with the patient via telehealth technology discussing the above problems/concerns.    Apolonio Schneiders, FNP

## 2022-10-18 NOTE — Telephone Encounter (Signed)
She called back and said that the only reason she missed the appt last year was because her mom was in hospice and had passed away

## 2022-10-22 NOTE — Telephone Encounter (Signed)
LVM for pt to call the office. Pt is able to VV if needed for sick appt/covid.  Pt needs to speak to supervisor re: reverse of dismissal & additional no show would reinstate dismissal.

## 2022-11-15 NOTE — Telephone Encounter (Signed)
LVM for pt to call office if still wanting to re-est care.  See below. Pt still marked as dismissed at this time.

## 2023-04-11 ENCOUNTER — Other Ambulatory Visit: Payer: Self-pay

## 2023-04-11 ENCOUNTER — Emergency Department (HOSPITAL_BASED_OUTPATIENT_CLINIC_OR_DEPARTMENT_OTHER): Payer: BC Managed Care – PPO | Admitting: Radiology

## 2023-04-11 ENCOUNTER — Emergency Department (HOSPITAL_BASED_OUTPATIENT_CLINIC_OR_DEPARTMENT_OTHER): Payer: BC Managed Care – PPO

## 2023-04-11 ENCOUNTER — Encounter (HOSPITAL_BASED_OUTPATIENT_CLINIC_OR_DEPARTMENT_OTHER): Payer: Self-pay | Admitting: Emergency Medicine

## 2023-04-11 ENCOUNTER — Emergency Department (HOSPITAL_BASED_OUTPATIENT_CLINIC_OR_DEPARTMENT_OTHER)
Admission: EM | Admit: 2023-04-11 | Discharge: 2023-04-11 | Disposition: A | Discharge status: Home / Self Care | Payer: BC Managed Care – PPO | Attending: Emergency Medicine | Admitting: Emergency Medicine

## 2023-04-11 DIAGNOSIS — K8689 Other specified diseases of pancreas: Secondary | ICD-10-CM | POA: Diagnosis not present

## 2023-04-11 DIAGNOSIS — I10 Essential (primary) hypertension: Secondary | ICD-10-CM | POA: Diagnosis not present

## 2023-04-11 DIAGNOSIS — M25511 Pain in right shoulder: Secondary | ICD-10-CM | POA: Insufficient documentation

## 2023-04-11 DIAGNOSIS — R079 Chest pain, unspecified: Secondary | ICD-10-CM | POA: Insufficient documentation

## 2023-04-11 DIAGNOSIS — Z041 Encounter for examination and observation following transport accident: Secondary | ICD-10-CM | POA: Diagnosis not present

## 2023-04-11 DIAGNOSIS — Y92410 Unspecified street and highway as the place of occurrence of the external cause: Secondary | ICD-10-CM | POA: Diagnosis not present

## 2023-04-11 DIAGNOSIS — M5030 Other cervical disc degeneration, unspecified cervical region: Secondary | ICD-10-CM | POA: Diagnosis not present

## 2023-04-11 DIAGNOSIS — M47812 Spondylosis without myelopathy or radiculopathy, cervical region: Secondary | ICD-10-CM | POA: Diagnosis not present

## 2023-04-11 DIAGNOSIS — R0789 Other chest pain: Secondary | ICD-10-CM | POA: Diagnosis not present

## 2023-04-11 DIAGNOSIS — N3289 Other specified disorders of bladder: Secondary | ICD-10-CM | POA: Diagnosis not present

## 2023-04-11 DIAGNOSIS — K429 Umbilical hernia without obstruction or gangrene: Secondary | ICD-10-CM | POA: Diagnosis not present

## 2023-04-11 DIAGNOSIS — S4991XA Unspecified injury of right shoulder and upper arm, initial encounter: Secondary | ICD-10-CM | POA: Diagnosis not present

## 2023-04-11 LAB — BASIC METABOLIC PANEL
Anion gap: 10 (ref 5–15)
BUN: 7 mg/dL (ref 6–20)
CO2: 24 mmol/L (ref 22–32)
Calcium: 9.3 mg/dL (ref 8.9–10.3)
Chloride: 105 mmol/L (ref 98–111)
Creatinine, Ser: 0.89 mg/dL (ref 0.44–1.00)
GFR, Estimated: >60 mL/min (ref >60)
Glucose, Bld: 87 mg/dL (ref 70–99)
Potassium: 3.5 mmol/L (ref 3.5–5.1)
Sodium: 139 mmol/L (ref 135–145)

## 2023-04-11 LAB — CBC WITH DIFFERENTIAL/PLATELET
Abs Immature Granulocytes: 0.03 10*3/uL (ref 0.00–0.07)
Basophils Absolute: 0.1 10*3/uL (ref 0.0–0.1)
Basophils Relative: 1 %
Eosinophils Absolute: 0.3 10*3/uL (ref 0.0–0.5)
Eosinophils Relative: 4 %
HCT: 31.9 % — ABNORMAL LOW (ref 36.0–46.0)
Hemoglobin: 10.1 g/dL — ABNORMAL LOW (ref 12.0–15.0)
Immature Granulocytes: 0 %
Lymphocytes Relative: 28 %
Lymphs Abs: 2 10*3/uL (ref 0.7–4.0)
MCH: 22.9 pg — ABNORMAL LOW (ref 26.0–34.0)
MCHC: 31.7 g/dL (ref 30.0–36.0)
MCV: 72.2 fL — ABNORMAL LOW (ref 80.0–100.0)
Monocytes Absolute: 0.6 10*3/uL (ref 0.1–1.0)
Monocytes Relative: 8 %
Neutro Abs: 4.3 10*3/uL (ref 1.7–7.7)
Neutrophils Relative %: 59 %
Platelets: 247 10*3/uL (ref 150–400)
RBC: 4.42 MIL/uL (ref 3.87–5.11)
RDW: 17.9 % — ABNORMAL HIGH (ref 11.5–15.5)
WBC: 7.2 10*3/uL (ref 4.0–10.5)
nRBC: 0 % (ref 0.0–0.2)

## 2023-04-11 LAB — TROPONIN I (HIGH SENSITIVITY)
Troponin I (High Sensitivity): 3 ng/L (ref <18)
Troponin I (High Sensitivity): 4 ng/L (ref <18)

## 2023-04-11 MED ORDER — ONDANSETRON HCL 4 MG/2ML IJ SOLN
4.0000 mg | Freq: Once | INTRAMUSCULAR | Status: AC
  Administered 2023-04-11: 4 mg via INTRAVENOUS
  Filled 2023-04-11: qty 2

## 2023-04-11 MED ORDER — METHOCARBAMOL 500 MG PO TABS: 500.0000 mg | ORAL_TABLET | Freq: Two times a day (BID) | ORAL | 0 refills | Status: DC

## 2023-04-11 MED ORDER — IOHEXOL 350 MG/ML SOLN
100.0000 mL | Freq: Once | INTRAVENOUS | Status: AC | PRN
  Administered 2023-04-11: 100 mL via INTRAVENOUS

## 2023-04-11 MED ORDER — FENTANYL CITRATE PF 50 MCG/ML IJ SOSY
50.0000 ug | PREFILLED_SYRINGE | Freq: Once | INTRAMUSCULAR | Status: AC
  Administered 2023-04-11: 50 ug via INTRAVENOUS
  Filled 2023-04-11: qty 1
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
## 2023-04-11 NOTE — ED Notes (Signed)
This RN attempted IV x1, unsuccessful

## 2023-04-11 NOTE — Discharge Instructions (Addendum)
I would like for you to take 600 mg of ibuprofen every 6 hours as needed for pain.  You can stack on Tylenol at the 3 to 4-hour mark for additional pain control.  I would recommend starting to take that today as you will be sore tomorrow.  All of your imaging was reassuring as well as your lab work. You will be sent a prescription for robaxin (muscle relaxer), do not drive or operate heavy machinery while taking this medication as it can cause you to become sleepy/drowsy. I like for you to follow-up with your primary care doctor for further evaluation if you have one. You may return to the emergency ferment for any worsening symptoms.
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
## 2023-04-11 NOTE — ED Notes (Signed)
Pt ambulated to bathroom without assistance 

## 2023-04-11 NOTE — ED Notes (Signed)
Pt reporting L side chest pain that increases when she moves or takes adeep breath. Conner PA made aware

## 2023-04-11 NOTE — ED Notes (Signed)
Pt started to complain of CP on the left/left center... Stated that its a intermittent issue and this episode just started... No increase in SOB... Pain worsens upon taking a deep breath... Pain does not radiate from the left side... RN and Provider notified.Marland KitchenMarland Kitchen

## 2023-04-11 NOTE — ED Notes (Signed)
ED Provider at bedside. 

## 2023-04-11 NOTE — ED Notes (Signed)
Patient transported to CT 
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
## 2023-04-11 NOTE — ED Notes (Signed)
 RN reviewed discharge instructions with pt. Pt verbalized understanding and had no further questions. VSS upon discharge.  

## 2023-04-11 NOTE — ED Notes (Signed)
Pt had episode of emesis, Conner PA made aware

## 2023-04-16 ENCOUNTER — Telehealth: Payer: Self-pay | Admitting: Nurse Practitioner

## 2023-04-16 NOTE — Telephone Encounter (Signed)
Pt called and would like for you to give her a call back.. she pesonally ask for you

## 2023-04-16 NOTE — Telephone Encounter (Signed)
Spoke with pt and per notes from 10/2022 advised pt we can reverse dismissal and notified her additional missed visits (no show, late cancel, late arrival) will lead back to dismissal.   Appt sch for 8/8 11:00a and notified pt arrival time 10:45a.

## 2023-04-18 ENCOUNTER — Ambulatory Visit (INDEPENDENT_AMBULATORY_CARE_PROVIDER_SITE_OTHER): Payer: BC Managed Care – PPO | Admitting: Nurse Practitioner

## 2023-04-18 ENCOUNTER — Encounter: Payer: Self-pay | Admitting: Nurse Practitioner

## 2023-04-18 DIAGNOSIS — K52832 Lymphocytic colitis: Secondary | ICD-10-CM | POA: Diagnosis not present

## 2023-04-18 DIAGNOSIS — G8929 Other chronic pain: Secondary | ICD-10-CM | POA: Diagnosis not present

## 2023-04-18 DIAGNOSIS — D500 Iron deficiency anemia secondary to blood loss (chronic): Secondary | ICD-10-CM

## 2023-04-18 DIAGNOSIS — M545 Low back pain, unspecified: Secondary | ICD-10-CM | POA: Diagnosis not present

## 2023-04-18 DIAGNOSIS — M5136 Other intervertebral disc degeneration, lumbar region: Secondary | ICD-10-CM

## 2023-04-18 MED ORDER — CYCLOBENZAPRINE HCL 5 MG PO TABS
5.0000 mg | ORAL_TABLET | Freq: Every day | ORAL | 0 refills | Status: DC
Start: 2023-04-18 — End: 2024-03-09

## 2023-04-18 MED ORDER — PREDNISONE 10 MG (21) PO TBPK: ORAL_TABLET | ORAL | 0 refills | Status: DC
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
## 2023-04-18 NOTE — Assessment & Plan Note (Addendum)
No diarrhea or blood in stool or ABDOMEN pain Advised to schedule f/up appointment with GI
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
## 2023-04-18 NOTE — Assessment & Plan Note (Signed)
Reports intermittent dizziness Last iron infusion 2022, does not take oral iron supplement. S/p tubal ligation.    Latest Ref Rng & Units 04/11/2023    4:25 PM 10/16/2021   12:17 PM 05/22/2021    9:27 AM  CBC  WBC 4.0 - 10.5 K/uL 7.2  5.9  4.5   Hemoglobin 12.0 - 15.0 g/dL 91.4  78.2  95.6   Hematocrit 36.0 - 46.0 % 31.9  40.1  34.4   Platelets 150 - 400 K/uL 247  210.0  263.0     Repeat iron panel

## 2023-04-18 NOTE — Patient Instructions (Addendum)
Ok to return to work. Go to lab Continue tylenol as needed for pain Use warm compress as needed Go to [address] for back x-ray.

## 2023-05-07 DIAGNOSIS — H5213 Myopia, bilateral: Secondary | ICD-10-CM | POA: Diagnosis not present

## 2023-05-08 ENCOUNTER — Ambulatory Visit (INDEPENDENT_AMBULATORY_CARE_PROVIDER_SITE_OTHER): Payer: BC Managed Care – PPO | Admitting: Nurse Practitioner

## 2023-05-08 ENCOUNTER — Ambulatory Visit (INDEPENDENT_AMBULATORY_CARE_PROVIDER_SITE_OTHER)
Admission: RE | Admit: 2023-05-08 | Discharge: 2023-05-08 | Disposition: A | Discharge status: Home / Self Care | Payer: BC Managed Care – PPO | Source: Ambulatory Visit | Attending: Nurse Practitioner | Admitting: Nurse Practitioner

## 2023-05-08 ENCOUNTER — Other Ambulatory Visit: Payer: Self-pay | Admitting: Nurse Practitioner

## 2023-05-08 ENCOUNTER — Encounter: Payer: Self-pay | Admitting: Nurse Practitioner

## 2023-05-08 VITALS — BP 120/80 | HR 86 | Temp 98.2°F | Resp 16 | Ht 61.0 in | Wt 199.0 lb

## 2023-05-08 DIAGNOSIS — Z6837 Body mass index (BMI) 37.0-37.9, adult: Secondary | ICD-10-CM

## 2023-05-08 DIAGNOSIS — Z041 Encounter for examination and observation following transport accident: Secondary | ICD-10-CM | POA: Diagnosis not present

## 2023-05-08 DIAGNOSIS — R7303 Prediabetes: Secondary | ICD-10-CM

## 2023-05-08 DIAGNOSIS — Z23 Encounter for immunization: Secondary | ICD-10-CM | POA: Diagnosis not present

## 2023-05-08 DIAGNOSIS — G8929 Other chronic pain: Secondary | ICD-10-CM | POA: Diagnosis not present

## 2023-05-08 DIAGNOSIS — D500 Iron deficiency anemia secondary to blood loss (chronic): Secondary | ICD-10-CM | POA: Diagnosis not present

## 2023-05-08 DIAGNOSIS — Z0000 Encounter for general adult medical examination without abnormal findings: Secondary | ICD-10-CM | POA: Diagnosis not present

## 2023-05-08 DIAGNOSIS — Z0001 Encounter for general adult medical examination with abnormal findings: Secondary | ICD-10-CM

## 2023-05-08 DIAGNOSIS — M549 Dorsalgia, unspecified: Secondary | ICD-10-CM | POA: Diagnosis not present

## 2023-05-08 DIAGNOSIS — Z136 Encounter for screening for cardiovascular disorders: Secondary | ICD-10-CM | POA: Diagnosis not present

## 2023-05-08 DIAGNOSIS — M545 Low back pain, unspecified: Secondary | ICD-10-CM | POA: Diagnosis not present

## 2023-05-08 DIAGNOSIS — Z800 Family history of malignant neoplasm of digestive organs: Secondary | ICD-10-CM | POA: Insufficient documentation

## 2023-05-08 DIAGNOSIS — Z1231 Encounter for screening mammogram for malignant neoplasm of breast: Secondary | ICD-10-CM

## 2023-05-08 DIAGNOSIS — E559 Vitamin D deficiency, unspecified: Secondary | ICD-10-CM | POA: Diagnosis not present

## 2023-05-08 DIAGNOSIS — Z13220 Encounter for screening for lipoid disorders: Secondary | ICD-10-CM

## 2023-05-08 DIAGNOSIS — M47816 Spondylosis without myelopathy or radiculopathy, lumbar region: Secondary | ICD-10-CM | POA: Diagnosis not present

## 2023-05-08 LAB — LIPID PANEL
Cholesterol: 199 mg/dL (ref 0–200)
HDL: 46.6 mg/dL (ref >39.00)
LDL Cholesterol: 116 mg/dL — ABNORMAL HIGH (ref 0–99)
NonHDL: 152.4
Total CHOL/HDL Ratio: 4
Triglycerides: 183 mg/dL — ABNORMAL HIGH (ref 0.0–149.0)
VLDL: 36.6 mg/dL (ref 0.0–40.0)

## 2023-05-08 LAB — COMPREHENSIVE METABOLIC PANEL
ALT: 9 U/L (ref 0–35)
AST: 10 U/L (ref 0–37)
Albumin: 4.1 g/dL (ref 3.5–5.2)
Alkaline Phosphatase: 113 U/L (ref 39–117)
BUN: 9 mg/dL (ref 6–23)
CO2: 26 mEq/L (ref 19–32)
Calcium: 9 mg/dL (ref 8.4–10.5)
Chloride: 104 mEq/L (ref 96–112)
Creatinine, Ser: 0.82 mg/dL (ref 0.40–1.20)
GFR: 86.3 mL/min (ref >60.00)
Glucose, Bld: 93 mg/dL (ref 70–99)
Potassium: 3.6 mEq/L (ref 3.5–5.1)
Sodium: 138 mEq/L (ref 135–145)
Total Bilirubin: 0.2 mg/dL (ref 0.2–1.2)
Total Protein: 6.9 g/dL (ref 6.0–8.3)

## 2023-05-08 LAB — TSH: TSH: 0.73 u[IU]/mL (ref 0.35–5.50)

## 2023-05-08 LAB — HEMOGLOBIN A1C: Hgb A1c MFr Bld: 6.3 % (ref 4.6–6.5)

## 2023-05-08 LAB — VITAMIN D 25 HYDROXY (VIT D DEFICIENCY, FRACTURES): VITD: 17.65 ng/mL — ABNORMAL LOW (ref 30.00–100.00)

## 2023-05-08 MED ORDER — WEGOVY 0.25 MG/0.5ML ~~LOC~~ SOAJ
0.2500 mg | SUBCUTANEOUS | 0 refills | Status: DC
Start: 2023-05-08 — End: 2024-03-09
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
## 2023-05-08 NOTE — Assessment & Plan Note (Signed)
Repeat vit. D Advised to start OVER THE COUNTER dose 2000IU daily

## 2023-05-08 NOTE — Assessment & Plan Note (Signed)
Reports difficulty with weight loss despite eating daily. Limited exercise due to chronic fatigue, secondary to iron deficient anemia. BP Readings from Last 3 Encounters:  05/08/23 120/80  04/18/23 128/84  04/11/23 125/85    We discussed use of wegovy vs phentermine vs contrave, and possible side effects. Advised to maintain at least daily, daily exercise, schedule appointment with hematology for iron infusion. [facility] rx sent Check THYROID, CMP, hgbA1c, and lipid panel F/up in 57month

## 2023-05-08 NOTE — Patient Instructions (Addendum)
Go to [address] for back x-ray Schedule appointment with hematology. Maintain iron rich diet and daily exercise Schedule appointment for mammogram Start wegovy Go to lab  Preventive Care 38-45 Years Old, Female Preventive care refers to lifestyle choices and visits with your health care provider that can promote health and wellness. Preventive care visits are also called wellness exams. What can I expect for my preventive care visit? Counseling Your health care provider may ask you questions about your: Medical history, including: Past medical problems. Family medical history. Pregnancy history. Current health, including: Menstrual cycle. Method of birth control. Emotional well-being. Home life and relationship well-being. Sexual activity and sexual health. Lifestyle, including: Alcohol, nicotine or tobacco, and drug use. Access to firearms. Diet, exercise, and sleep habits. Work and work Astron[employer]. Sunscreen use. Safety issues such as seatbelt and bike helmet use. Physical exam Your health care provider will check your: Height and weight. These may be used to calculate your BMI (body mass index). BMI is a measurement that tells if you are at a healthy weight. Waist circumference. This measures the distance around your waistline. This measurement also tells if you are at a healthy weight and may help predict your risk of certain diseases, such as type 2 diabetes and high blood pressure. Heart rate and blood pressure. Body temperature. Skin for abnormal spots. What immunizations do I need?  Vaccines are usually given at various ages, according to a schedule. Your health care provider will recommend vaccines for you based on your age, medical history, and lifestyle or other factors, such as travel or where you work. What tests do I need? Screening Your health care provider may recommend screening tests for certain conditions. This may include: Lipid and cholesterol  levels. Diabetes screening. This is done by checking your blood sugar (glucose) after you have not eaten for a while (fasting). Pelvic exam and Pap test. Hepatitis B test. Hepatitis C test. HIV (human immunodeficiency virus) test. STI (sexually transmitted infection) testing, if you are at risk. Lung cancer screening. Colorectal cancer screening. Mammogram. Talk with your health care provider about when you should start having regular mammograms. This may depend on whether you have a family history of breast cancer. BRCA-related cancer screening. This may be done if you have a family history of breast, ovarian, tubal, or peritoneal cancers. Bone density scan. This is done to screen for osteoporosis. Talk with your health care provider about your test results, treatment options, and if necessary, the need for more tests. Follow these instructions at home: Eating and drinking  Eat a diet that includes fresh fruits and vegetables, whole grains, lean protein, and low-fat dairy products. Take vitamin and mineral supplements as recommended by your health care provider. Do not drink alcohol if: Your health care provider tells you not to drink. You are pregnant, may be pregnant, or are planning to become pregnant. If you drink alcohol: Limit how much you have to 0-1 drink a day. Know how much alcohol is in your drink. In the [place] one drink equals one 12 oz bottle of beer (355 mL), one 5 oz glass of wine (148 mL), or one 1 oz glass of hard liquor (44 mL). Lifestyle Brush your teeth every morning and night with fluoride toothpaste. Floss one time each day. Exercise for at least 30 minutes 5 or more days each week. Do not use any products that contain nicotine or tobacco. These products include cigarettes, chewing tobacco, and vaping devices, such as e-cigarettes. If  you need help quitting, ask your health care provider. Do not use drugs. If you are sexually active, practice safe sex. Use a  condom or other form of protection to prevent STIs. If you do not wish to become pregnant, use a form of birth control. If you plan to become pregnant, see your health care provider for a prepregnancy visit. Take aspirin only as told by your health care provider. Make sure that you understand how much to take and what form to take. Work with your health care provider to find out whether it is safe and beneficial for you to take aspirin daily. Find healthy ways to manage stress, such as: Meditation, yoga, or listening to music. Journaling. Talking to a trusted person. Spending time with friends and family. Minimize exposure to UV radiation to reduce your risk of skin cancer. Safety Always wear your seat belt while driving or riding in a vehicle. Do not drive: If you have been drinking alcohol. Do not ride with someone who has been drinking. When you are tired or distracted. While texting. If you have been using any mind-altering substances or drugs. Wear a helmet and other protective equipment during sports activities. If you have firearms in your house, make sure you follow all gun safety procedures. Seek help if you have been physically or sexually abused. What's next? Visit your health care provider once a year for an annual wellness visit. Ask your health care provider how often you should have your eyes and teeth checked. Stay up to date on all vaccines. This information is not intended to replace advice given to you by your health care provider. Make sure you discuss any questions you have with your health care provider. Document Revised: 02/22/2021 Document Reviewed: 02/22/2021 Elsevier Patient Education  2024 [employer].
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
## 2023-05-08 NOTE — Assessment & Plan Note (Signed)
Advised to schedule f/up with hematology

## 2023-05-09 LAB — IRON,TIBC AND FERRITIN PANEL
%SAT: 5 % — ABNORMAL LOW (ref 16–45)
Ferritin: 3 ng/mL — ABNORMAL LOW (ref 16–232)
Iron: 23 ug/dL — ABNORMAL LOW (ref 40–190)
TIBC: 427 ug/dL (ref 250–450)

## 2023-05-09 MED ORDER — VITAMIN D (ERGOCALCIFEROL) 1.25 MG (50000 UNIT) PO CAPS: 50000.0000 [IU] | ORAL_CAPSULE | ORAL | 0 refills | Status: DC

## 2023-05-09 NOTE — Addendum Note (Signed)
Addended by: Michaela Corner on: 05/09/2023 03:51 PM   Modules accepted: Orders

## 2023-05-09 NOTE — Addendum Note (Signed)
Addended by: Michaela Corner on: 05/09/2023 03:24 PM   Modules accepted: Orders
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
## 2023-05-27 ENCOUNTER — Ambulatory Visit: Payer: BC Managed Care – PPO | Attending: Nurse Practitioner | Admitting: Physical Therapy

## 2023-05-27 DIAGNOSIS — R252 Cramp and spasm: Secondary | ICD-10-CM | POA: Insufficient documentation

## 2023-05-27 DIAGNOSIS — M5459 Other low back pain: Secondary | ICD-10-CM | POA: Insufficient documentation

## 2023-05-27 DIAGNOSIS — M545 Low back pain, unspecified: Secondary | ICD-10-CM | POA: Diagnosis not present

## 2023-05-27 DIAGNOSIS — G8929 Other chronic pain: Secondary | ICD-10-CM | POA: Insufficient documentation

## 2023-05-27 DIAGNOSIS — M6281 Muscle weakness (generalized): Secondary | ICD-10-CM | POA: Diagnosis not present

## 2023-05-27 DIAGNOSIS — M5136 Other intervertebral disc degeneration, lumbar region: Secondary | ICD-10-CM | POA: Insufficient documentation

## 2023-05-30 ENCOUNTER — Encounter: Payer: Self-pay | Admitting: Nurse Practitioner

## 2023-06-24 ENCOUNTER — Encounter: Payer: BC Managed Care – PPO | Admitting: Physical Therapy

## 2023-06-27 ENCOUNTER — Encounter: Payer: BC Managed Care – PPO | Admitting: Physical Therapy

## 2023-06-30 ENCOUNTER — Encounter (HOSPITAL_BASED_OUTPATIENT_CLINIC_OR_DEPARTMENT_OTHER): Payer: Self-pay | Admitting: Emergency Medicine

## 2023-06-30 ENCOUNTER — Other Ambulatory Visit (HOSPITAL_BASED_OUTPATIENT_CLINIC_OR_DEPARTMENT_OTHER): Payer: Self-pay

## 2023-06-30 ENCOUNTER — Emergency Department (HOSPITAL_BASED_OUTPATIENT_CLINIC_OR_DEPARTMENT_OTHER)
Admission: EM | Admit: 2023-06-30 | Discharge: 2023-06-30 | Disposition: A | Discharge status: Home / Self Care | Payer: BC Managed Care – PPO | Attending: Emergency Medicine | Admitting: Emergency Medicine

## 2023-06-30 ENCOUNTER — Emergency Department (HOSPITAL_BASED_OUTPATIENT_CLINIC_OR_DEPARTMENT_OTHER): Payer: BC Managed Care – PPO

## 2023-06-30 DIAGNOSIS — G44209 Tension-type headache, unspecified, not intractable: Secondary | ICD-10-CM | POA: Insufficient documentation

## 2023-06-30 DIAGNOSIS — Y92410 Unspecified street and highway as the place of occurrence of the external cause: Secondary | ICD-10-CM | POA: Diagnosis not present

## 2023-06-30 DIAGNOSIS — R519 Headache, unspecified: Secondary | ICD-10-CM | POA: Diagnosis not present

## 2023-06-30 MED ORDER — ACETAMINOPHEN 500 MG PO TABS
1000.0000 mg | ORAL_TABLET | Freq: Once | ORAL | Status: AC
  Administered 2023-06-30: 1000 mg via ORAL
  Filled 2023-06-30: qty 2

## 2023-06-30 MED ORDER — METHOCARBAMOL 500 MG PO TABS
1000.0000 mg | ORAL_TABLET | Freq: Every evening | ORAL | 0 refills | Status: DC | PRN
  Filled 2023-06-30: qty 14, 7d supply, fill #0

## 2023-06-30 MED ORDER — METHOCARBAMOL 500 MG PO TABS: 1000.0000 mg | ORAL_TABLET | Freq: Every evening | ORAL | 0 refills | Status: AC | PRN

## 2023-06-30 NOTE — ED Notes (Signed)
Patient transported to CT 
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
## 2023-07-01 ENCOUNTER — Encounter: Payer: BC Managed Care – PPO | Admitting: Physical Therapy

## 2023-07-04 ENCOUNTER — Encounter: Payer: BC Managed Care – PPO | Admitting: Physical Therapy

## 2023-07-08 ENCOUNTER — Encounter: Payer: BC Managed Care – PPO | Admitting: Physical Therapy

## 2023-07-31 ENCOUNTER — Telehealth: Payer: Self-pay | Admitting: Oncology

## 2023-07-31 NOTE — Telephone Encounter (Signed)
Scheduled appointment per staff message. Patient is aware of the made appointments. Patient was informed to notify our office at [phone] if she needs to reschedule.

## 2023-08-16 ENCOUNTER — Other Ambulatory Visit: Payer: Self-pay | Admitting: *Deleted

## 2023-08-16 DIAGNOSIS — D500 Iron deficiency anemia secondary to blood loss (chronic): Secondary | ICD-10-CM

## 2023-08-19 ENCOUNTER — Inpatient Hospital Stay: Payer: BC Managed Care – PPO | Attending: Hematology and Oncology

## 2023-08-19 ENCOUNTER — Encounter: Payer: Self-pay | Admitting: *Deleted

## 2023-08-19 ENCOUNTER — Encounter: Payer: Self-pay | Admitting: Oncology

## 2023-08-19 ENCOUNTER — Inpatient Hospital Stay (HOSPITAL_BASED_OUTPATIENT_CLINIC_OR_DEPARTMENT_OTHER): Payer: BC Managed Care – PPO | Admitting: Hematology and Oncology

## 2023-08-19 VITALS — BP 141/89 | HR 67 | Temp 98.1°F | Resp 20 | Wt 199.4 lb

## 2023-08-19 DIAGNOSIS — D509 Iron deficiency anemia, unspecified: Secondary | ICD-10-CM | POA: Insufficient documentation

## 2023-08-19 DIAGNOSIS — K5900 Constipation, unspecified: Secondary | ICD-10-CM | POA: Diagnosis not present

## 2023-08-19 DIAGNOSIS — Z8616 Personal history of COVID-19: Secondary | ICD-10-CM | POA: Insufficient documentation

## 2023-08-19 DIAGNOSIS — Z79899 Other long term (current) drug therapy: Secondary | ICD-10-CM | POA: Insufficient documentation

## 2023-08-19 DIAGNOSIS — D500 Iron deficiency anemia secondary to blood loss (chronic): Secondary | ICD-10-CM

## 2023-08-19 DIAGNOSIS — R5383 Other fatigue: Secondary | ICD-10-CM | POA: Diagnosis not present

## 2023-08-19 DIAGNOSIS — Z803 Family history of malignant neoplasm of breast: Secondary | ICD-10-CM | POA: Diagnosis not present

## 2023-08-19 DIAGNOSIS — H538 Other visual disturbances: Secondary | ICD-10-CM | POA: Insufficient documentation

## 2023-08-19 DIAGNOSIS — K529 Noninfective gastroenteritis and colitis, unspecified: Secondary | ICD-10-CM | POA: Diagnosis not present

## 2023-08-19 LAB — CBC WITH DIFFERENTIAL (CANCER CENTER ONLY)
Abs Immature Granulocytes: 0.02 10*3/uL (ref 0.00–0.07)
Basophils Absolute: 0.1 10*3/uL (ref 0.0–0.1)
Basophils Relative: 1 %
Eosinophils Absolute: 0.3 10*3/uL (ref 0.0–0.5)
Eosinophils Relative: 6 %
HCT: 30.8 % — ABNORMAL LOW (ref 36.0–46.0)
Hemoglobin: 9.6 g/dL — ABNORMAL LOW (ref 12.0–15.0)
Immature Granulocytes: 0 %
Lymphocytes Relative: 30 %
Lymphs Abs: 1.6 10*3/uL (ref 0.7–4.0)
MCH: 22.8 pg — ABNORMAL LOW (ref 26.0–34.0)
MCHC: 31.2 g/dL (ref 30.0–36.0)
MCV: 73.2 fL — ABNORMAL LOW (ref 80.0–100.0)
Monocytes Absolute: 0.5 10*3/uL (ref 0.1–1.0)
Monocytes Relative: 9 %
Neutro Abs: 2.9 10*3/uL (ref 1.7–7.7)
Neutrophils Relative %: 54 %
Platelet Count: 298 10*3/uL (ref 150–400)
RBC: 4.21 MIL/uL (ref 3.87–5.11)
RDW: 17.6 % — ABNORMAL HIGH (ref 11.5–15.5)
WBC Count: 5.2 10*3/uL (ref 4.0–10.5)
nRBC: 0 % (ref 0.0–0.2)

## 2023-08-19 LAB — CMP (CANCER CENTER ONLY)
ALT: 9 U/L (ref 0–44)
AST: 11 U/L — ABNORMAL LOW (ref 15–41)
Albumin: 4.1 g/dL (ref 3.5–5.0)
Alkaline Phosphatase: 123 U/L (ref 38–126)
Anion gap: 6 (ref 5–15)
BUN: 8 mg/dL (ref 6–20)
CO2: 28 mmol/L (ref 22–32)
Calcium: 9 mg/dL (ref 8.9–10.3)
Chloride: 105 mmol/L (ref 98–111)
Creatinine: 0.97 mg/dL (ref 0.44–1.00)
GFR, Estimated: >60 mL/min (ref >60)
Glucose, Bld: 95 mg/dL (ref 70–99)
Potassium: 3.5 mmol/L (ref 3.5–5.1)
Sodium: 139 mmol/L (ref 135–145)
Total Bilirubin: 0.2 mg/dL (ref <1.2)
Total Protein: 7.2 g/dL (ref 6.5–8.1)

## 2023-08-19 LAB — IRON AND IRON BINDING CAPACITY (CC-WL,HP ONLY)
Iron: 19 ug/dL — ABNORMAL LOW (ref 28–170)
Saturation Ratios: 4 % — ABNORMAL LOW (ref 10.4–31.8)
TIBC: 475 ug/dL — ABNORMAL HIGH (ref 250–450)
UIBC: 456 ug/dL — ABNORMAL HIGH (ref 148–442)

## 2023-08-19 LAB — FERRITIN: Ferritin: 5 ng/mL — ABNORMAL LOW (ref 11–307)

## 2023-08-19 NOTE — Progress Notes (Signed)
[facility] FOLLOW UP NOTE  Patient Care Team: Nche, Bonna Gains, NP as PCP - General (Internal Medicine) Magrinat, Valentino Hue, MD (Inactive) as Consulting Physician (Oncology)  CHIEF COMPLAINTS/PURPOSE OF CONSULTATION:  IDA  ASSESSMENT & PLAN:   Iron Deficiency Anemia Hemoglobin decreased to 9.6, likely secondary to heavy menstruation. Patient has a history of intolerance to oral iron supplementation due to constipation. -Order three doses of IV iron infusion., venofer -Consider endometrial ablation to address heavy menstruation, the likely source of iron loss. Discussed with pharmacy about addition of benadryl and solumedrol for pre meds.  Prediabetes Patient was previously prescribed Ozempic, but the pharmacy did not approve it due to high demand and lack of a formal diabetes diagnosis. -Consider reapplying for Ozempic prescription under prediabetes diagnosis.  Colitis Patient reports symptoms of diarrhea. -Recommend patient schedule an appointment with her gastroenterologist for management.  Follow-up in 6 months with repeat labs.   HISTORY OF PRESENTING ILLNESS:  Erika David 47 y.o. female is here because of IDA  Oncology History   No history exists.    Discussed the use of AI scribe software for clinical note transcription with the patient, who gave verbal consent to proceed.  History of Present Illness    The patient, with a history of iron deficiency anemia, colitis, and heavy menstruation, presents with blurry vision and fatigue. She reports that her primary care physician recently found her blood iron levels to be 'really low'. She has not been able to tolerate oral iron supplements due to constipation. The patient also reports being prediabetic and is on Ozempic. She has been experiencing heavy menstruation, which is suspected to be the cause of her recurrent iron deficiency. She has been advised to consider a hysterectomy or endometrial ablation to  manage the heavy menstruation.  All other systems were reviewed with the patient and are negative.  MEDICAL HISTORY:  Past Medical History:  Diagnosis Date   Abnormal Pap smear     LEEP?  LAST PAP 05/2009   Acute respiratory failure due to COVID-19 (HCC) 01/27/2020   Anemia 01/04/2012   Chlamydia    H/O candidiasis    H/O dysmenorrhea 03/13/02   H/O gonorrhea    treated   H/O menorrhagia 07/09/02   Infection 2006   GONORHRREA   Infection 2006   CHLAMYDIA   Infection    FREQ YEAST   Pelvic injury AS TEEN   SPORTS RELATED   Pelvic pain 07/11/10   Sterilization 01/04/2012   [facility] PP BTL Tubal papers signed    Trichomonas    Yeast infection     SURGICAL HISTORY: Past Surgical History:  Procedure Laterality Date   BIOPSY  11/12/2019   Procedure: BIOPSY;  Surgeon: Meryl Dare, MD;  Location: [facility];  Service: Endoscopy;;   CESAREAN SECTION  04/11/2012   Procedure: CESAREAN SECTION;  Surgeon: Purcell Nails, MD;  Location: [facility];  Service: Gynecology;  Laterality: N/A;  twins   COLONOSCOPY WITH PROPOFOL N/A 11/12/2019   Procedure: COLONOSCOPY WITH PROPOFOL;  Surgeon: Meryl Dare, MD;  Location: [facility];  Service: Endoscopy;  Laterality: N/A;   GYNECOLOGIC CRYOSURGERY     TUBAL LIGATION     WISDOM TOOTH EXTRACTION  2007    SOCIAL HISTORY: Social History   Socioeconomic History   Marital status: Married    Spouse name: Not on file   Number of children: 3   Years of education: 16   Highest education level: Not on  file  Occupational History   Occupation: CNA  Tobacco Use   Smoking status: Never   Smokeless tobacco: Never  Vaping Use   Vaping status: Never Used  Substance and Sexual Activity   Alcohol use: Yes    Comment: occasional alcohol   Drug use: No   Sexual activity: Yes    Birth control/protection: Surgical    Comment: btl  Other Topics Concern   Not on file  Social History Narrative   Not on file   Social Determinants of Health    Financial Resource Strain: Not on file  Food Insecurity: Not on file  Transportation Needs: Not on file  Physical Activity: Not on file  Stress: Not on file  Social Connections: Not on file  Intimate Partner Violence: Not on file    FAMILY HISTORY: Family History  Problem Relation Age of Onset   Hypertension Mother    Cancer Mother 43       BREAST   Thyroid disease Mother    Diabetes Father    Hypertension Sister    Liver disease Sister    Lupus Maternal Aunt    Alcohol abuse Maternal Aunt    Drug abuse Maternal Aunt    Cancer Maternal Grandmother 21       Stomach cancer   Hypertension Maternal Grandmother    Diabetes Maternal Grandmother    Heart disease Maternal Grandfather    Stroke Maternal Grandfather    Asthma Cousin    Anesthesia problems Neg Hx     ALLERGIES:  is allergic to bee pollen, dust mite extract, kiwi extract, pollen extract, other, soy allergy (do not select), and venofer [iron sucrose].  MEDICATIONS:  Current Outpatient Medications  Medication Sig Dispense Refill   albuterol (VENTOLIN HFA) 108 (90 Base) MCG/ACT inhaler Inhale 2 puffs into the lungs every 6 (six) hours as needed for wheezing or shortness of breath. 6.7 g 0   albuterol (VENTOLIN HFA) 108 (90 Base) MCG/ACT inhaler Inhale 2 puffs into the lungs every 6 (six) hours as needed for wheezing or shortness of breath. 8 g 0   Cetirizine HCl (ZYRTEC PO) Take by mouth.     cyclobenzaprine (FLEXERIL) 5 MG tablet Take 1-2 tablets (5-10 mg total) by mouth at bedtime. 14 tablet 0   Semaglutide-Weight Management (WEGOVY) 0.25 MG/0.5ML SOAJ Inject 0.25 mg into the skin once a week. 2 mL 0   Vitamin D, Ergocalciferol, (DRISDOL) 1.25 MG (50000 UNIT) CAPS capsule Take 1 capsule (50,000 Units total) by mouth every 7 (seven) days. 12 capsule 0   No current facility-administered medications for this visit.      PHYSICAL EXAMINATION: ECOG PERFORMANCE STATUS: 0 - Asymptomatic  Vitals:   08/19/23 1031  08/19/23 1032  BP: (!) 161/96 (!) 141/89  Pulse: 67   Resp: 20   Temp: 98.1 F (36.7 C)   SpO2: 100%    Filed Weights   08/19/23 1031  Weight: 199 lb 6.4 oz (90.4 kg)    GENERAL:alert, no distress and comfortable SKIN: skin color, texture, turgor are normal, no rashes or significant lesions EYES: normal, conjunctiva are pink and non-injected, sclera clear OROPHARYNX:no exudate, no erythema and lips, buccal mucosa, and tongue normal  NECK: supple, thyroid normal size, non-tender, without nodularity LYMPH:  no palpable lymphadenopathy in the cervical, axillary  LUNGS: clear to auscultation and percussion with normal breathing effort HEART: regular rate & rhythm and no murmurs and no lower extremity edema ABDOMEN:abdomen soft, non-tender and normal bowel sounds Musculoskeletal:no cyanosis  of digits and no clubbing  PSYCH: alert & oriented x 3 with fluent speech NEURO: no focal motor/sensory deficits  LABORATORY DATA:  I have reviewed the data as listed Lab Results  Component Value Date   WBC 5.2 08/19/2023   HGB 9.6 (L) 08/19/2023   HCT 30.8 (L) 08/19/2023   MCV 73.2 (L) 08/19/2023   PLT 298 08/19/2023     Chemistry      Component Value Date/Time   NA 139 08/19/2023 1012   K 3.5 08/19/2023 1012   CL 105 08/19/2023 1012   CO2 28 08/19/2023 1012   BUN 8 08/19/2023 1012   CREATININE 0.97 08/19/2023 1012   CREATININE 0.89 05/29/2013 1403      Component Value Date/Time   CALCIUM 9.0 08/19/2023 1012   ALKPHOS 123 08/19/2023 1012   AST 11 (L) 08/19/2023 1012   ALT 9 08/19/2023 1012   BILITOT 0.2 08/19/2023 1012       RADIOGRAPHIC STUDIES: I have personally reviewed the radiological images as listed and agreed with the findings in the report. No results found.  All questions were answered. The patient knows to call the clinic with any problems, questions or concerns.     Rachel Moulds, MD 08/19/2023 10:50 AM

## 2023-08-22 ENCOUNTER — Other Ambulatory Visit: Payer: Self-pay | Admitting: Hematology and Oncology

## 2023-08-29 ENCOUNTER — Encounter: Payer: Self-pay | Admitting: Hematology and Oncology

## 2023-08-29 ENCOUNTER — Inpatient Hospital Stay: Payer: BC Managed Care – PPO

## 2023-08-29 VITALS — BP 141/83 | HR 94 | Temp 98.4°F | Resp 20 | Wt 199.2 lb

## 2023-08-29 DIAGNOSIS — N921 Excessive and frequent menstruation with irregular cycle: Secondary | ICD-10-CM

## 2023-08-29 DIAGNOSIS — D509 Iron deficiency anemia, unspecified: Secondary | ICD-10-CM | POA: Diagnosis not present

## 2023-08-29 MED ORDER — METHYLPREDNISOLONE SODIUM SUCC 125 MG IJ SOLR
125.0000 mg | Freq: Once | INTRAMUSCULAR | Status: AC
  Administered 2023-08-29: 125 mg via INTRAVENOUS
  Filled 2023-08-29: qty 2

## 2023-08-29 MED ORDER — SODIUM CHLORIDE 0.9 % IV SOLN
300.0000 mg | Freq: Once | INTRAVENOUS | Status: AC
  Administered 2023-08-29: 300 mg via INTRAVENOUS
  Filled 2023-08-29: qty 300

## 2023-08-29 MED ORDER — DIPHENHYDRAMINE HCL 50 MG/ML IJ SOLN
25.0000 mg | Freq: Once | INTRAMUSCULAR | Status: AC
  Administered 2023-08-29: 25 mg via INTRAVENOUS
  Filled 2023-08-29: qty 1

## 2023-08-29 MED ORDER — SODIUM CHLORIDE 0.9 % IV SOLN: Freq: Once | INTRAVENOUS | Status: AC

## 2023-08-29 MED ORDER — SODIUM CHLORIDE 0.9% FLUSH
10.0000 mL | Freq: Two times a day (BID) | INTRAVENOUS | Status: DC
Start: 2023-08-29 — End: 2023-08-29

## 2023-08-29 NOTE — Patient Instructions (Signed)
Iron Sucrose Injection What is this medication? IRON SUCROSE (EYE ern SOO krose) treats low levels of iron (iron deficiency anemia) in people with kidney disease. Iron is a mineral that plays an important role in making red blood cells, which carry oxygen from your lungs to the rest of your body. This medicine may be used for other purposes; ask your health care provider or pharmacist if you have questions. COMMON BRAND NAME(S): Venofer What should I tell my care team before I take this medication? They need to know if you have any of these conditions: Anemia not caused by low iron levels Heart disease High levels of iron in the blood Kidney disease Liver disease An unusual or allergic reaction to iron, other medications, foods, dyes, or preservatives Pregnant or trying to get pregnant Breastfeeding How should I use this medication? This medication is for infusion into a vein. It is given in a hospital or clinic setting. Talk to your care team about the use of this medication in children. While this medication may be prescribed for children as young as 2 years for selected conditions, precautions do apply. Overdosage: If you think you have taken too much of this medicine contact a poison control center or emergency room at once. NOTE: This medicine is only for you. Do not share this medicine with others. What if I miss a dose? Keep appointments for follow-up doses. It is important not to miss your dose. Call your care team if you are unable to keep an appointment. What may interact with this medication? Do not take this medication with any of the following: Deferoxamine Dimercaprol Other iron products This medication may also interact with the following: Chloramphenicol Deferasirox This list may not describe all possible interactions. Give your health care provider a list of all the medicines, herbs, non-prescription drugs, or dietary supplements you use. Also tell them if you smoke,  drink alcohol, or use illegal drugs. Some items may interact with your medicine. What should I watch for while using this medication? Visit your care team regularly. Tell your care team if your symptoms do not start to get better or if they get worse. You may need blood work done while you are taking this medication. You may need to follow a special diet. Talk to your care team. Foods that contain iron include: whole grains/cereals, dried fruits, beans, or peas, leafy green vegetables, and organ meats (liver, kidney). What side effects may I notice from receiving this medication? Side effects that you should report to your care team as soon as possible: Allergic reactions--skin rash, itching, hives, swelling of the face, lips, tongue, or throat Low blood pressure--dizziness, feeling faint or lightheaded, blurry vision Shortness of breath Side effects that usually do not require medical attention (report to your care team if they continue or are bothersome): Flushing Headache Joint pain Muscle pain Nausea Pain, redness, or irritation at injection site This list may not describe all possible side effects. Call your doctor for medical advice about side effects. You may report side effects to FDA at 1-800-FDA-1088. Where should I keep my medication? This medication is given in a hospital or clinic. It will not be stored at home. NOTE: This sheet is a summary. It may not cover all possible information. If you have questions about this medicine, talk to your doctor, pharmacist, or health care provider.  2024 Elsevier/Gold Standard (2023-02-01 00:00:00)

## 2023-09-05 ENCOUNTER — Inpatient Hospital Stay: Payer: BC Managed Care – PPO

## 2023-09-05 VITALS — BP 142/78 | HR 93 | Temp 98.4°F | Resp 16

## 2023-09-05 DIAGNOSIS — N921 Excessive and frequent menstruation with irregular cycle: Secondary | ICD-10-CM

## 2023-09-05 DIAGNOSIS — D509 Iron deficiency anemia, unspecified: Secondary | ICD-10-CM | POA: Diagnosis not present

## 2023-09-05 MED ORDER — IRON SUCROSE 20 MG/ML IV SOLN
300.0000 mg | Freq: Once | INTRAVENOUS | Status: AC
  Administered 2023-09-05: 300 mg via INTRAVENOUS
  Filled 2023-09-05: qty 300

## 2023-09-05 MED ORDER — DIPHENHYDRAMINE HCL 50 MG/ML IJ SOLN
25.0000 mg | Freq: Once | INTRAMUSCULAR | Status: AC
  Administered 2023-09-05: 25 mg via INTRAVENOUS
  Filled 2023-09-05: qty 1

## 2023-09-05 MED ORDER — METHYLPREDNISOLONE SODIUM SUCC 125 MG IJ SOLR
125.0000 mg | Freq: Once | INTRAMUSCULAR | Status: AC
Start: 2023-09-05 — End: 2023-09-05
  Administered 2023-09-05: 125 mg via INTRAVENOUS
  Filled 2023-09-05: qty 2

## 2023-09-05 MED ORDER — SODIUM CHLORIDE 0.9 % IV SOLN: INTRAVENOUS | Status: DC

## 2023-09-05 MED ORDER — SODIUM CHLORIDE 0.9% FLUSH: 10.0000 mL | Freq: Two times a day (BID) | INTRAVENOUS | Status: DC

## 2023-09-05 NOTE — Patient Instructions (Signed)
Iron Sucrose Injection What is this medication? IRON SUCROSE (EYE ern SOO krose) treats low levels of iron (iron deficiency anemia) in people with kidney disease. Iron is a mineral that plays an important role in making red blood cells, which carry oxygen from your lungs to the rest of your body. This medicine may be used for other purposes; ask your health care provider or pharmacist if you have questions. COMMON BRAND NAME(S): Venofer What should I tell my care team before I take this medication? They need to know if you have any of these conditions: Anemia not caused by low iron levels Heart disease High levels of iron in the blood Kidney disease Liver disease An unusual or allergic reaction to iron, other medications, foods, dyes, or preservatives Pregnant or trying to get pregnant Breastfeeding How should I use this medication? This medication is for infusion into a vein. It is given in a hospital or clinic setting. Talk to your care team about the use of this medication in children. While this medication may be prescribed for children as young as 2 years for selected conditions, precautions do apply. Overdosage: If you think you have taken too much of this medicine contact a poison control center or emergency room at once. NOTE: This medicine is only for you. Do not share this medicine with others. What if I miss a dose? Keep appointments for follow-up doses. It is important not to miss your dose. Call your care team if you are unable to keep an appointment. What may interact with this medication? Do not take this medication with any of the following: Deferoxamine Dimercaprol Other iron products This medication may also interact with the following: Chloramphenicol Deferasirox This list may not describe all possible interactions. Give your health care provider a list of all the medicines, herbs, non-prescription drugs, or dietary supplements you use. Also tell them if you smoke,  drink alcohol, or use illegal drugs. Some items may interact with your medicine. What should I watch for while using this medication? Visit your care team regularly. Tell your care team if your symptoms do not start to get better or if they get worse. You may need blood work done while you are taking this medication. You may need to follow a special diet. Talk to your care team. Foods that contain iron include: whole grains/cereals, dried fruits, beans, or peas, leafy green vegetables, and organ meats (liver, kidney). What side effects may I notice from receiving this medication? Side effects that you should report to your care team as soon as possible: Allergic reactions--skin rash, itching, hives, swelling of the face, lips, tongue, or throat Low blood pressure--dizziness, feeling faint or lightheaded, blurry vision Shortness of breath Side effects that usually do not require medical attention (report to your care team if they continue or are bothersome): Flushing Headache Joint pain Muscle pain Nausea Pain, redness, or irritation at injection site This list may not describe all possible side effects. Call your doctor for medical advice about side effects. You may report side effects to FDA at 1-800-FDA-1088. Where should I keep my medication? This medication is given in a hospital or clinic. It will not be stored at home. NOTE: This sheet is a summary. It may not cover all possible information. If you have questions about this medicine, talk to your doctor, pharmacist, or health care provider.  2024 Elsevier/Gold Standard (2023-02-01 00:00:00)

## 2023-09-05 NOTE — Progress Notes (Signed)
Pt observed for 30 min post venofer infusion. Tolerated well. No adverse s/s. VSS. Pt ambulatory at time of d/c. AVS reviewed.

## 2023-09-19 ENCOUNTER — Inpatient Hospital Stay: Payer: 59

## 2023-09-20 ENCOUNTER — Inpatient Hospital Stay: Payer: 59

## 2023-09-20 ENCOUNTER — Encounter: Payer: Self-pay | Admitting: Hematology and Oncology

## 2023-09-20 ENCOUNTER — Inpatient Hospital Stay: Payer: 59 | Attending: Hematology and Oncology

## 2023-09-20 VITALS — BP 104/66 | HR 85 | Temp 97.9°F | Resp 18

## 2023-09-20 DIAGNOSIS — D500 Iron deficiency anemia secondary to blood loss (chronic): Secondary | ICD-10-CM

## 2023-09-20 DIAGNOSIS — D509 Iron deficiency anemia, unspecified: Secondary | ICD-10-CM | POA: Insufficient documentation

## 2023-09-20 DIAGNOSIS — Z79899 Other long term (current) drug therapy: Secondary | ICD-10-CM | POA: Diagnosis not present

## 2023-09-20 DIAGNOSIS — N921 Excessive and frequent menstruation with irregular cycle: Secondary | ICD-10-CM

## 2023-09-20 MED ORDER — SODIUM CHLORIDE 0.9 % IV SOLN
300.0000 mg | Freq: Once | INTRAVENOUS | Status: AC
  Administered 2023-09-20: 300 mg via INTRAVENOUS
  Filled 2023-09-20: qty 300

## 2023-09-20 MED ORDER — METHYLPREDNISOLONE SODIUM SUCC 125 MG IJ SOLR
125.0000 mg | Freq: Once | INTRAMUSCULAR | Status: AC
Start: 2023-09-20 — End: 2023-09-20
  Administered 2023-09-20: 125 mg via INTRAVENOUS
  Filled 2023-09-20: qty 2

## 2023-09-20 MED ORDER — DIPHENHYDRAMINE HCL 50 MG/ML IJ SOLN
25.0000 mg | Freq: Once | INTRAMUSCULAR | Status: AC
Start: 2023-09-20 — End: 2023-09-20
  Administered 2023-09-20: 25 mg via INTRAVENOUS
  Filled 2023-09-20: qty 1

## 2023-09-20 MED ORDER — SODIUM CHLORIDE 0.9 % IV SOLN: Freq: Once | INTRAVENOUS | Status: AC

## 2023-09-20 NOTE — Patient Instructions (Signed)
 Iron Sucrose Injection What is this medication? IRON SUCROSE (EYE ern SOO krose) treats low levels of iron (iron deficiency anemia) in people with kidney disease. Iron is a mineral that plays an important role in making red blood cells, which carry oxygen from your lungs to the rest of your body. This medicine may be used for other purposes; ask your health care provider or pharmacist if you have questions. COMMON BRAND NAME(S): Venofer What should I tell my care team before I take this medication? They need to know if you have any of these conditions: Anemia not caused by low iron levels Heart disease High levels of iron in the blood Kidney disease Liver disease An unusual or allergic reaction to iron, other medications, foods, dyes, or preservatives Pregnant or trying to get pregnant Breastfeeding How should I use this medication? This medication is for infusion into a vein. It is given in a hospital or clinic setting. Talk to your care team about the use of this medication in children. While this medication may be prescribed for children as young as 2 years for selected conditions, precautions do apply. Overdosage: If you think you have taken too much of this medicine contact a poison control center or emergency room at once. NOTE: This medicine is only for you. Do not share this medicine with others. What if I miss a dose? Keep appointments for follow-up doses. It is important not to miss your dose. Call your care team if you are unable to keep an appointment. What may interact with this medication? Do not take this medication with any of the following: Deferoxamine Dimercaprol Other iron products This medication may also interact with the following: Chloramphenicol Deferasirox This list may not describe all possible interactions. Give your health care provider a list of all the medicines, herbs, non-prescription drugs, or dietary supplements you use. Also tell them if you smoke,  drink alcohol, or use illegal drugs. Some items may interact with your medicine. What should I watch for while using this medication? Visit your care team regularly. Tell your care team if your symptoms do not start to get better or if they get worse. You may need blood work done while you are taking this medication. You may need to follow a special diet. Talk to your care team. Foods that contain iron include: whole grains/cereals, dried fruits, beans, or peas, leafy green vegetables, and organ meats (liver, kidney). What side effects may I notice from receiving this medication? Side effects that you should report to your care team as soon as possible: Allergic reactions--skin rash, itching, hives, swelling of the face, lips, tongue, or throat Low blood pressure--dizziness, feeling faint or lightheaded, blurry vision Shortness of breath Side effects that usually do not require medical attention (report to your care team if they continue or are bothersome): Flushing Headache Joint pain Muscle pain Nausea Pain, redness, or irritation at injection site This list may not describe all possible side effects. Call your doctor for medical advice about side effects. You may report side effects to FDA at 1-800-FDA-1088. Where should I keep my medication? This medication is given in a hospital or clinic. It will not be stored at home. NOTE: This sheet is a summary. It may not cover all possible information. If you have questions about this medicine, talk to your doctor, pharmacist, or health care provider.  2024 Elsevier/Gold Standard (2023-02-01 00:00:00)

## 2023-10-11 ENCOUNTER — Emergency Department (HOSPITAL_BASED_OUTPATIENT_CLINIC_OR_DEPARTMENT_OTHER)
Admission: EM | Admit: 2023-10-11 | Discharge: 2023-10-11 | Disposition: A | Discharge status: Home / Self Care | Payer: 59

## 2023-10-11 ENCOUNTER — Ambulatory Visit
Admission: RE | Admit: 2023-10-11 | Discharge: 2023-10-11 | Disposition: A | Discharge status: Home / Self Care | Payer: 59 | Source: Ambulatory Visit | Attending: Nurse Practitioner

## 2023-10-11 ENCOUNTER — Other Ambulatory Visit: Payer: Self-pay

## 2023-10-11 ENCOUNTER — Encounter (HOSPITAL_BASED_OUTPATIENT_CLINIC_OR_DEPARTMENT_OTHER): Payer: Self-pay

## 2023-10-11 ENCOUNTER — Emergency Department (HOSPITAL_BASED_OUTPATIENT_CLINIC_OR_DEPARTMENT_OTHER): Payer: 59 | Admitting: Radiology

## 2023-10-11 DIAGNOSIS — Z0001 Encounter for general adult medical examination with abnormal findings: Secondary | ICD-10-CM

## 2023-10-11 DIAGNOSIS — K2970 Gastritis, unspecified, without bleeding: Secondary | ICD-10-CM | POA: Insufficient documentation

## 2023-10-11 DIAGNOSIS — Z20822 Contact with and (suspected) exposure to covid-19: Secondary | ICD-10-CM | POA: Insufficient documentation

## 2023-10-11 DIAGNOSIS — R079 Chest pain, unspecified: Secondary | ICD-10-CM | POA: Diagnosis present

## 2023-10-11 DIAGNOSIS — Z1231 Encounter for screening mammogram for malignant neoplasm of breast: Secondary | ICD-10-CM

## 2023-10-11 DIAGNOSIS — R0789 Other chest pain: Secondary | ICD-10-CM

## 2023-10-11 LAB — CBC
HCT: 36.8 % (ref 36.0–46.0)
Hemoglobin: 12 g/dL (ref 12.0–15.0)
MCH: 26 pg (ref 26.0–34.0)
MCHC: 32.6 g/dL (ref 30.0–36.0)
MCV: 79.7 fL — ABNORMAL LOW (ref 80.0–100.0)
Platelets: 224 10*3/uL (ref 150–400)
RBC: 4.62 MIL/uL (ref 3.87–5.11)
RDW: 23.7 % — ABNORMAL HIGH (ref 11.5–15.5)
WBC: 5.1 10*3/uL (ref 4.0–10.5)
nRBC: 0 % (ref 0.0–0.2)

## 2023-10-11 LAB — BASIC METABOLIC PANEL
Anion gap: 8 (ref 5–15)
BUN: 7 mg/dL (ref 6–20)
CO2: 24 mmol/L (ref 22–32)
Calcium: 9 mg/dL (ref 8.9–10.3)
Chloride: 107 mmol/L (ref 98–111)
Creatinine, Ser: 0.78 mg/dL (ref 0.44–1.00)
GFR, Estimated: >60 mL/min (ref >60)
Glucose, Bld: 85 mg/dL (ref 70–99)
Potassium: 3.5 mmol/L (ref 3.5–5.1)
Sodium: 139 mmol/L (ref 135–145)

## 2023-10-11 LAB — TROPONIN I (HIGH SENSITIVITY)
Troponin I (High Sensitivity): 2 ng/L (ref <18)
Troponin I (High Sensitivity): 2 ng/L (ref <18)

## 2023-10-11 LAB — RESP PANEL BY RT-PCR (RSV, FLU A&B, COVID)  RVPGX2
Influenza A by PCR: NEGATIVE
Influenza B by PCR: NEGATIVE
Resp Syncytial Virus by PCR: NEGATIVE
SARS Coronavirus 2 by RT PCR: NEGATIVE

## 2023-10-11 LAB — PREGNANCY, URINE: Preg Test, Ur: NEGATIVE

## 2023-10-11 MED ORDER — OMEPRAZOLE 20 MG PO CPDR: 20.0000 mg | DELAYED_RELEASE_CAPSULE | Freq: Every day | ORAL | 0 refills | Status: AC

## 2023-10-11 MED ORDER — ALUM & MAG HYDROXIDE-SIMETH 200-200-20 MG/5ML PO SUSP
30.0000 mL | Freq: Once | ORAL | Status: AC
  Administered 2023-10-11: 30 mL via ORAL
  Filled 2023-10-11: qty 30

## 2023-10-11 MED ORDER — LIDOCAINE VISCOUS HCL 2 % MT SOLN
15.0000 mL | Freq: Once | OROMUCOSAL | Status: AC
  Administered 2023-10-11: 15 mL via ORAL
  Filled 2023-10-11: qty 15

## 2023-10-11 MED ORDER — FAMOTIDINE 20 MG PO TABS
20.0000 mg | ORAL_TABLET | Freq: Once | ORAL | Status: AC
  Administered 2023-10-11: 20 mg via ORAL
  Filled 2023-10-11: qty 1

## 2023-10-11 NOTE — ED Provider Notes (Signed)
 [place] EMERGENCY DEPARTMENT AT [facility] Provider Note   CSN: [id number] Arrival date & time: 10/11/23  1217     History Chief Complaint  Patient presents with   Chest Pain    Erika David is a 48 y.o. female. Patient presents to the ED with concerns of chest pain. History of iron deficiency anemia, obesity, and chronic back pain. States that she has been having three days of mid chest pain that comes and goes. No significant improvement with anything at home. Denies any diaphoresis, nausea, or vomiting. Some mild shortness of breath due to pain with deep inhalation. Not on estrogen containing medications, hemoptysis, leg swelling, recent surgery, or prior PE/DVT.   Chest Pain      Home Medications Prior to Admission medications   Medication Sig Start Date End Date Taking? Authorizing Provider  omeprazole (PRILOSEC) 20 MG capsule Take 1 capsule (20 mg total) by mouth daily. 10/11/23  Yes Smitty Knudsen, PA-C  albuterol (VENTOLIN HFA) 108 (90 Base) MCG/ACT inhaler Inhale 2 puffs into the lungs every 6 (six) hours as needed for wheezing or shortness of breath. 01/22/20   Placido Sou, PA-C  albuterol (VENTOLIN HFA) 108 (90 Base) MCG/ACT inhaler Inhale 2 puffs into the lungs every 6 (six) hours as needed for wheezing or shortness of breath. 10/18/22   Viviano Simas, FNP  Cetirizine HCl (ZYRTEC PO) Take by mouth.    [provider]  cyclobenzaprine (FLEXERIL) 5 MG tablet Take 1-2 tablets (5-10 mg total) by mouth at bedtime. 04/18/23   Nche, Bonna Gains, NP  Semaglutide-Weight Management (WEGOVY) 0.25 MG/0.5ML SOAJ Inject 0.25 mg into the skin once a week. 05/08/23   Nche, Bonna Gains, NP  Vitamin D, Ergocalciferol, (DRISDOL) 1.25 MG (50000 UNIT) CAPS capsule Take 1 capsule (50,000 Units total) by mouth every 7 (seven) days. 05/09/23   Nche, Bonna Gains, NP      Allergies    Bee pollen, Dust mite extract, Kiwi extract, Pollen extract, Other, Soy allergy (do  not select), and Venofer [iron sucrose]    Review of Systems   Review of Systems  Cardiovascular:  Positive for chest pain.  All other systems reviewed and are negative.   Physical Exam Updated Vital Signs BP 128/89   Pulse 75   Temp 98.2 F (36.8 C)   Resp 18   Ht 5\' 1"  (1.549 m)   Wt 88 kg   LMP 09/30/2023   SpO2 100%   BMI 36.66 kg/m  Physical Exam Vitals and nursing note reviewed.  Constitutional:      General: She is not in acute distress.    Appearance: She is well-developed.  HENT:     Head: Normocephalic and atraumatic.  Eyes:     Conjunctiva/sclera: Conjunctivae normal.  Cardiovascular:     Rate and Rhythm: Normal rate and regular rhythm.     Heart sounds: No murmur heard. Pulmonary:     Effort: Pulmonary effort is normal. No respiratory distress.     Breath sounds: Normal breath sounds. No decreased breath sounds, wheezing or rhonchi.  Abdominal:     Palpations: Abdomen is soft.     Tenderness: There is no abdominal tenderness.  Musculoskeletal:        General: No swelling.     Cervical back: Neck supple.  Skin:    General: Skin is warm and dry.     Capillary Refill: Capillary refill takes less than 2 seconds.  Neurological:     Mental Status: She  is alert.  Psychiatric:        Mood and Affect: Mood normal.     ED Results / Procedures / Treatments   Labs (all labs ordered are listed, but only abnormal results are displayed) Labs Reviewed  CBC - Abnormal; Notable for the following components:      Result Value   MCV 79.7 (*)    RDW 23.7 (*)    All other components within normal limits  RESP PANEL BY RT-PCR (RSV, FLU A&B, COVID)  [id number]  BASIC METABOLIC PANEL  PREGNANCY, URINE  TROPONIN I (HIGH SENSITIVITY)  TROPONIN I (HIGH SENSITIVITY)    EKG EKG Interpretation Date/Time:  Friday October 11 2023 12:31:33 EST Ventricular Rate:  73 PR Interval:  154 QRS Duration:  68 QT Interval:  370 QTC Calculation: 407 R Axis:   54  Text  Interpretation: Normal sinus rhythm Right atrial enlargement Borderline ECG When compared with ECG of 11-Apr-2023 16:03, PREVIOUS ECG IS PRESENT Confirmed by Beckey Downing [phone]) on 10/11/2023 12:34:53 PM  Radiology DG Chest 2 View Result Date: 10/11/2023 CLINICAL DATA:  Chest pain for 3 days. EXAM: CHEST - 2 VIEW COMPARISON:  February 09, 2020. FINDINGS: The heart size and mediastinal contours are within normal limits. Both lungs are clear. The visualized skeletal structures are unremarkable. IMPRESSION: No active cardiopulmonary disease. Electronically Signed   By: Lupita Raider M.D.   On: 10/11/2023 13:41    Procedures Procedures    Medications Ordered in ED Medications  alum & mag hydroxide-simeth (MAALOX/MYLANTA) 200-200-20 MG/5ML suspension 30 mL (30 mLs Oral Given 10/11/23 1459)    And  lidocaine (XYLOCAINE) 2 % viscous mouth solution 15 mL (15 mLs Oral Given 10/11/23 1459)  famotidine (PEPCID) tablet 20 mg (20 mg Oral Given 10/11/23 1458)    ED Course/ Medical Decision Making/ A&P                                 Medical Decision Making Amount and/or Complexity of Data Reviewed Labs: ordered. Radiology: ordered.  Risk OTC drugs. Prescription drug management.   This patient presents to the ED for concern of chest pain. Differential diagnosis includes ACS, PE, pneumonia, reflux, gastritis, esophageal spasm   Lab Tests:  I Ordered, and personally interpreted labs.  The pertinent results include:  CBC and BMP unremarkable, troponin negative at 2 with delta at 2, urine pregnancy negative, respiratory panel negative for COVID, influenza, and RSV   Imaging Studies ordered:  I ordered imaging studies including chest xray  I independently visualized and interpreted imaging which showed no acute cardiopulmonary process seen I agree with the radiologist interpretation   Medicines ordered and prescription drug management:  I ordered medication including Maalox, viscous lidocaine,  famotidine for suspected reflux  Reevaluation of the patient after these medicines showed that the patient improved I have reviewed the patients home medicines and have made adjustments as needed   Problem List / ED Course:  Patient presents to the ED with concerns of chest pain. States that this started about 3 days ago and has not resolved. Pain is central without radiation. No nausea, vomiting, or diaphoresis. Some pain with deep inhalation. Does endorse some history of unmanaged anxiety and is unsure if she is having worsening symptoms due to this. Exam is unremarkable. Chest and lung sounds unremarkable. Abdomen is soft and non-tender. Vitals stable. Will trial GI cocktail as this may be reflux mediated, but  unclear picture at this time. Labs, EKG, and CXR are unremarkable. More likely to be reflux vs anxiety. On reassessment, patient had improved.   Suspect patient likely has poorly controlled reflux and discussed home trial of PPI and close PCP follow for further evaluation. Discussed return precautions. Patient discharged home in stable condition.  Final Clinical Impression(s) / ED Diagnoses Final diagnoses:  Gastritis without bleeding, unspecified chronicity, unspecified gastritis type  Other chest pain    Rx / DC Orders ED Discharge Orders          Ordered    omeprazole (PRILOSEC) 20 MG capsule  Daily        10/11/23 1635              Smitty Knudsen, PA-C 10/11/23 1636    Durwin Glaze, MD 10/12/23 [phone]

## 2023-10-11 NOTE — ED Triage Notes (Signed)
Onset three days of chest pain.  Mid chest.  Comes and comes.  Having pain at present.  Some shortness of breath yesterday.

## 2023-10-11 NOTE — Discharge Instructions (Addendum)
You were seen today for concerns of chest pain. Your labs and imaging were thankfully reassuring and there are no findings to suggest this is a cardiac problem. This may be reflux related as you had some improvement with a GI cocktail. I would suggest taking a PPI medication such as omeprazole to help manage this. For any new or worsening symptoms, return to the ER.

## 2024-02-17 ENCOUNTER — Telehealth: Payer: Self-pay

## 2024-02-17 ENCOUNTER — Other Ambulatory Visit: Payer: Self-pay | Admitting: *Deleted

## 2024-02-17 DIAGNOSIS — D500 Iron deficiency anemia secondary to blood loss (chronic): Secondary | ICD-10-CM

## 2024-02-17 NOTE — Telephone Encounter (Signed)
 Verbally confirmed appt for 6/10

## 2024-02-18 ENCOUNTER — Inpatient Hospital Stay: Payer: BC Managed Care – PPO | Admitting: Hematology and Oncology

## 2024-02-18 ENCOUNTER — Other Ambulatory Visit: Payer: Self-pay

## 2024-02-18 ENCOUNTER — Inpatient Hospital Stay: Payer: BC Managed Care – PPO | Attending: Hematology and Oncology

## 2024-02-18 VITALS — BP 142/96 | HR 80 | Temp 98.3°F | Resp 17 | Wt 201.3 lb

## 2024-02-18 DIAGNOSIS — Z79899 Other long term (current) drug therapy: Secondary | ICD-10-CM | POA: Insufficient documentation

## 2024-02-18 DIAGNOSIS — Z803 Family history of malignant neoplasm of breast: Secondary | ICD-10-CM | POA: Insufficient documentation

## 2024-02-18 DIAGNOSIS — D509 Iron deficiency anemia, unspecified: Secondary | ICD-10-CM | POA: Diagnosis present

## 2024-02-18 DIAGNOSIS — Z8616 Personal history of COVID-19: Secondary | ICD-10-CM | POA: Diagnosis not present

## 2024-02-18 DIAGNOSIS — U071 COVID-19: Secondary | ICD-10-CM

## 2024-02-18 DIAGNOSIS — Z9851 Tubal ligation status: Secondary | ICD-10-CM | POA: Diagnosis not present

## 2024-02-18 DIAGNOSIS — E669 Obesity, unspecified: Secondary | ICD-10-CM | POA: Insufficient documentation

## 2024-02-18 DIAGNOSIS — G479 Sleep disorder, unspecified: Secondary | ICD-10-CM | POA: Insufficient documentation

## 2024-02-18 DIAGNOSIS — D500 Iron deficiency anemia secondary to blood loss (chronic): Secondary | ICD-10-CM

## 2024-02-18 DIAGNOSIS — M129 Arthropathy, unspecified: Secondary | ICD-10-CM | POA: Diagnosis not present

## 2024-02-18 DIAGNOSIS — R0602 Shortness of breath: Secondary | ICD-10-CM | POA: Diagnosis not present

## 2024-02-18 DIAGNOSIS — E559 Vitamin D deficiency, unspecified: Secondary | ICD-10-CM | POA: Insufficient documentation

## 2024-02-18 DIAGNOSIS — N920 Excessive and frequent menstruation with regular cycle: Secondary | ICD-10-CM | POA: Diagnosis not present

## 2024-02-18 LAB — CBC WITH DIFFERENTIAL (CANCER CENTER ONLY)
Abs Immature Granulocytes: 0.01 10*3/uL (ref 0.00–0.07)
Basophils Absolute: 0.1 10*3/uL (ref 0.0–0.1)
Basophils Relative: 1 %
Eosinophils Absolute: 0.3 10*3/uL (ref 0.0–0.5)
Eosinophils Relative: 6 %
HCT: 32.7 % — ABNORMAL LOW (ref 36.0–46.0)
Hemoglobin: 11.4 g/dL — ABNORMAL LOW (ref 12.0–15.0)
Immature Granulocytes: 0 %
Lymphocytes Relative: 33 %
Lymphs Abs: 1.6 10*3/uL (ref 0.7–4.0)
MCH: 28.5 pg (ref 26.0–34.0)
MCHC: 34.9 g/dL (ref 30.0–36.0)
MCV: 81.8 fL (ref 80.0–100.0)
Monocytes Absolute: 0.4 10*3/uL (ref 0.1–1.0)
Monocytes Relative: 8 %
Neutro Abs: 2.5 10*3/uL (ref 1.7–7.7)
Neutrophils Relative %: 52 %
Platelet Count: 275 10*3/uL (ref 150–400)
RBC: 4 MIL/uL (ref 3.87–5.11)
RDW: 13.1 % (ref 11.5–15.5)
WBC Count: 4.8 10*3/uL (ref 4.0–10.5)
nRBC: 0 % (ref 0.0–0.2)

## 2024-02-18 LAB — CMP (CANCER CENTER ONLY)
ALT: 8 U/L (ref 0–44)
AST: 10 U/L — ABNORMAL LOW (ref 15–41)
Albumin: 4.3 g/dL (ref 3.5–5.0)
Alkaline Phosphatase: 105 U/L (ref 38–126)
Anion gap: 6 (ref 5–15)
BUN: 11 mg/dL (ref 6–20)
CO2: 26 mmol/L (ref 22–32)
Calcium: 9 mg/dL (ref 8.9–10.3)
Chloride: 108 mmol/L (ref 98–111)
Creatinine: 0.88 mg/dL (ref 0.44–1.00)
GFR, Estimated: >60 mL/min (ref >60)
Glucose, Bld: 93 mg/dL (ref 70–99)
Potassium: 3.6 mmol/L (ref 3.5–5.1)
Sodium: 140 mmol/L (ref 135–145)
Total Bilirubin: 0.3 mg/dL (ref 0.0–1.2)
Total Protein: 7.2 g/dL (ref 6.5–8.1)

## 2024-02-18 LAB — IRON AND IRON BINDING CAPACITY (CC-WL,HP ONLY)
Iron: 37 ug/dL (ref 28–170)
Saturation Ratios: 9 % — ABNORMAL LOW (ref 10.4–31.8)
TIBC: 403 ug/dL (ref 250–450)
UIBC: 366 ug/dL (ref 148–442)

## 2024-02-18 LAB — VITAMIN D 25 HYDROXY (VIT D DEFICIENCY, FRACTURES): Vit D, 25-Hydroxy: 22.74 ng/mL — ABNORMAL LOW (ref 30–100)

## 2024-02-18 LAB — FERRITIN: Ferritin: 12 ng/mL (ref 11–307)

## 2024-02-18 MED ORDER — ALBUTEROL SULFATE HFA 108 (90 BASE) MCG/ACT IN AERS
2.0000 | INHALATION_SPRAY | Freq: Four times a day (QID) | RESPIRATORY_TRACT | 0 refills | Status: AC | PRN
Start: 2024-02-18 — End: ?

## 2024-02-18 NOTE — Progress Notes (Signed)
 [facility] FOLLOW UP NOTE  Patient Care Team: Nche, Connye Delaine, NP as PCP - General (Internal Medicine) Murleen Arms, MD as Consulting Physician (Hematology and Oncology)  CHIEF COMPLAINTS/PURPOSE OF CONSULTATION:  IDA  ASSESSMENT & PLAN:  Assessment and Plan Assessment & Plan Iron  deficiency anemia Hemoglobin 11.4, improved overall. Symptoms: dyspnea, menorrhagia. Awaiting iron  panel results. - Consider oral iron  supplementation every other day or three times a week   Obesity. - Discuss with primary care provider about initiating Zepbound for weight management. - pt was previously prescribed ozempic which was not covered.  Arthritis of lower back Reports difficulty sleeping, possibly due to lower back arthritis. Flexeril  requires consistent prescribing by the same provider. - Consult primary care provider for Flexeril  prescription if needed.   Follow-up in 6 months with repeat labs.   HISTORY OF PRESENTING ILLNESS:  Erika David 48 y.o. female is here because of IDA  Oncology History   No history exists.   History of Present Illness    The patient, with a history of iron  deficiency anemia, colitis, and heavy menstruation, presents for follow up.  Discussed the use of AI scribe software for clinical note transcription with the patient, who gave verbal consent to proceed.  History of Present Illness Erika David is a 48 year old female with iron  deficiency anemia who presents for follow-up of her condition and medication refills.  She experiences shortness of breath, particularly when climbing stairs. She denies cravings for non-food items like ice or chalk and has stopped eating cornstarch since December 31st. Her menstruation remains heavy for the first couple of days, which she notes as her normal pattern lately. She inquires if the iron  deficiency could be causing bags under her eyes and mentions not sleeping well.  She has not picked up her  prescription for Ozempic, which was intended for weight management, and wants to address her weight issues.  She requests a refill for albuterol , which was previously prescribed by an emergency department provider. She uses cetirizine for allergies, which she thought required a prescription but is informed it is available over the counter.  She has difficulty sleeping, attributing it to arthritis in her lower back. She uses Flexeril , a muscle relaxer, which she believes might be contributing to her sleep issues.  She inquires about her vitamin D  levels, noting they were low in the past and that she completed a course of vitamin D  supplementation.   MEDICAL HISTORY:  Past Medical History:  Diagnosis Date   Abnormal Pap smear     LEEP?  LAST PAP 05/2009   Acute respiratory failure due to COVID-19 (HCC) 01/27/2020   Anemia 01/04/2012   Chlamydia    H/O candidiasis    H/O dysmenorrhea 03/13/02   H/O gonorrhea    treated   H/O menorrhagia 07/09/02   Infection 2006   GONORHRREA   Infection 2006   CHLAMYDIA   Infection    FREQ YEAST   Pelvic injury AS TEEN   SPORTS RELATED   Pelvic pain 07/11/10   Sterilization 01/04/2012   [facility] PP BTL Tubal papers signed    Trichomonas    Yeast infection     SURGICAL HISTORY: Past Surgical History:  Procedure Laterality Date   BIOPSY  11/12/2019   Procedure: BIOPSY;  Surgeon: Asencion Blacksmith, MD;  Location: [facility];  Service: Endoscopy;;   CESAREAN SECTION  04/11/2012   Procedure: CESAREAN SECTION;  Surgeon: Madelene Schanz, MD;  Location: [facility];  Service: Gynecology;  Laterality: N/A;  twins   COLONOSCOPY WITH PROPOFOL  N/A 11/12/2019   Procedure: COLONOSCOPY WITH PROPOFOL ;  Surgeon: Asencion Blacksmith, MD;  Location: [facility];  Service: Endoscopy;  Laterality: N/A;   GYNECOLOGIC CRYOSURGERY     TUBAL LIGATION     WISDOM TOOTH EXTRACTION  2007    SOCIAL HISTORY: Social History   Socioeconomic History   Marital status: Married     Spouse name: Not on file   Number of children: 3   Years of education: 16   Highest education level: Not on file  Occupational History   Occupation: CNA  Tobacco Use   Smoking status: Never   Smokeless tobacco: Never  Vaping Use   Vaping status: Never Used  Substance and Sexual Activity   Alcohol use: Yes    Comment: occasional alcohol   Drug use: No   Sexual activity: Yes    Birth control/protection: Surgical    Comment: btl  Other Topics Concern   Not on file  Social History Narrative   Not on file   Social Drivers of Health   Financial Resource Strain: Not on file  Food Insecurity: Not on file  Transportation Needs: Not on file  Physical Activity: Not on file  Stress: Not on file  Social Connections: Not on file  Intimate Partner Violence: Not on file    FAMILY HISTORY: Family History  Problem Relation Age of Onset   Hypertension Mother    Cancer Mother 19       BREAST   Thyroid  disease Mother    Diabetes Father    Hypertension Sister    Liver disease Sister    Lupus Maternal Aunt    Alcohol abuse Maternal Aunt    Drug abuse Maternal Aunt    Cancer Maternal Grandmother 18       Stomach cancer   Hypertension Maternal Grandmother    Diabetes Maternal Grandmother    Heart disease Maternal Grandfather    Stroke Maternal Grandfather    Asthma Cousin    Anesthesia problems Neg Hx     ALLERGIES:  is allergic to bee pollen, dust mite extract, kiwi extract, pollen extract, other, soy allergy (obsolete), and venofer  [iron  sucrose].  MEDICATIONS:  Current Outpatient Medications  Medication Sig Dispense Refill   albuterol  (VENTOLIN  HFA) 108 (90 Base) MCG/ACT inhaler Inhale 2 puffs into the lungs every 6 (six) hours as needed for wheezing or shortness of breath. 8 g 0   Cetirizine HCl (ZYRTEC PO) Take by mouth.     cyclobenzaprine  (FLEXERIL ) 5 MG tablet Take 1-2 tablets (5-10 mg total) by mouth at bedtime. 14 tablet 0   omeprazole  (PRILOSEC) 20 MG capsule Take 1  capsule (20 mg total) by mouth daily. 15 capsule 0   Semaglutide -Weight Management (WEGOVY ) 0.25 MG/0.5ML SOAJ Inject 0.25 mg into the skin once a week. 2 mL 0   Vitamin D , Ergocalciferol , (DRISDOL ) 1.25 MG (50000 UNIT) CAPS capsule Take 1 capsule (50,000 Units total) by mouth every 7 (seven) days. 12 capsule 0   No current facility-administered medications for this visit.      PHYSICAL EXAMINATION: ECOG PERFORMANCE STATUS: 0 - Asymptomatic  Vitals:   02/18/24 1000 02/18/24 1001  BP: (!) 147/90 (!) 142/96  Pulse: 80   Resp: 17   Temp: 98.3 F (36.8 C)   SpO2: 100%    Filed Weights   02/18/24 1000  Weight: 201 lb 4.8 oz (91.3 kg)    GENERAL:alert, no distress and  comfortable SKIN: skin color, texture, turgor are normal, no rashes or significant lesions EYES: normal, conjunctiva are pink and non-injected, sclera clear OROPHARYNX:no exudate, no erythema and lips, buccal mucosa, and tongue normal  NECK: supple, thyroid  normal size, non-tender, without nodularity LYMPH:  no palpable lymphadenopathy in the cervical, axillary  LUNGS: clear to auscultation and percussion with normal breathing effort HEART: regular rate & rhythm and no murmurs and no lower extremity edema ABDOMEN:abdomen soft, non-tender and normal bowel sounds Musculoskeletal:no cyanosis of digits and no clubbing  PSYCH: alert & oriented x 3 with fluent speech NEURO: no focal motor/sensory deficits  LABORATORY DATA:  I have reviewed the data as listed Lab Results  Component Value Date   WBC 4.8 02/18/2024   HGB 11.4 (L) 02/18/2024   HCT 32.7 (L) 02/18/2024   MCV 81.8 02/18/2024   PLT 275 02/18/2024     Chemistry      Component Value Date/Time   NA 139 10/11/2023 1303   K 3.5 10/11/2023 1303   CL 107 10/11/2023 1303   CO2 24 10/11/2023 1303   BUN 7 10/11/2023 1303   CREATININE 0.78 10/11/2023 1303   CREATININE 0.97 08/19/2023 1012   CREATININE 0.89 05/29/2013 1403      Component Value Date/Time    CALCIUM  9.0 10/11/2023 1303   ALKPHOS 123 08/19/2023 1012   AST 11 (L) 08/19/2023 1012   ALT 9 08/19/2023 1012   BILITOT 0.2 08/19/2023 1012       RADIOGRAPHIC STUDIES: I have personally reviewed the radiological images as listed and agreed with the findings in the report. No results found.  All questions were answered. The patient knows to call the clinic with any problems, questions or concerns.     Murleen Arms, MD 02/18/2024 10:14 AM

## 2024-02-19 ENCOUNTER — Ambulatory Visit: Payer: Self-pay | Admitting: Hematology and Oncology

## 2024-02-21 ENCOUNTER — Other Ambulatory Visit: Payer: Self-pay | Admitting: *Deleted

## 2024-02-21 ENCOUNTER — Encounter: Payer: Self-pay | Admitting: Hematology and Oncology

## 2024-02-21 ENCOUNTER — Other Ambulatory Visit (HOSPITAL_BASED_OUTPATIENT_CLINIC_OR_DEPARTMENT_OTHER): Payer: Self-pay

## 2024-02-21 DIAGNOSIS — E559 Vitamin D deficiency, unspecified: Secondary | ICD-10-CM

## 2024-02-21 MED ORDER — VITAMIN D (ERGOCALCIFEROL) 1.25 MG (50000 UNIT) PO CAPS
50000.0000 [IU] | ORAL_CAPSULE | ORAL | 0 refills | Status: DC
  Filled 2024-02-21: qty 4, 28d supply, fill #0

## 2024-03-03 ENCOUNTER — Other Ambulatory Visit (HOSPITAL_BASED_OUTPATIENT_CLINIC_OR_DEPARTMENT_OTHER): Payer: Self-pay

## 2024-03-09 ENCOUNTER — Ambulatory Visit (INDEPENDENT_AMBULATORY_CARE_PROVIDER_SITE_OTHER): Admitting: Nurse Practitioner

## 2024-03-09 ENCOUNTER — Encounter: Payer: Self-pay | Admitting: Nurse Practitioner

## 2024-03-09 VITALS — BP 134/80 | HR 92 | Temp 98.2°F | Ht 61.0 in | Wt 204.6 lb

## 2024-03-09 DIAGNOSIS — E66812 Obesity, class 2: Secondary | ICD-10-CM

## 2024-03-09 DIAGNOSIS — I10 Essential (primary) hypertension: Secondary | ICD-10-CM | POA: Diagnosis not present

## 2024-03-09 DIAGNOSIS — Z6838 Body mass index (BMI) 38.0-38.9, adult: Secondary | ICD-10-CM

## 2024-03-09 DIAGNOSIS — E559 Vitamin D deficiency, unspecified: Secondary | ICD-10-CM

## 2024-03-09 DIAGNOSIS — G8929 Other chronic pain: Secondary | ICD-10-CM | POA: Diagnosis not present

## 2024-03-09 DIAGNOSIS — M545 Low back pain, unspecified: Secondary | ICD-10-CM

## 2024-03-09 DIAGNOSIS — Z136 Encounter for screening for cardiovascular disorders: Secondary | ICD-10-CM

## 2024-03-09 DIAGNOSIS — R7303 Prediabetes: Secondary | ICD-10-CM | POA: Diagnosis not present

## 2024-03-09 DIAGNOSIS — Z13220 Encounter for screening for lipoid disorders: Secondary | ICD-10-CM | POA: Diagnosis not present

## 2024-03-09 LAB — LIPID PANEL
Cholesterol: 190 mg/dL (ref 0–200)
HDL: 50.4 mg/dL (ref >39.00)
LDL Cholesterol: 125 mg/dL — ABNORMAL HIGH (ref 0–99)
NonHDL: 139.81
Total CHOL/HDL Ratio: 4
Triglycerides: 73 mg/dL (ref 0.0–149.0)
VLDL: 14.6 mg/dL (ref 0.0–40.0)

## 2024-03-09 LAB — HEMOGLOBIN A1C: Hgb A1c MFr Bld: 6.1 % (ref 4.6–6.5)

## 2024-03-09 LAB — TSH: TSH: 1.04 u[IU]/mL (ref 0.35–5.50)

## 2024-03-09 MED ORDER — VITAMIN D (ERGOCALCIFEROL) 1.25 MG (50000 UNIT) PO CAPS: 50000.0000 [IU] | ORAL_CAPSULE | ORAL | 0 refills | Status: AC

## 2024-03-09 MED ORDER — ACETAMINOPHEN 500 MG PO TABS: 1000.0000 mg | ORAL_TABLET | Freq: Three times a day (TID) | ORAL | Status: AC | PRN

## 2024-03-09 MED ORDER — CYCLOBENZAPRINE HCL 10 MG PO TABS
10.0000 mg | ORAL_TABLET | Freq: Every day | ORAL | 5 refills | Status: AC
Start: 2024-03-09 — End: ?

## 2024-03-09 MED ORDER — TRIAMTERENE-HCTZ 37.5-25 MG PO TABS: 0.5000 | ORAL_TABLET | Freq: Every day | ORAL | 1 refills | Status: DC

## 2024-03-09 NOTE — Assessment & Plan Note (Signed)
 Sent high dose vit. D x 12weeks, then switch to OVER THE COUNTER dose 2000IU daily.

## 2024-03-09 NOTE — Assessment & Plan Note (Addendum)
 ECG: NSR, no LVH, peak P-waves (right atrium enlargement?) No OBSTRUCTIVE SLEEP APNEA symptoms. Reports LE edema with prolonged standing or walking or sitting. Normal CMP 02/2024 BP Readings from Last 3 Encounters:  03/09/24 134/80  02/18/24 (!) 142/96  10/11/23 (!) 144/96    Start maxzide 0.5tab daily Encouraged to maintain a low salt diet. Check THYROID  F/up in 75month

## 2024-03-09 NOTE — Assessment & Plan Note (Signed)
 Persistent low back pain, worse in supine position or prolonged sitting No radicular pain, no paresthesia, no change in GI/GU function. Did not complete PT sessions.  Entered new PT referral Advised to use tylenol  and flexeril , need for weight loss F/up in 26month

## 2024-03-09 NOTE — Progress Notes (Signed)
 Established Patient Visit  Patient: Erika David   DOB: 12-Sep-1975   48 y.o. Female  MRN: [id number] Visit Date: 03/09/2024  Subjective:    Chief Complaint  Patient presents with   Follow-up    Discuss Weight loss options and mid to lower back pain    HPI Vitamin D  deficiency Sent high dose vit. D x 12weeks, then switch to OVER THE COUNTER dose 2000IU daily.  Obesity Diet: daily (bought from restaurant-low fat but high sodium, snack-chips) Exercise: walking 2x/week Sleep: interrupted due to chronic back pain. No tobacco use or ALCOHOL use or illicit drug use. Wt Readings from Last 3 Encounters:  03/09/24 204 lb 9.6 oz (92.8 kg)  02/18/24 201 lb 4.8 oz (91.3 kg)  10/11/23 194 lb (88 kg)    Avoid phentermine and qsymia due to HYPERTENSION Avoid contrave and GLP-1 due to minimal food intake. She declined referral to nutritionist. We discussed ways to measure appropriate serving sizes, healthy snack options, and adequate oral hydration Provided printed information. F/up in 46month  Primary hypertension ECG: NSR, no LVH, peak P-waves (right atrium enlargement?) No OBSTRUCTIVE SLEEP APNEA symptoms. Reports LE edema with prolonged standing or walking or sitting. Normal CMP 02/2024 BP Readings from Last 3 Encounters:  03/09/24 134/80  02/18/24 (!) 142/96  10/11/23 (!) 144/96    Start maxzide 0.5tab daily Encouraged to maintain a low salt diet. Check THYROID  F/up in 46month  Chronic back pain Persistent low back pain, worse in supine position or prolonged sitting No radicular pain, no paresthesia, no change in GI/GU function. Did not complete PT sessions.  Entered new PT referral Advised to use tylenol  and flexeril , need for weight loss F/up in 46month    Reviewed medical, surgical, and social history today  Medications: Outpatient Medications Prior to Visit  Medication Sig   albuterol  (VENTOLIN  HFA) 108 (90 Base) MCG/ACT inhaler Inhale 2 puffs  into the lungs every 6 (six) hours as needed for wheezing or shortness of breath.   Cetirizine HCl (ZYRTEC PO) Take by mouth.   omeprazole  (PRILOSEC) 20 MG capsule Take 1 capsule (20 mg total) by mouth daily.   [DISCONTINUED] cyclobenzaprine  (FLEXERIL ) 5 MG tablet Take 1-2 tablets (5-10 mg total) by mouth at bedtime.   [DISCONTINUED] Semaglutide -Weight Management (WEGOVY ) 0.25 MG/0.5ML SOAJ Inject 0.25 mg into the skin once a week. (Patient not taking: Reported on 03/09/2024)   [DISCONTINUED] Vitamin D , Ergocalciferol , (DRISDOL ) 1.25 MG (50000 UNIT) CAPS capsule Take 1 capsule (50,000 Units total) by mouth every 7 (seven) days. Then take 1000 units daily (Patient not taking: Reported on 03/09/2024)   No facility-administered medications prior to visit.   Reviewed past medical and social history.   ROS per HPI above  Last CBC Lab Results  Component Value Date   WBC 4.8 02/18/2024   HGB 11.4 (L) 02/18/2024   HCT 32.7 (L) 02/18/2024   MCV 81.8 02/18/2024   MCH 28.5 02/18/2024   RDW 13.1 02/18/2024   PLT 275 02/18/2024   Last metabolic panel Lab Results  Component Value Date   GLUCOSE 93 02/18/2024   NA 140 02/18/2024   K 3.6 02/18/2024   CL 108 02/18/2024   CO2 26 02/18/2024   BUN 11 02/18/2024   CREATININE 0.88 02/18/2024   GFRNONAA >60 02/18/2024   CALCIUM  9.0 02/18/2024   PROT 7.2 02/18/2024   ALBUMIN 4.3 02/18/2024   BILITOT 0.3 02/18/2024  ALKPHOS 105 02/18/2024   AST 10 (L) 02/18/2024   ALT 8 02/18/2024   ANIONGAP 6 02/18/2024        Objective:  BP 134/80 (BP Location: Left Arm, Patient Position: Sitting, Cuff Size: Large)   Pulse 92   Temp 98.2 F (36.8 C) (Oral)   Ht 5' 1 (1.549 m)   Wt 204 lb 9.6 oz (92.8 kg)   LMP 02/19/2024   SpO2 99%   BMI 38.66 kg/m      Physical Exam Vitals and nursing note reviewed.  Constitutional:      Appearance: She is obese.   Cardiovascular:     Rate and Rhythm: Normal rate and regular rhythm.     Pulses: Normal  pulses.     Heart sounds: Normal heart sounds.  Pulmonary:     Effort: Pulmonary effort is normal.     Breath sounds: Normal breath sounds.   Musculoskeletal:     Right lower leg: No edema.     Left lower leg: No edema.   Neurological:     Mental Status: She is alert and oriented to person, place, and time.     No results found for any visits on 03/09/24.    Assessment & Plan:    Problem List Items Addressed This Visit     Chronic back pain   Persistent low back pain, worse in supine position or prolonged sitting No radicular pain, no paresthesia, no change in GI/GU function. Did not complete PT sessions.  Entered new PT referral Advised to use tylenol  and flexeril , need for weight loss F/up in 36month      Relevant Medications   cyclobenzaprine  (FLEXERIL ) 10 MG tablet   acetaminophen  (TYLENOL ) 500 MG tablet   Other Relevant Orders   Ambulatory referral to Physical Therapy   Obesity - Primary   Diet: daily (bought from restaurant-low fat but high sodium, snack-chips) Exercise: walking 2x/week Sleep: interrupted due to chronic back pain. No tobacco use or ALCOHOL use or illicit drug use. Wt Readings from Last 3 Encounters:  03/09/24 204 lb 9.6 oz (92.8 kg)  02/18/24 201 lb 4.8 oz (91.3 kg)  10/11/23 194 lb (88 kg)    Avoid phentermine and qsymia due to HYPERTENSION Avoid contrave and GLP-1 due to minimal food intake. She declined referral to nutritionist. We discussed ways to measure appropriate serving sizes, healthy snack options, and adequate oral hydration Provided printed information. F/up in 36month      Primary hypertension   ECG: NSR, no LVH, peak P-waves (right atrium enlargement?) No OBSTRUCTIVE SLEEP APNEA symptoms. Reports LE edema with prolonged standing or walking or sitting. Normal CMP 02/2024 BP Readings from Last 3 Encounters:  03/09/24 134/80  02/18/24 (!) 142/96  10/11/23 (!) 144/96    Start maxzide 0.5tab daily Encouraged to  maintain a low salt diet. Check THYROID  F/up in 36month      Relevant Medications   triamterene-hydrochlorothiazide (MAXZIDE-25) 37.5-25 MG tablet   Other Relevant Orders   TSH   Vitamin D  deficiency   Sent high dose vit. D x 12weeks, then switch to OVER THE COUNTER dose 2000IU daily.      Relevant Medications   Vitamin D , Ergocalciferol , (DRISDOL ) 1.25 MG (50000 UNIT) CAPS capsule   Other Visit Diagnoses       Prediabetes       Relevant Orders   Hemoglobin A1c     Encounter for lipid screening for cardiovascular disease       Relevant Orders  Lipid panel      Return in about 4 weeks (around 04/06/2024) for HTN, Weight management.     Roselie Mood, NP

## 2024-03-09 NOTE — Patient Instructions (Addendum)
 Go to lab Maintain Heart healthy diet (low salt) and daily exercise. Start maxzide, vit. D, tylenol , and flexeril   Mediterranean Diet A Mediterranean diet is based on the traditions of countries on the [employer]. It focuses on eating more: Fruits and vegetables. Whole grains, beans, nuts, and seeds. Heart-healthy fats. These are fats that are good for your heart. It involves eating less: Dairy. Meat and eggs. Processed foods with added sugar, salt, and fat. This type of diet can help prevent certain conditions. It can also improve outcomes if you have a long-term (chronic) disease, such as kidney or heart disease. What are tips for following this plan? Reading food labels Check packaged foods for: The serving size. For foods such as rice and pasta, the serving size is the amount of cooked product, not dry. The total fat. Avoid foods with saturated fat or trans fat. Added sugars, such as corn syrup. Shopping  Try to have a balanced diet. Buy a variety of foods, such as: Fresh fruits and vegetables. You may be able to get these from local farmers markets. You can also buy them frozen. Grains, beans, nuts, and seeds. Some of these can be bought in bulk. Fresh seafood. Poultry and eggs. Low-fat dairy products. Buy whole ingredients instead of foods that have already been packaged. If you can't get fresh seafood, buy precooked frozen shrimp or canned fish, such as tuna, salmon, or sardines. Stock your pantry so you always have certain foods on hand, such as olive oil, canned tuna, canned tomatoes, rice, pasta, and beans. Cooking Cook foods with extra-virgin olive oil instead of using butter or other vegetable oils. Have meat as a side dish. Have vegetables or grains as your main dish. This means having meat in small portions or adding small amounts of meat to foods like pasta or stew. Use beans or vegetables instead of meat in common dishes like chili or lasagna. Try out  different cooking methods. Try roasting, broiling, steaming, and sauting vegetables. Add frozen vegetables to soups, stews, pasta, or rice. Add nuts or seeds for added healthy fats and plant protein at each meal. You can add these to yogurt, salads, or vegetable dishes. Marinate fish or vegetables using olive oil, lemon juice, garlic, and fresh herbs. Meal planning Plan to eat a vegetarian meal one day each week. Try to work up to two vegetarian meals, if possible. Eat seafood two or more times a week. Have healthy snacks on hand. These may include: Vegetable sticks with hummus. Greek yogurt. Fruit and nut trail mix. Eat balanced meals. These should include: Fruit: 2-3 servings a day. Vegetables: 4-5 servings a day. Low-fat dairy: 2 servings a day. Fish, poultry, or lean meat: 1 serving a day. Beans and legumes: 2 or more servings a week. Nuts and seeds: 1-2 servings a day. Whole grains: 6-8 servings a day. Extra-virgin olive oil: 3-4 servings a day. Limit red meat and sweets to just a few servings a month. Lifestyle  Try to cook and eat meals with your family. Drink enough fluid to keep your pee (urine) pale yellow. Be active every day. This includes: Aerobic exercise, which is exercise that causes your heart to beat faster. Examples include running and swimming. Leisure activities like gardening, walking, or housework. Get 7-8 hours of sleep each night. Drink red wine if your provider says you can. A glass of wine is 5 oz (150 mL). You may be allowed to have: Up to 1 glass a day if you're female and  not pregnant. Up to 2 glasses a day if you're female. What foods should I eat? Fruits Apples. Apricots. Avocado. Berries. Bananas. Cherries. Dates. Figs. Grapes. Lemons. Melon. Oranges. Peaches. Plums. Pomegranate. Vegetables Artichokes. Beets. Broccoli. Cabbage. Carrots. Eggplant. Green beans. Chard. Kale. Spinach. Onions. Leeks. Peas. Squash. Tomatoes. Peppers.  Radishes. Grains Whole-grain pasta. Brown rice. Bulgur wheat. Polenta. Couscous. Whole-wheat bread. [place] Madeira. Meats and other proteins Beans. Almonds. Sunflower seeds. Pine nuts. Peanuts. Cod. Salmon. Scallops. Shrimp. Tuna. Tilapia. Clams. Oysters. Eggs. Chicken or [place] without skin. Dairy Low-fat milk. Cheese. Greek yogurt. Fats and oils Extra-virgin olive oil. Avocado oil. Grapeseed oil. Beverages Water. Red wine. Herbal tea. Sweets and desserts Greek yogurt with honey. Baked apples. Poached pears. Trail mix. Seasonings and condiments Basil. Cilantro. Coriander. Cumin. Mint. Parsley. Sage. Rosemary. Tarragon. Garlic. Oregano. Thyme. Pepper. Balsamic vinegar. Tahini. Hummus. Tomato sauce. Olives. Mushrooms. The items listed above may not be all the foods and drinks you can have. Talk to a dietitian to learn more. What foods should I limit? This is a list of foods that should be eaten rarely. Fruits Fruit canned in syrup. Vegetables Deep-fried potatoes, like [place] fries. Grains Packaged pasta or rice dishes. Cereal with added sugar. Snacks with added sugar. Meats and other proteins Beef. Pork. Lamb. Chicken or [place] with skin. Hot dogs. [place]. Dairy Ice cream. Sour cream. Whole milk. Fats and oils Butter. Canola oil. Vegetable oil. Beef fat (tallow). Lard. Beverages Juice. Sugar-sweetened soft drinks. Beer. Liquor and spirits. Sweets and desserts Cookies. Cakes. Pies. Candy. Seasonings and condiments Mayonnaise. Pre-made sauces and marinades. The items listed above may not be all the foods and drinks you should limit. Talk to a dietitian to learn more. Where to find more information American Heart Association (AHA): heart.org This information is not intended to replace advice given to you by your health care provider. Make sure you discuss any questions you have with your health care provider. Document Revised: 12/09/2022 Document Reviewed: 12/09/2022 Elsevier  Patient Education  2024 [employer]  How to Increase Your Level of Physical Activity Getting regular physical activity is important for your overall health and well-being. Most people do not get enough exercise. There are easy ways to increase your level of physical activity, even if you have not been very active in the past or if you are just starting out. What are the benefits of physical activity? Physical activity has many short-term and long-term benefits. Being active on a regular basis can improve your physical and mental health as well as provide other benefits. Physical health benefits Helping you lose weight or maintain a healthy weight. Strengthening your muscles and bones. Reducing your risk of certain long-term (chronic) diseases, including heart disease, cancer, and diabetes. Being able to move around more easily and for longer periods of time without getting tired (increased endurance or stamina). Improving your ability to fight off illness (enhanced immunity). Being able to sleep better. Helping you stay healthy as you get older, including: Helping you stay mobile, or capable of walking and moving around. Preventing accidents, such as falls. Increasing life expectancy. Mental health benefits Boosting your mood and improving your self-esteem. Lowering your chance of having mental health problems, such as depression or anxiety. Helping you feel good about your body. Other benefits Finding new sources of fun and enjoyment. Meeting new people who share a common interest. Before you begin If you have a chronic illness or have not been active for a while, check with your health care provider about  how to get started. Ask your health care provider what activities are safe for you. Start out slowly. Walking or doing some simple chair exercises is a good place to start, especially if you have not been active before or for a long time. Set goals that you can work toward. Ask your  health care provider how much exercise is best for you. In general, most adults should: Do moderate-intensity exercise for at least 150 minutes each week (30 minutes on most days of the week) or vigorous exercise for at least 75 minutes each week, or a combination of these. Moderate-intensity exercise can include walking at a quick pace, biking, yoga, water aerobics, or gardening. Vigorous exercise involves activities that take more effort, such as jogging or running, playing sports, swimming laps, or jumping rope. Do strength exercises on at least 2 days each week. This can include weight lifting, body weight exercises, and resistance-band exercises. How to be more physically active Make a plan  Try to find activities that you enjoy. You are more likely to commit to an exercise routine if it does not feel like a chore. If you have bone or joint problems, choose low-impact exercises, like walking or swimming. Use these tips for being successful with an exercise plan: Find a workout partner for accountability. Join a group or class, such as an aerobics class, cycling class, or sports team. Make family time active. Go for a walk, bike, or swim. Include a variety of exercises each week. Consider using a fitness tracker, such as a mobile phone app or a device worn like a watch, that will count the number of steps you take each day. Many people strive to reach 10,000 steps a day. Find ways to be active in your daily routines Besides your formal exercise plans, you can find ways to do physical activity during your daily routines, such as: Walking or biking to work or to the store. Taking the stairs instead of the elevator. Parking farther away from the door at work or at the store. Planning walking meetings. Walking around while you are on the phone. Where to find more information Centers for Disease Control and Prevention: [employer].com.pt President's Council on Fitness, Sports &  Nutrition: www.fitness.gov ChooseMyPlate: http://www.harvey.com/ Contact a health care provider if: You have headaches, muscle aches, or joint pain that is concerning. You feel dizzy or light-headed while exercising. You faint. You feel your heart skipping, racing, or fluttering. You have chest pain while exercising. Summary Exercise benefits your mind and body at any age, even if you are just starting out. If you have a chronic illness or have not been active for a while, check with your health care provider before increasing your physical activity. Choose activities that are safe and enjoyable for you. Ask your health care provider what activities are safe for you. Start slowly. Tell your health care provider if you have problems as you start to increase your activity level. This information is not intended to replace advice given to you by your health care provider. Make sure you discuss any questions you have with your health care provider. Document Revised: 12/23/2020 Document Reviewed: 12/23/2020 Elsevier Patient Education  2024 [employer].

## 2024-03-09 NOTE — Assessment & Plan Note (Signed)
 Diet: daily (bought from restaurant-low fat but high sodium, snack-chips) Exercise: walking 2x/week Sleep: interrupted due to chronic back pain. No tobacco use or ALCOHOL use or illicit drug use. Wt Readings from Last 3 Encounters:  03/09/24 204 lb 9.6 oz (92.8 kg)  02/18/24 201 lb 4.8 oz (91.3 kg)  10/11/23 194 lb (88 kg)    Avoid phentermine and qsymia due to HYPERTENSION Avoid contrave and GLP-1 due to minimal food intake. She declined referral to nutritionist. We discussed ways to measure appropriate serving sizes, healthy snack options, and adequate oral hydration Provided printed information. F/up in 56month

## 2024-03-10 ENCOUNTER — Ambulatory Visit: Payer: Self-pay | Admitting: Nurse Practitioner

## 2024-03-12 NOTE — Telephone Encounter (Signed)
 Called patient's pharmacy on file to ask about the vitamin d  Rx. Was informed that the Rx was there and that on the day patient came to pick up medications that it may have taken longer for the communication from out office to the pharmacy a little longer. She stated that they will get it prepared for patient to pick up within the hour

## 2024-03-12 NOTE — Telephone Encounter (Signed)
 Called patient and informed her that her vitamin D  Rx is at her pharmacy and is being prepared for her to pick up within the hour per the pharmacy. She thanked me for calling.

## 2024-04-06 ENCOUNTER — Ambulatory Visit: Admitting: Nurse Practitioner

## 2024-04-13 ENCOUNTER — Ambulatory Visit (INDEPENDENT_AMBULATORY_CARE_PROVIDER_SITE_OTHER): Admitting: Nurse Practitioner

## 2024-04-13 ENCOUNTER — Encounter: Payer: Self-pay | Admitting: Nurse Practitioner

## 2024-04-13 VITALS — BP 134/86 | HR 70 | Temp 98.4°F | Ht 61.5 in | Wt 200.6 lb

## 2024-04-13 DIAGNOSIS — E66812 Obesity, class 2: Secondary | ICD-10-CM

## 2024-04-13 DIAGNOSIS — Z6837 Body mass index (BMI) 37.0-37.9, adult: Secondary | ICD-10-CM

## 2024-04-13 DIAGNOSIS — I10 Essential (primary) hypertension: Secondary | ICD-10-CM

## 2024-04-13 MED ORDER — ZEPBOUND 2.5 MG/0.5ML ~~LOC~~ SOAJ: 2.5000 mg | SUBCUTANEOUS | 0 refills | Status: DC

## 2024-04-13 NOTE — Patient Instructions (Signed)
 Start zepbound  Continue daily exercise and heart healthy diet  How to Increase Your Level of Physical Activity Getting regular physical activity is important for your overall health and well-being. Most people do not get enough exercise. There are easy ways to increase your level of physical activity, even if you have not been very active in the past or if you are just starting out. What are the benefits of physical activity? Physical activity has many short-term and long-term benefits. Being active on a regular basis can improve your physical and mental health as well as provide other benefits. Physical health benefits Helping you lose weight or maintain a healthy weight. Strengthening your muscles and bones. Reducing your risk of certain long-term (chronic) diseases, including heart disease, cancer, and diabetes. Being able to move around more easily and for longer periods of time without getting tired (increased endurance or stamina). Improving your ability to fight off illness (enhanced immunity). Being able to sleep better. Helping you stay healthy as you get older, including: Helping you stay mobile, or capable of walking and moving around. Preventing accidents, such as falls. Increasing life expectancy. Mental health benefits Boosting your mood and improving your self-esteem. Lowering your chance of having mental health problems, such as depression or anxiety. Helping you feel good about your body. Other benefits Finding new sources of fun and enjoyment. Meeting new people who share a common interest. Before you begin If you have a chronic illness or have not been active for a while, check with your health care provider about how to get started. Ask your health care provider what activities are safe for you. Start out slowly. Walking or doing some simple chair exercises is a good place to start, especially if you have not been active before or for a long time. Set goals that you  can work toward. Ask your health care provider how much exercise is best for you. In general, most adults should: Do moderate-intensity exercise for at least 150 minutes each week (30 minutes on most days of the week) or vigorous exercise for at least 75 minutes each week, or a combination of these. Moderate-intensity exercise can include walking at a quick pace, biking, yoga, water aerobics, or gardening. Vigorous exercise involves activities that take more effort, such as jogging or running, playing sports, swimming laps, or jumping rope. Do strength exercises on at least 2 days each week. This can include weight lifting, body weight exercises, and resistance-band exercises. How to be more physically active Make a plan  Try to find activities that you enjoy. You are more likely to commit to an exercise routine if it does not feel like a chore. If you have bone or joint problems, choose low-impact exercises, like walking or swimming. Use these tips for being successful with an exercise plan: Find a workout partner for accountability. Join a group or class, such as an aerobics class, cycling class, or sports team. Make family time active. Go for a walk, bike, or swim. Include a variety of exercises each week. Consider using a fitness tracker, such as a mobile phone app or a device worn like a watch, that will count the number of steps you take each day. Many people strive to reach 10,000 steps a day. Find ways to be active in your daily routines Besides your formal exercise plans, you can find ways to do physical activity during your daily routines, such as: Walking or biking to work or to the store. Taking the stairs instead of  the elevator. Parking farther away from the door at work or at the store. Planning walking meetings. Walking around while you are on the phone. Where to find more information Centers for Disease Control and Prevention: [employer]Casting.com.pt President's Council  on Fitness, Sports & Nutrition: www.fitness.gov ChooseMyPlate: http://www.harvey.com/ Contact a health care provider if: You have headaches, muscle aches, or joint pain that is concerning. You feel dizzy or light-headed while exercising. You faint. You feel your heart skipping, racing, or fluttering. You have chest pain while exercising. Summary Exercise benefits your mind and body at any age, even if you are just starting out. If you have a chronic illness or have not been active for a while, check with your health care provider before increasing your physical activity. Choose activities that are safe and enjoyable for you. Ask your health care provider what activities are safe for you. Start slowly. Tell your health care provider if you have problems as you start to increase your activity level. This information is not intended to replace advice given to you by your health care provider. Make sure you discuss any questions you have with your health care provider. Document Revised: 12/23/2020 Document Reviewed: 12/23/2020 Elsevier Patient Education  2024 [employer].

## 2024-04-13 NOTE — Assessment & Plan Note (Signed)
 Stable BP and no adverse effect with maxzide BP Readings from Last 3 Encounters:  04/13/24 134/86  03/09/24 134/80  02/18/24 (!) 142/96    Encouraged to maintain a low salt diet and daily exercise F/up in 37month

## 2024-04-13 NOTE — Assessment & Plan Note (Addendum)
 Lost 4lbs in 30days throught diet modification an increased daily steps Diet: stopped high calorie snacks. Maintain high protein and high fiber diet. Maintains daily Exercise: walking daily Wt Readings from Last 3 Encounters:  04/13/24 200 lb 9.6 oz (91 kg)  03/09/24 204 lb 9.6 oz (92.8 kg)  02/18/24 201 lb 4.8 oz (91.3 kg)    Start zepbound  2.5mg  weekly Encourage to increase daily exercise F/up in 69month

## 2024-04-13 NOTE — Progress Notes (Signed)
 Established Patient Visit  Patient: Erika David   DOB: 1976-04-05   48 y.o. Female  MRN: [id number] Visit Date: 04/13/2024  Subjective:    Chief Complaint  Patient presents with   Follow-up    4 week follow up for HTN and weight management    HPI Obesity Lost 4lbs in 30days throught diet modification an increased daily steps Diet: stopped high calorie snacks. Maintain high protein and high fiber diet. Maintains daily Exercise: walking daily Wt Readings from Last 3 Encounters:  04/13/24 200 lb 9.6 oz (91 kg)  03/09/24 204 lb 9.6 oz (92.8 kg)  02/18/24 201 lb 4.8 oz (91.3 kg)    Start zepbound  2.5mg  weekly Encourage to increase daily exercise F/up in 55month  Primary hypertension Stable BP and no adverse effect with maxzide BP Readings from Last 3 Encounters:  04/13/24 134/86  03/09/24 134/80  02/18/24 (!) 142/96    Encouraged to maintain a low salt diet and daily exercise F/up in 55month   Reviewed medical, surgical, and social history today  Medications: Outpatient Medications Prior to Visit  Medication Sig   acetaminophen  (TYLENOL ) 500 MG tablet Take 2 tablets (1,000 mg total) by mouth every 8 (eight) hours as needed.   albuterol  (VENTOLIN  HFA) 108 (90 Base) MCG/ACT inhaler Inhale 2 puffs into the lungs every 6 (six) hours as needed for wheezing or shortness of breath.   Cetirizine HCl (ZYRTEC PO) Take by mouth.   cyclobenzaprine  (FLEXERIL ) 10 MG tablet Take 1 tablet (10 mg total) by mouth at bedtime.   omeprazole  (PRILOSEC) 20 MG capsule Take 1 capsule (20 mg total) by mouth daily.   triamterene -hydrochlorothiazide (MAXZIDE-25) 37.5-25 MG tablet Take 0.5 tablets by mouth daily.   Vitamin D , Ergocalciferol , (DRISDOL ) 1.25 MG (50000 UNIT) CAPS capsule Take 1 capsule (50,000 Units total) by mouth every 7 (seven) days. Then take 1000 units daily   No facility-administered medications prior to visit.   Reviewed past medical and social history.    ROS per HPI above  Last metabolic panel Lab Results  Component Value Date   GLUCOSE 93 02/18/2024   NA 140 02/18/2024   K 3.6 02/18/2024   CL 108 02/18/2024   CO2 26 02/18/2024   BUN 11 02/18/2024   CREATININE 0.88 02/18/2024   GFRNONAA >60 02/18/2024   CALCIUM  9.0 02/18/2024   PROT 7.2 02/18/2024   ALBUMIN 4.3 02/18/2024   BILITOT 0.3 02/18/2024   ALKPHOS 105 02/18/2024   AST 10 (L) 02/18/2024   ALT 8 02/18/2024   ANIONGAP 6 02/18/2024   Last lipids Lab Results  Component Value Date   CHOL 190 03/09/2024   HDL 50.40 03/09/2024   LDLCALC 125 (H) 03/09/2024   LDLDIRECT 145.0 05/22/2021   TRIG 73.0 03/09/2024   CHOLHDL 4 03/09/2024   Last hemoglobin A1c Lab Results  Component Value Date   HGBA1C 6.1 03/09/2024   Last thyroid  functions Lab Results  Component Value Date   TSH 1.04 03/09/2024        Objective:  BP 134/86 (BP Location: Left Arm, Patient Position: Sitting, Cuff Size: Large)   Pulse 70   Temp 98.4 F (36.9 C) (Oral)   Ht 5' 1.5 (1.562 m)   Wt 200 lb 9.6 oz (91 kg)   LMP 04/08/2024   SpO2 98%   BMI 37.29 kg/m      Physical Exam Vitals and nursing note reviewed.  Constitutional:  Appearance: She is obese.  Cardiovascular:     Rate and Rhythm: Normal rate and regular rhythm.     Pulses: Normal pulses.     Heart sounds: Normal heart sounds.  Pulmonary:     Effort: Pulmonary effort is normal.     Breath sounds: Normal breath sounds.  Musculoskeletal:     Right lower leg: No edema.     Left lower leg: No edema.  Neurological:     Mental Status: She is alert and oriented to person, place, and time.     No results found for any visits on 04/13/24.    Assessment & Plan:    Problem List Items Addressed This Visit     Obesity   Lost 4lbs in 30days throught diet modification an increased daily steps Diet: stopped high calorie snacks. Maintain high protein and high fiber diet. Maintains daily Exercise: walking daily Wt  Readings from Last 3 Encounters:  04/13/24 200 lb 9.6 oz (91 kg)  03/09/24 204 lb 9.6 oz (92.8 kg)  02/18/24 201 lb 4.8 oz (91.3 kg)    Start zepbound  2.5mg  weekly Encourage to increase daily exercise F/up in 679month      Relevant Medications   ZEPBOUND  2.5 MG/0.5ML Pen   Primary hypertension - Primary   Stable BP and no adverse effect with maxzide BP Readings from Last 3 Encounters:  04/13/24 134/86  03/09/24 134/80  02/18/24 (!) 142/96    Encouraged to maintain a low salt diet and daily exercise F/up in 679month      Relevant Medications   ZEPBOUND  2.5 MG/0.5ML Pen   Return in about 4 weeks (around 05/11/2024) for HTN, Weight management, hyperlipidemia (fasting).     Roselie Mood, NP

## 2024-04-27 ENCOUNTER — Encounter (HOSPITAL_BASED_OUTPATIENT_CLINIC_OR_DEPARTMENT_OTHER): Payer: Self-pay | Admitting: Physical Therapy

## 2024-04-27 ENCOUNTER — Ambulatory Visit (HOSPITAL_BASED_OUTPATIENT_CLINIC_OR_DEPARTMENT_OTHER): Attending: Nurse Practitioner | Admitting: Physical Therapy

## 2024-04-27 ENCOUNTER — Other Ambulatory Visit: Payer: Self-pay

## 2024-04-27 DIAGNOSIS — M6281 Muscle weakness (generalized): Secondary | ICD-10-CM | POA: Insufficient documentation

## 2024-04-27 DIAGNOSIS — M5459 Other low back pain: Secondary | ICD-10-CM | POA: Insufficient documentation

## 2024-04-27 DIAGNOSIS — G8929 Other chronic pain: Secondary | ICD-10-CM | POA: Insufficient documentation

## 2024-04-27 DIAGNOSIS — R252 Cramp and spasm: Secondary | ICD-10-CM | POA: Diagnosis present

## 2024-04-27 DIAGNOSIS — M545 Low back pain, unspecified: Secondary | ICD-10-CM | POA: Diagnosis not present

## 2024-04-27 NOTE — Therapy (Signed)
 OUTPATIENT PHYSICAL THERAPY THORACOLUMBAR EVALUATION   Patient Name: Erika David MRN: [id number] DOB:04-05-1976, 48 y.o., female Today's Date: 04/27/2024  END OF SESSION:  PT End of Session - 04/27/24 0827     Visit Number 1    Number of Visits 16    Date for PT Re-Evaluation 06/22/24    Authorization Type [employer]    PT Start Time 0807    PT Stop Time 0845    PT Time Calculation (min) 38 min    Activity Tolerance Patient tolerated treatment well    Behavior During Therapy [facility] for tasks assessed/performed          Past Medical History:  Diagnosis Date   Abnormal Pap smear     LEEP?  LAST PAP 05/2009   Acute respiratory failure due to COVID-19 (HCC) 01/27/2020   Anemia 01/04/2012   Chlamydia    H/O candidiasis    H/O dysmenorrhea 03/13/02   H/O gonorrhea    treated   H/O menorrhagia 07/09/02   Infection 2006   GONORHRREA   Infection 2006   CHLAMYDIA   Infection    FREQ YEAST   Pelvic injury AS TEEN   SPORTS RELATED   Pelvic pain 07/11/10   Sterilization 01/04/2012   Desires PP BTL Tubal papers signed    Trichomonas    Yeast infection    Past Surgical History:  Procedure Laterality Date   BIOPSY  11/12/2019   Procedure: BIOPSY;  Surgeon: Aneita Gwendlyn DASEN, MD;  Location: [facility];  Service: Endoscopy;;   CESAREAN SECTION  04/11/2012   Procedure: CESAREAN SECTION;  Surgeon: Jon CINDERELLA Rummer, MD;  Location: [facility];  Service: Gynecology;  Laterality: N/A;  twins   COLONOSCOPY WITH PROPOFOL  N/A 11/12/2019   Procedure: COLONOSCOPY WITH PROPOFOL ;  Surgeon: Aneita Gwendlyn DASEN, MD;  Location: [facility];  Service: Endoscopy;  Laterality: N/A;   GYNECOLOGIC CRYOSURGERY     TUBAL LIGATION     WISDOM TOOTH EXTRACTION  2007   Patient Active Problem List   Diagnosis Date Noted   Primary hypertension 03/09/2024   Family history of stomach cancer 05/08/2023   Vitamin D  deficiency 05/22/2021   Chronic back pain 01/30/2021   S/P tubal ligation 01/30/2021   Menorrhagia 02/15/2020    Generalized weakness 11/09/2019   Lymphocytic colitis 11/08/2019   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/2018   Perennial allergic rhinitis 10/17/2018   Genital herpes simplex 01/31/2017   S/P cesarean section 04/12/2012   Obesity 01/04/2012   Iron  deficiency anemia due to chronic blood loss 01/04/2012    PCP: Roselie Mood MD   REFERRING PROVIDER: Roselie Mood MD   REFERRING DIAG:  Diagnosis  M54.50,G89.29 (ICD-10-CM) - Chronic bilateral low back pain without sciatica    Rationale for Evaluation and Treatment: Rehabilitation  THERAPY DIAG:  Other low back pain  Cramp and spasm  Muscle weakness (generalized)  ONSET DATE:  Worsening LBP for > 10 years   SUBJECTIVE:  SUBJECTIVE STATEMENT: Has a long history of low back pain.  She reports his become progressively worse after series of pregnancies.  Her last pregnancy she had full-term twins.  She reports worsening back pain since that point that was now 5 to 6 years ago.  She also reports when she was a teenager she had a fracture in her pelvis.  She reports intermittent pain in her left hip/pelvic area since that point.  She has been to PT in the past with some improvement in pain.  PERTINENT HISTORY:  Child birth 4x,   PAIN:  Are you having pain? Yes: NPRS scale: 6/10 right now, has reach a 10/10  Pain location: Low back L> R can go down both sides to her feet  Pain description: aching  Aggravating factors: lying down at night  Relieving factors: muscle relaxer, tylenol    PRECAUTIONS: None  RED FLAGS: None   WEIGHT BEARING RESTRICTIONS: No  FALLS:  Has patient fallen in last 6 months? 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  DIAGNOSTIC FINDINGS:  Lumbar x-ray IMPRESSION: No acute fracture or dislocation. Mild degenerative joint changes of the lower lumbar spine.  PATIENT SURVEYS:  Modified ODI 24/50 48% disability   Interpretation of scores: Score Category Description  0-20% Minimal Disability The patient can cope with most living activities. Usually no treatment is indicated apart from advice on lifting, sitting and exercise  21-40% Moderate Disability The patient experiences more pain and difficulty with sitting, lifting and standing. Travel and social life are more difficult and they may be disabled from work. Personal care, sexual activity and sleeping are not grossly affected, and the patient can usually be managed by conservative means  41-60% Severe Disability Pain remains the main problem in this group, but activities of daily living are affected. These patients require a detailed investigation  61-80% Crippled Back pain impinges on all aspects of the patient's life. Positive intervention is required  81-100% Bed-bound  These patients are either bed-bound or exaggerating their symptoms  Bluford FORBES Zoe DELENA Karon DELENA, et al. Surgery versus conservative management of stable thoracolumbar fracture: the PRESTO feasibility RCT. [place] ([place]): [employer]; 2021 Nov. [facility] Technology Assessment, No. 25.62.) Appendix 3, Oswestry Disability Index category descriptors. Available from: FindJewelers.cz  Minimally Clinically Important Difference (MCID) = 12.8%  COGNITION: Overall cognitive status: Within functional limits for tasks assessed     SENSATION: Pain can run down into both legs to her feet    POSTURE:  Lumbar  hyperlordosis  PALPATION: Significant spasming in lower lumbar paraspinals and bilateral gluteals  LUMBAR ROM:   AROM eval  Flexion Pain at end range in lower back   Extension Significant pain past neutral  Right lateral flexion Pain at end range   Left lateral flexion Pain at end rang e  Right rotation Pain at end range on right  Left rotation Pain at end range on left   (Blank rows = not tested)  LOWER EXTREMITY ROM:     Passive  Right eval Left eval  Hip flexion Within normal limits Painful past 90 degrees  Hip extension    Hip abduction  Hip adduction    Hip internal rotation 20 degrees No pain but limited motion compared to right  5 degrees  Hip external rotation Within normal limits Painful endrange  Knee flexion    Knee extension    Ankle dorsiflexion    Ankle plantarflexion    Ankle inversion    Ankle eversion     (Blank rows = not tested)  LOWER EXTREMITY MMT:    MMT Right eval Left eval  Hip flexion 30.3 30.4  Hip extension    Hip abduction 39.1 30.1  Hip adduction    Hip internal rotation    Hip external rotation    Knee flexion    Knee extension 30.4 36.9  Ankle dorsiflexion    Ankle plantarflexion    Ankle inversion    Ankle eversion     (Blank rows = not tested)    GAIT:  TREATMENT DATE:  Manual: Skilled palpation of trigger                                                                                                                                 PATIENT EDUCATION:  Education details: HEP, symptom management, self soft tissue mobilization  Person educated: Patient Education method: Explanation, Demonstration, Tactile cues, Verbal cues, and Handouts Education comprehension: verbalized understanding, returned demonstration, verbal cues required, tactile cues required, and needs further education  HOME EXERCISE PROGRAM: Access Code: [email or web address] URL: [email or web address] Date: 04/27/2024 Prepared by: Alm Don  Exercises - Supine Piriformis Stretch with Foot on Ground  - 1 x daily - 7 x weekly - 3 sets - 3 reps - 20sec hold - Standing Glute Med Mobilization with Small Ball on Wall  - 1 x daily - 7 x weekly - 3 sets - 1 min  hold - Seated Bilateral Shoulder Flexion Towel Slide at Table Top  - 1 x daily - 7 x weekly - 3 sets - 5-10 sec  hold  ASSESSMENT:  CLINICAL IMPRESSION: Patient is a 48 year old female with a long history of low back pain.  Her pain gait has become progressively a lot worse with a series of pregnancies.  Over the past few years the pain is affecting her ability to sleep and perform ADLs.  Her x-ray shows mild lumbar facet joint degeneration.  She has increased pain with endrange of all lumbar movement but a particular increase in pain with lumbar extension.  She has significant spasming in her lower lumbar paraspinals and bilateral gluteals.  She has a lumbar hyperlordosis.  She has an old injury from high school in her left hip.  She has decreased left hip mobility in flexion, external rotation, and internal rotation.  She would benefit from skilled therapy to increase lower extremity strength, decrease muscle spasms in the low back, and improve general functional mobility.  OBJECTIVE IMPAIRMENTS: Abnormal gait, decreased activity tolerance, decreased ROM, decreased strength, increased muscle spasms, and pain.   ACTIVITY LIMITATIONS: carrying,  lifting, sitting, standing, squatting, sleeping, stairs, transfers, and locomotion level  PARTICIPATION LIMITATIONS: meal prep, cleaning, laundry, driving, shopping, community activity, and occupation  PERSONAL FACTORS: 1-2 comorbidities: multiple pregnancy's, left hip fx as a teenager are also affecting patient's functional outcome. Time since onset    REHAB POTENTIAL: Good  CLINICAL DECISION MAKING: Evolving/moderate complexity  EVALUATION COMPLEXITY: Moderate   GOALS: Goals reviewed with patient? Yes  SHORT TERM GOALS:  Target date: 05/25/2024    Patient will increase gross bilateral LE strength to 5/5  Baseline: Goal status: INITIAL  2.  The patient will reports a 50% reduction  Baseline:  Goal status: INITIAL  3.  Patient will be independent with base exercises program Baseline:  Goal status: INITIAL  4.  Patient will report no radicular pain  Baseline:  Goal status: INITIAL   LONG TERM GOALS: Target date: 06/22/2024    Patient will go up/down 8 steps without pain in order to get into her apartment.  Baseline:  Goal status: INITIAL  2.  Patient will sleep through the night without pain  Baseline:  Goal status: INITIAL  3.  Patient will stand for > 30 min in order to perform daily tasks  Baseline:  Goal status: INITIAL  PLAN:  PT FREQUENCY: 2x/week  PT DURATION: 8 weeks  PLANNED INTERVENTIONS: [id number]-Therapeutic exercises, 97530- Therapeutic activity, [id number]- Neuromuscular re-education, 97535- Self Care, [id number]- Manual therapy, [id number]- Gait training, [phone]- Aquatic Therapy, [id number]- Electrical stimulation (unattended), 97035- Ultrasound, Patient/Family education, Stair training, Taping, Dry Needling, DME instructions, Cryotherapy, and Moist heat .  PLAN FOR NEXT SESSION:  Aquatics: begin core stability exercises and hip strengthening  Land: trigger point release to lumbar spine and hips, Consider LAD to left hip but patient has pain lying flat. She may need pillows. Progress core stability exercises. We may want to start with a sitting series. She doesn't do well lying flat and standing might be too much.    Alm JINNY Don, PT 04/27/2024, 4:39 PM

## 2024-04-28 ENCOUNTER — Encounter (HOSPITAL_BASED_OUTPATIENT_CLINIC_OR_DEPARTMENT_OTHER): Payer: Self-pay | Admitting: Physical Therapy

## 2024-05-12 ENCOUNTER — Ambulatory Visit (HOSPITAL_BASED_OUTPATIENT_CLINIC_OR_DEPARTMENT_OTHER): Attending: Nurse Practitioner | Admitting: Physical Therapy

## 2024-05-12 ENCOUNTER — Encounter (HOSPITAL_BASED_OUTPATIENT_CLINIC_OR_DEPARTMENT_OTHER): Payer: Self-pay | Admitting: Physical Therapy

## 2024-05-12 DIAGNOSIS — M6281 Muscle weakness (generalized): Secondary | ICD-10-CM | POA: Diagnosis present

## 2024-05-12 DIAGNOSIS — M5459 Other low back pain: Secondary | ICD-10-CM | POA: Insufficient documentation

## 2024-05-12 DIAGNOSIS — R252 Cramp and spasm: Secondary | ICD-10-CM | POA: Insufficient documentation

## 2024-05-12 NOTE — Therapy (Signed)
 OUTPATIENT PHYSICAL THERAPY THORACOLUMBAR TREATMENT    Patient Name: Erika David MRN: [id number] DOB:March 13, 1976, 48 y.o., female Today's Date: 05/12/2024  END OF SESSION:  PT End of Session - 05/12/24 0811     Visit Number 2    Number of Visits 16    Date for PT Re-Evaluation 06/22/24    Authorization Type BCBS    PT Start Time 0809   late arrival   PT Stop Time 0840    PT Time Calculation (min) 31 min    Activity Tolerance Patient tolerated treatment well    Behavior During Therapy [facility] for tasks assessed/performed           Past Medical History:  Diagnosis Date   Abnormal Pap smear     LEEP?  LAST PAP 05/2009   Acute respiratory failure due to COVID-19 (HCC) 01/27/2020   Anemia 01/04/2012   Chlamydia    H/O candidiasis    H/O dysmenorrhea 03/13/02   H/O gonorrhea    treated   H/O menorrhagia 07/09/02   Infection 2006   GONORHRREA   Infection 2006   CHLAMYDIA   Infection    FREQ YEAST   Pelvic injury AS TEEN   SPORTS RELATED   Pelvic pain 07/11/10   Sterilization 01/04/2012   Desires PP BTL Tubal papers signed    Trichomonas    Yeast infection    Past Surgical History:  Procedure Laterality Date   BIOPSY  11/12/2019   Procedure: BIOPSY;  Surgeon: Aneita Gwendlyn DASEN, MD;  Location: [facility];  Service: Endoscopy;;   CESAREAN SECTION  04/11/2012   Procedure: CESAREAN SECTION;  Surgeon: Jon CINDERELLA Rummer, MD;  Location: [facility];  Service: Gynecology;  Laterality: N/A;  twins   COLONOSCOPY WITH PROPOFOL  N/A 11/12/2019   Procedure: COLONOSCOPY WITH PROPOFOL ;  Surgeon: Aneita Gwendlyn DASEN, MD;  Location: [facility];  Service: Endoscopy;  Laterality: N/A;   GYNECOLOGIC CRYOSURGERY     TUBAL LIGATION     WISDOM TOOTH EXTRACTION  2007   Patient Active Problem List   Diagnosis Date Noted   Primary hypertension 03/09/2024   Family history of stomach cancer 05/08/2023   Vitamin D  deficiency 05/22/2021   Chronic back pain 01/30/2021   S/P tubal ligation 01/30/2021    Menorrhagia 02/15/2020   Generalized weakness 11/09/2019   Lymphocytic colitis 11/08/2019   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/2018   Perennial allergic rhinitis 10/17/2018   Genital herpes simplex 01/31/2017   S/P cesarean section 04/12/2012   Obesity 01/04/2012   Iron  deficiency anemia due to chronic blood loss 01/04/2012    PCP: Roselie Mood MD   REFERRING PROVIDER: Roselie Mood MD   REFERRING DIAG:  Diagnosis  M54.50,G89.29 (ICD-10-CM) - Chronic bilateral low back pain without sciatica    Rationale for Evaluation and Treatment: Rehabilitation  THERAPY DIAG:  Other low back pain  Cramp and spasm  Muscle weakness (generalized)  ONSET DATE:  Worsening LBP for > 10 years   SUBJECTIVE:  SUBJECTIVE STATEMENT:   Nothing new, same pain and same stuff going on. Can't take mm relaxers when working, can only take them at night       EVAL: Has a long history of low back pain.  She reports his become progressively worse after series of pregnancies.  Her last pregnancy she had full-term twins.  She reports worsening back pain since that point that was now 5 to 6 years ago.  She also reports when she was a teenager she had a fracture in her pelvis.  She reports intermittent pain in her left hip/pelvic area since that point.  She has been to PT in the past with some improvement in pain.  PERTINENT HISTORY:  Child birth 4x,   PAIN:  Are you having pain? Yes: NPRS scale: 7/10 right now, has reach a 10/10 at the worst  Pain location: Low back L> R can go down both sides to her feet  Pain description: pinching pressure  Aggravating factors: lying down at night  Relieving factors: muscle relaxer, tylenol    PRECAUTIONS: None  RED FLAGS: None   WEIGHT BEARING RESTRICTIONS: No  FALLS:  Has  patient fallen in last 6 months? 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  DIAGNOSTIC FINDINGS:  Lumbar x-ray IMPRESSION: No acute fracture or dislocation. Mild degenerative joint changes of the lower lumbar spine.  PATIENT SURVEYS:  Modified ODI 24/50 48% disability   Interpretation of scores: Score Category Description  0-20% Minimal Disability The patient can cope with most living activities. Usually no treatment is indicated apart from advice on lifting, sitting and exercise  21-40% Moderate Disability The patient experiences more pain and difficulty with sitting, lifting and standing. Travel and social life are more difficult and they may be disabled from work. Personal care, sexual activity and sleeping are not grossly affected, and the patient can usually be managed by conservative means  41-60% Severe Disability Pain remains the main problem in this group, but activities of daily living are affected. These patients require a detailed investigation  61-80% Crippled Back pain impinges on all aspects of the patient's life. Positive intervention is required  81-100% Bed-bound  These patients are either bed-bound or exaggerating their symptoms  Bluford FORBES Zoe DELENA Karon DELENA, et al. Surgery versus conservative management of stable thoracolumbar fracture: the PRESTO feasibility RCT. [place]): [employer]; 2021 Nov. [facility] Technology Assessment, No. 25.62.) Appendix 3, Oswestry Disability Index category descriptors. Available from: FindJewelers.cz  Minimally Clinically Important Difference (MCID) =  12.8%  COGNITION: Overall cognitive status: Within functional limits for tasks assessed     SENSATION: Pain can run down into both legs to her feet    POSTURE:  Lumbar hyperlordosis  PALPATION: Significant spasming in lower lumbar paraspinals and bilateral gluteals  LUMBAR ROM:   AROM eval  Flexion Pain at end range in lower back   Extension Significant pain past neutral  Right lateral flexion Pain at end range   Left lateral flexion Pain at end rang e  Right rotation Pain at end range on right  Left rotation Pain at end range on left   (Blank rows = not  tested)  LOWER EXTREMITY ROM:     Passive  Right eval Left eval  Hip flexion Within normal limits Painful past 90 degrees  Hip extension    Hip abduction    Hip adduction    Hip internal rotation 20 degrees No pain but limited motion compared to right  5 degrees  Hip external rotation Within normal limits Painful endrange  Knee flexion    Knee extension    Ankle dorsiflexion    Ankle plantarflexion    Ankle inversion    Ankle eversion     (Blank rows = not tested)  LOWER EXTREMITY MMT:    MMT Right eval Left eval  Hip flexion 30.3 30.4  Hip extension    Hip abduction 39.1 30.1  Hip adduction    Hip internal rotation    Hip external rotation    Knee flexion    Knee extension 30.4 36.9  Ankle dorsiflexion    Ankle plantarflexion    Ankle inversion    Ankle eversion     (Blank rows = not tested)    GAIT:  TREATMENT DATE:   05/12/24       Attempted hook-lying PPT- unable pain 8-9/10  Seated TA set 15x3 seconds Seated TA set + march x10  Lateral trunk crunches + TA set x10 B Seated piriformis stretch 2x30 seconds Forward QL stretch 3x30 seconds  Seated lumbar rotation stretches 5x5 seconds B  Standing hip ABD red TB 2x10 B Standing hip extension red TB 2x10 B STS red TB around knees x10  Hip hikes x10 B Hip hikes + ABD x10 B L stretch at counter 2x30 seconds                                                                                                                       PATIENT EDUCATION:  Education details: HEP, symptom management, self soft tissue mobilization  Person educated: Patient Education method: Explanation, Demonstration, Tactile cues, Verbal cues, and Handouts Education comprehension: verbalized understanding, returned demonstration, verbal cues required, tactile cues required, and needs further education  HOME EXERCISE PROGRAM: Access Code: [email or web address] URL: [email or web address] Date: 04/27/2024 Prepared by: Alm Don  Exercises - Supine Piriformis Stretch with Foot on Ground  - 1 x daily - 7 x weekly - 3 sets - 3 reps - 20sec hold - Standing Glute Med Mobilization with Small Ball on Wall  - 1 x daily - 7 x weekly - 3 sets - 1 min  hold - Seated Bilateral Shoulder Flexion Towel Slide at Table Top  - 1 x daily - 7 x weekly - 3 sets - 5-10 sec  hold  ASSESSMENT:  CLINICAL IMPRESSION:  Arrived a little late today. We did cautiously try some hook-lying exercises but she was unable due to sharp increase in pain to 8-9/10. Focused session on core mm activation and hip strengthening and stretching/mobility as tolerated.    EVAL: Patient is a 48 year old female with a long history of low back pain.  Her pain gait has become progressively a lot worse with a series of pregnancies.  Over the past few years the pain is affecting her ability to sleep and perform ADLs.  Her x-ray shows mild lumbar facet joint degeneration.  She has increased pain with endrange of all lumbar movement but a particular increase in pain with lumbar extension.  She has significant spasming in her lower lumbar paraspinals and bilateral gluteals.  She has a lumbar hyperlordosis.  She has an old injury from high school in her left hip.  She has decreased left hip mobility in flexion, external rotation, and internal rotation.  She would benefit from skilled therapy to increase  lower extremity strength, decrease muscle spasms in the low back, and improve general functional mobility.  OBJECTIVE IMPAIRMENTS: Abnormal gait, decreased activity tolerance, decreased ROM, decreased strength, increased muscle spasms, and pain.   ACTIVITY LIMITATIONS: carrying, lifting, sitting, standing, squatting, sleeping, stairs, transfers, and locomotion level  PARTICIPATION LIMITATIONS: meal prep, cleaning, laundry, driving, shopping, community activity, and occupation  PERSONAL FACTORS: 1-2 comorbidities: multiple pregnancy's, left hip fx as a teenager are also affecting patient's functional outcome. Time since onset    REHAB POTENTIAL: Good  CLINICAL DECISION MAKING: Evolving/moderate complexity  EVALUATION COMPLEXITY: Moderate   GOALS: Goals reviewed with patient? Yes  SHORT TERM GOALS: Target date: 05/25/2024    Patient will increase gross bilateral LE strength to 5/5  Baseline: Goal status: INITIAL  2.  The patient will reports a 50% reduction  Baseline:  Goal status: INITIAL  3.  Patient will be independent with base exercises program Baseline:  Goal status: INITIAL  4.  Patient will report no radicular pain  Baseline:  Goal status: INITIAL   LONG TERM GOALS: Target date: 06/22/2024    Patient will go up/down 8 steps without pain in order to get into her apartment.  Baseline:  Goal status: INITIAL  2.  Patient will sleep through the night without pain  Baseline:  Goal status: INITIAL  3.  Patient will stand for > 30 min in order to perform daily tasks  Baseline:  Goal status: INITIAL  PLAN:  PT FREQUENCY: 2x/week  PT DURATION: 8 weeks  PLANNED INTERVENTIONS: 97110-Therapeutic exercises, 97530- Therapeutic activity, [id number]- Neuromuscular re-education, 97535- Self Care, [id number]- Manual therapy, [id number]- Gait training, [phone]- Aquatic Therapy, 97014- Electrical stimulation (unattended), 97035- Ultrasound, Patient/Family education, Stair training,  Taping, Dry Needling, DME instructions, Cryotherapy, and Moist heat .  PLAN FOR NEXT SESSION:   Aquatics: begin core stability exercises and hip strengthening  Land: trigger point release to lumbar spine and hips, Consider LAD to left hip but patient has pain lying flat. She may need pillows. Progress core stability exercises. Focus on seated exercises, could not tolerate hook-lying last tx session    Josette Rough, PT, DPT 05/12/24 8:41 AM

## 2024-05-15 ENCOUNTER — Ambulatory Visit (HOSPITAL_BASED_OUTPATIENT_CLINIC_OR_DEPARTMENT_OTHER): Admitting: Physical Therapy

## 2024-05-15 ENCOUNTER — Telehealth (HOSPITAL_BASED_OUTPATIENT_CLINIC_OR_DEPARTMENT_OTHER): Payer: Self-pay | Admitting: Physical Therapy

## 2024-05-15 NOTE — Telephone Encounter (Signed)
 No-show for today's PT appt. Called pt, she reports she had a training at work today and forgot appt. Reminded her of date/time of next scheduled visit.  Josette Rough, PT, DPT 05/15/24 8:20 AM

## 2024-05-18 ENCOUNTER — Encounter: Payer: Self-pay | Admitting: Nurse Practitioner

## 2024-05-18 ENCOUNTER — Other Ambulatory Visit: Payer: Self-pay

## 2024-05-18 ENCOUNTER — Other Ambulatory Visit: Payer: Self-pay | Admitting: Nurse Practitioner

## 2024-05-18 ENCOUNTER — Encounter: Admitting: Nurse Practitioner

## 2024-05-18 VITALS — BP 134/82 | HR 95 | Temp 97.3°F | Ht 61.5 in | Wt 202.6 lb

## 2024-05-18 DIAGNOSIS — E66812 Obesity, class 2: Secondary | ICD-10-CM

## 2024-05-18 DIAGNOSIS — I10 Essential (primary) hypertension: Secondary | ICD-10-CM

## 2024-05-18 DIAGNOSIS — Z6837 Body mass index (BMI) 37.0-37.9, adult: Secondary | ICD-10-CM

## 2024-05-18 NOTE — Assessment & Plan Note (Signed)
  Wt Readings from Last 3 Encounters:  05/18/24 202 lb 9.6 oz (91.9 kg)  04/13/24 200 lb 9.6 oz (91 kg)  03/09/24 204 lb 9.6 oz (92.8 kg)

## 2024-05-18 NOTE — Telephone Encounter (Signed)
 Called patient's pharmacy and asked about if a PA was needed for Zepbound . Was told to let her run it for she can only tell me if its ran in the system. She informed me that a PA is needed and that she will have it faxed to our office with what is says int he system and the need for the PA.

## 2024-05-19 ENCOUNTER — Telehealth: Payer: Self-pay

## 2024-05-19 ENCOUNTER — Other Ambulatory Visit (HOSPITAL_COMMUNITY): Payer: Self-pay

## 2024-05-19 NOTE — Telephone Encounter (Signed)
 Pharmacy Patient Advocate Encounter   Insurance verification completed.   The patient is insured through [facility]    Ran test claim for ZEPBOUND . Currently a quantity of 2ml is a 28 day supply and the Rx is Not covered by the patients plans. The plan only covers The drug when the patient has a Dx of OSA.                This test claim was processed through [facility]- copay amounts may vary at other pharmacies due to pharmacy/plan contracts, or as the patient moves through the different stages of their insurance plan.

## 2024-05-19 NOTE — Telephone Encounter (Signed)
 PA request sent to PA team

## 2024-05-19 NOTE — Telephone Encounter (Signed)
 Incoming refill request with comments from patient's pharmacy for Zepbound  2.5 mg. Pharmacy comment: Alternative Requested:PRIOR AUTHORIZATION REQUIRED.

## 2024-05-19 NOTE — Progress Notes (Signed)
 Encounter canceled due to lack of medication. PA needed. This encounter was created in error - please disregard.

## 2024-05-19 NOTE — Telephone Encounter (Signed)
 Called and left a voice message per DPR on file asking patient to give me a call back at the office at [phone] it's pertaining to her Zepbound  medication.

## 2024-05-20 ENCOUNTER — Telehealth (HOSPITAL_BASED_OUTPATIENT_CLINIC_OR_DEPARTMENT_OTHER): Payer: Self-pay | Admitting: Physical Therapy

## 2024-05-20 ENCOUNTER — Ambulatory Visit (HOSPITAL_BASED_OUTPATIENT_CLINIC_OR_DEPARTMENT_OTHER): Admitting: Physical Therapy

## 2024-05-20 NOTE — Telephone Encounter (Signed)
 Called patient and informed her of the results of the PA. I also asked if she would be interested in a referral to weight management clinic. Patient stated that she is not interested in going in to see another doctor. Patient asked me if her insurance will not cover any weight loss medication. I advised her that she will need to call her insurance company to ask that and that I can only tell her about Zepbound  at the moment. She said she will call her insurance and/or job to find out what weight loss medication they cover. I asked to send the information via MyChart once she is informed. She thanked me for calling

## 2024-05-20 NOTE — Telephone Encounter (Signed)
 Patient was contacted regarding second consecutive no-show appointment for physical therapy.  The patient reports she had her days mixed up.  Per attendance policy we will keep her next appointment.  She is advised of her next appointment on Friday at 4:00.  We will cancel subsequent visits.  The patient will be able to schedule 1 visit at a time at this time.  She was advised that she has another no-show we will have to discharge.

## 2024-05-22 ENCOUNTER — Ambulatory Visit (HOSPITAL_BASED_OUTPATIENT_CLINIC_OR_DEPARTMENT_OTHER): Admitting: Physical Therapy

## 2024-05-22 NOTE — Telephone Encounter (Signed)
 Patient's insurance will not cover unless patient has a Dx of OSA. Patient made aware see PA telephone encounter.

## 2024-05-22 NOTE — Telephone Encounter (Addendum)
 Called and left a detailed voice message for patient per DPR on file of Erika David's comment about not having a substitute for Zepbound . Asked to give me a call back at the office at [phone] if she has any questions or to send a MyChart message.

## 2024-05-26 ENCOUNTER — Encounter (HOSPITAL_BASED_OUTPATIENT_CLINIC_OR_DEPARTMENT_OTHER): Admitting: Physical Therapy

## 2024-05-30 ENCOUNTER — Encounter (HOSPITAL_BASED_OUTPATIENT_CLINIC_OR_DEPARTMENT_OTHER): Admitting: Physical Therapy

## 2024-06-03 ENCOUNTER — Ambulatory Visit (HOSPITAL_BASED_OUTPATIENT_CLINIC_OR_DEPARTMENT_OTHER): Admitting: Physical Therapy

## 2024-06-06 ENCOUNTER — Encounter (HOSPITAL_BASED_OUTPATIENT_CLINIC_OR_DEPARTMENT_OTHER): Admitting: Physical Therapy

## 2024-06-09 ENCOUNTER — Encounter (HOSPITAL_BASED_OUTPATIENT_CLINIC_OR_DEPARTMENT_OTHER): Admitting: Physical Therapy

## 2024-06-13 ENCOUNTER — Encounter (HOSPITAL_BASED_OUTPATIENT_CLINIC_OR_DEPARTMENT_OTHER): Admitting: Physical Therapy

## 2024-08-04 ENCOUNTER — Telehealth: Payer: Self-pay | Admitting: Hematology and Oncology

## 2024-08-04 NOTE — Telephone Encounter (Signed)
 I left voicemail for patient to return my call if date/time for rescheduled appointment with Dr. Loretha does not work for her.

## 2024-08-24 ENCOUNTER — Other Ambulatory Visit

## 2024-08-24 ENCOUNTER — Ambulatory Visit: Admitting: Hematology and Oncology

## 2024-09-08 DIAGNOSIS — D500 Iron deficiency anemia secondary to blood loss (chronic): Secondary | ICD-10-CM

## 2024-09-08 DIAGNOSIS — E559 Vitamin D deficiency, unspecified: Secondary | ICD-10-CM

## 2024-09-09 ENCOUNTER — Inpatient Hospital Stay: Attending: Hematology and Oncology

## 2024-09-09 ENCOUNTER — Inpatient Hospital Stay: Admitting: Hematology and Oncology

## 2024-09-09 ENCOUNTER — Inpatient Hospital Stay

## 2024-09-09 VITALS — BP 130/88 | HR 97 | Temp 98.4°F | Resp 18 | Ht 61.5 in | Wt 202.9 lb

## 2024-09-09 DIAGNOSIS — D500 Iron deficiency anemia secondary to blood loss (chronic): Secondary | ICD-10-CM | POA: Diagnosis not present

## 2024-09-09 DIAGNOSIS — Z803 Family history of malignant neoplasm of breast: Secondary | ICD-10-CM | POA: Insufficient documentation

## 2024-09-09 DIAGNOSIS — Z800 Family history of malignant neoplasm of digestive organs: Secondary | ICD-10-CM | POA: Insufficient documentation

## 2024-09-09 DIAGNOSIS — D509 Iron deficiency anemia, unspecified: Secondary | ICD-10-CM | POA: Diagnosis present

## 2024-09-09 DIAGNOSIS — R5383 Other fatigue: Secondary | ICD-10-CM | POA: Diagnosis not present

## 2024-09-09 DIAGNOSIS — N920 Excessive and frequent menstruation with regular cycle: Secondary | ICD-10-CM | POA: Diagnosis not present

## 2024-09-09 DIAGNOSIS — R0609 Other forms of dyspnea: Secondary | ICD-10-CM | POA: Diagnosis not present

## 2024-09-09 DIAGNOSIS — F5089 Other specified eating disorder: Secondary | ICD-10-CM | POA: Insufficient documentation

## 2024-09-09 DIAGNOSIS — Z79899 Other long term (current) drug therapy: Secondary | ICD-10-CM | POA: Diagnosis not present

## 2024-09-09 LAB — CMP (CANCER CENTER ONLY)
ALT: 11 U/L (ref 0–44)
AST: 16 U/L (ref 15–41)
Albumin: 4.3 g/dL (ref 3.5–5.0)
Alkaline Phosphatase: 101 U/L (ref 38–126)
Anion gap: 10 (ref 5–15)
BUN: 8 mg/dL (ref 6–20)
CO2: 24 mmol/L (ref 22–32)
Calcium: 8.8 mg/dL — ABNORMAL LOW (ref 8.9–10.3)
Chloride: 105 mmol/L (ref 98–111)
Creatinine: 0.93 mg/dL (ref 0.44–1.00)
GFR, Estimated: >60 mL/min
Glucose, Bld: 104 mg/dL — ABNORMAL HIGH (ref 70–99)
Potassium: 3.8 mmol/L (ref 3.5–5.1)
Sodium: 139 mmol/L (ref 135–145)
Total Bilirubin: 0.2 mg/dL (ref 0.0–1.2)
Total Protein: 7.2 g/dL (ref 6.5–8.1)

## 2024-09-09 LAB — CBC WITH DIFFERENTIAL (CANCER CENTER ONLY)
Abs Immature Granulocytes: 0 K/uL (ref 0.00–0.07)
Basophils Absolute: 0.1 K/uL (ref 0.0–0.1)
Basophils Relative: 1 %
Eosinophils Absolute: 0.2 K/uL (ref 0.0–0.5)
Eosinophils Relative: 5 %
HCT: 33.3 % — ABNORMAL LOW (ref 36.0–46.0)
Hemoglobin: 10.9 g/dL — ABNORMAL LOW (ref 12.0–15.0)
Immature Granulocytes: 0 %
Lymphocytes Relative: 40 %
Lymphs Abs: 1.7 K/uL (ref 0.7–4.0)
MCH: 25.2 pg — ABNORMAL LOW (ref 26.0–34.0)
MCHC: 32.7 g/dL (ref 30.0–36.0)
MCV: 77.1 fL — ABNORMAL LOW (ref 80.0–100.0)
Monocytes Absolute: 0.5 K/uL (ref 0.1–1.0)
Monocytes Relative: 10 %
Neutro Abs: 1.9 K/uL (ref 1.7–7.7)
Neutrophils Relative %: 44 %
Platelet Count: 306 K/uL (ref 150–400)
RBC: 4.32 MIL/uL (ref 3.87–5.11)
RDW: 14.7 % (ref 11.5–15.5)
WBC Count: 4.3 K/uL (ref 4.0–10.5)
nRBC: 0 % (ref 0.0–0.2)

## 2024-09-09 LAB — FERRITIN: Ferritin: 8 ng/mL — ABNORMAL LOW (ref 11–307)

## 2024-09-09 LAB — IRON AND IRON BINDING CAPACITY (CC-WL,HP ONLY)
Iron: 27 ug/dL — ABNORMAL LOW (ref 28–170)
Saturation Ratios: 6 % — ABNORMAL LOW (ref 10.4–31.8)
TIBC: 430 ug/dL (ref 250–450)
UIBC: 403 ug/dL

## 2024-09-09 NOTE — Progress Notes (Signed)
 [facility] FOLLOW UP NOTE  Patient Care Team: Nche, Roselie Rockford, NP as PCP - General (Internal Medicine) Loretha Ash, MD as Consulting Physician (Hematology and Oncology)  CHIEF COMPLAINTS/PURPOSE OF CONSULTATION:  IDA  ASSESSMENT & PLAN:  Assessment and Plan Assessment & Plan Iron  deficiency anemia Hemoglobin 10.9,      Follow-up in 6 months with repeat labs.   HISTORY OF PRESENTING ILLNESS:  Erika David 48 y.o. female is here because of IDA  Oncology History   No problem history exists.   History of Present Illness    The patient, with a history of iron  deficiency anemia, colitis, and heavy menstruation, presents for follow up.  Discussed the use of AI scribe software for clinical note transcription with the patient, who gave verbal consent to proceed.  History of Present Illness Erika David is a 48 year old female with iron  deficiency anemia who presents for follow-up   MEDICAL HISTORY:  Past Medical History:  Diagnosis Date   Abnormal Pap smear     LEEP?  LAST PAP 05/2009   Acute respiratory failure due to COVID-19 (HCC) 01/27/2020   Anemia 01/04/2012   Chlamydia    H/O candidiasis    H/O dysmenorrhea 03/13/02   H/O gonorrhea    treated   H/O menorrhagia 07/09/02   Infection 2006   GONORHRREA   Infection 2006   CHLAMYDIA   Infection    FREQ YEAST   Pelvic injury AS TEEN   SPORTS RELATED   Pelvic pain 07/11/10   Sterilization 01/04/2012   Desires PP BTL Tubal papers signed    Trichomonas    Yeast infection     SURGICAL HISTORY: Past Surgical History:  Procedure Laterality Date   BIOPSY  11/12/2019   Procedure: BIOPSY;  Surgeon: Aneita Gwendlyn DASEN, MD;  Location: [facility];  Service: Endoscopy;;   CESAREAN SECTION  04/11/2012   Procedure: CESAREAN SECTION;  Surgeon: Jon CINDERELLA Rummer, MD;  Location: [facility];  Service: Gynecology;  Laterality: N/A;  twins   COLONOSCOPY WITH PROPOFOL  N/A 11/12/2019   Procedure: COLONOSCOPY WITH  PROPOFOL ;  Surgeon: Aneita Gwendlyn DASEN, MD;  Location: [facility];  Service: Endoscopy;  Laterality: N/A;   GYNECOLOGIC CRYOSURGERY     TUBAL LIGATION     WISDOM TOOTH EXTRACTION  2007    SOCIAL HISTORY: Social History   Socioeconomic History   Marital status: Single    Spouse name: Not on file   Number of children: 3   Years of education: 16   Highest education level: Not on file  Occupational History   Occupation: CNA  Tobacco Use   Smoking status: Never   Smokeless tobacco: Never  Vaping Use   Vaping status: Never Used  Substance and Sexual Activity   Alcohol use: Yes    Comment: occasional alcohol   Drug use: No   Sexual activity: Yes    Birth control/protection: Surgical    Comment: btl  Other Topics Concern   Not on file  Social History Narrative   Not on file   Social Drivers of Health   Tobacco Use: Low Risk (05/18/2024)   Patient History    Smoking Tobacco Use: Never    Smokeless Tobacco Use: Never    Passive Exposure: Not on file  Financial Resource Strain: Not on file  Food Insecurity: Not on file  Transportation Needs: Not on file  Physical Activity: Not on file  Stress: Not on file  Social Connections: Not on  file  Intimate Partner Violence: Not on file  Depression (PHQ2-9): Low Risk (04/18/2023)   Depression (PHQ2-9)    PHQ-2 Score: 0  Alcohol Screen: Not on file  Housing: Not on file  Utilities: Not on file  Health Literacy: Not on file    FAMILY HISTORY: Family History  Problem Relation Age of Onset   Hypertension Mother    Cancer Mother 38       BREAST   Thyroid  disease Mother    Diabetes Father    Hypertension Sister    Liver disease Sister    Lupus Maternal Aunt    Alcohol abuse Maternal Aunt    Drug abuse Maternal Aunt    Cancer Maternal Grandmother 10       Stomach cancer   Hypertension Maternal Grandmother    Diabetes Maternal Grandmother    Heart disease Maternal Grandfather    Stroke Maternal Grandfather    Asthma Cousin     Anesthesia problems Neg Hx     ALLERGIES:  is allergic to bee pollen, dust mite extract, kiwi extract, pollen extract, other, soy allergy (obsolete), and venofer  [iron  sucrose].  MEDICATIONS:  Current Outpatient Medications  Medication Sig Dispense Refill   acetaminophen  (TYLENOL ) 500 MG tablet Take 2 tablets (1,000 mg total) by mouth every 8 (eight) hours as needed.     albuterol  (VENTOLIN  HFA) 108 (90 Base) MCG/ACT inhaler Inhale 2 puffs into the lungs every 6 (six) hours as needed for wheezing or shortness of breath. 8 g 0   Cetirizine HCl (ZYRTEC PO) Take by mouth.     cyclobenzaprine  (FLEXERIL ) 10 MG tablet Take 1 tablet (10 mg total) by mouth at bedtime. 30 tablet 5   omeprazole  (PRILOSEC) 20 MG capsule Take 1 capsule (20 mg total) by mouth daily. 15 capsule 0   triamterene -hydrochlorothiazide (MAXZIDE-25) 37.5-25 MG tablet Take 0.5 tablets by mouth daily. 45 tablet 1   Vitamin D , Ergocalciferol , (DRISDOL ) 1.25 MG (50000 UNIT) CAPS capsule Take 1 capsule (50,000 Units total) by mouth every 7 (seven) days. Then take 1000 units daily 12 capsule 0   ZEPBOUND  2.5 MG/0.5ML Pen Inject 2.5 mg into the skin once a week. (Patient not taking: Reported on 09/09/2024) 2 mL 0   No current facility-administered medications for this visit.      PHYSICAL EXAMINATION: ECOG PERFORMANCE STATUS: 0 - Asymptomatic  Vitals:   09/09/24 1100  BP: 130/88  Pulse: 97  Resp: 18  Temp: 98.4 F (36.9 C)  SpO2: 97%   Filed Weights   09/09/24 1100  Weight: 202 lb 14.4 oz (92 kg)      LABORATORY DATA:  I have reviewed the data as listed Lab Results  Component Value Date   WBC 4.3 09/09/2024   HGB 10.9 (L) 09/09/2024   HCT 33.3 (L) 09/09/2024   MCV 77.1 (L) 09/09/2024   PLT 306 09/09/2024     Chemistry      Component Value Date/Time   NA 139 09/09/2024 1118   K 3.8 09/09/2024 1118   CL 105 09/09/2024 1118   CO2 24 09/09/2024 1118   BUN 8 09/09/2024 1118   CREATININE 0.93 09/09/2024  1118   CREATININE 0.89 05/29/2013 1403      Component Value Date/Time   CALCIUM  8.8 (L) 09/09/2024 1118   ALKPHOS 101 09/09/2024 1118   AST 16 09/09/2024 1118   ALT 11 09/09/2024 1118   BILITOT 0.2 09/09/2024 1118       RADIOGRAPHIC STUDIES: I have personally reviewed  the radiological images as listed and agreed with the findings in the report. No results found.  All questions were answered. The patient knows to call the clinic with any problems, questions or concerns.     Amber Stalls, MD 09/09/2024 12:14 PM

## 2024-09-09 NOTE — Progress Notes (Signed)
 [facility] FOLLOW UP NOTE  Patient Care Team: Nche, Roselie Rockford, NP as PCP - General (Internal Medicine) Loretha Ash, MD as Consulting Physician (Hematology and Oncology)  CHIEF COMPLAINTS/PURPOSE OF CONSULTATION:  IDA   Assessment & Plan  Iron  deficiency anemia Mild, persistent iron  deficiency anemia due to menorrhagia from uterine fibroids. Symptoms include pica and exertional dyspnea. Hemoglobin at 10.9 g/dL with low iron  saturation. - Initiated oral iron  supplementation ferrous sulfate 65 mg every other day for 2-3 months. - Advised adherence to iron  therapy to improve hemoglobin and symptoms. - Scheduled follow-up in 6 months to reassess anemia.  Uterine fibroids with menorrhagia Symptomatic uterine fibroids causing heavy menstrual bleeding and contributing to iron  deficiency anemia. Completed childbearing and planning hysterectomy.  - Supported plan for hysterectomy as definitive management. - Acknowledged recent gynecologic evaluation and biopsy findings.  Follow-up in 6 months with repeat labs.   HISTORY OF PRESENTING ILLNESS:  Erika David 48 y.o. female is here because of IDA  Oncology History   No problem history exists.   History of Present Illness    The patient, with a history of iron  deficiency anemia, colitis, and heavy menstruation, presents for follow up.  Discussed the use of AI scribe software for clinical note transcription with the patient, who gave verbal consent to proceed.  History of Present Illness  Erika David is a 48 year old female with iron  deficiency anemia secondary to symptomatic uterine fibroids who presents for follow-up of persistent anemia.  She continues to experience abnormal uterine bleeding characterized by increased frequency, heavy menstrual flow, and passage of clots. She had two menstrual cycles in December and is currently menstruating. Uterine biopsy revealed two centrally located fibroids, and she is  coordinating a hysterectomy with her gynecologist, pending financial arrangements. She has completed childbearing and is prepared to proceed with surgical intervention.  Her iron  deficiency anemia persists, with a hemoglobin of 10.9 g/dL and low iron  saturation. She experiences pica, specifically cravings for cornstarch and other non-nutritive substances. She has not been taking oral iron  supplements due to concerns about constipation, despite prior recommendations. She reports exertional dyspnea, fatigue, and low energy. She has no history of gastrointestinal surgery aside from prior tubal ligation.  She describes significant pain in her back and feet, which limits her ability to exercise and participate in physical therapy due to work and family responsibilities. She previously used Zepbound  for weight loss but discontinued it following the discovery of uterine fibroids and pending gynecologic evaluation.   MEDICAL HISTORY:  Past Medical History:  Diagnosis Date   Abnormal Pap smear     LEEP?  LAST PAP 05/2009   Acute respiratory failure due to COVID-19 (HCC) 01/27/2020   Anemia 01/04/2012   Chlamydia    H/O candidiasis    H/O dysmenorrhea 03/13/02   H/O gonorrhea    treated   H/O menorrhagia 07/09/02   Infection 2006   GONORHRREA   Infection 2006   CHLAMYDIA   Infection    FREQ YEAST   Pelvic injury AS TEEN   SPORTS RELATED   Pelvic pain 07/11/10   Sterilization 01/04/2012   [facility] PP BTL Tubal papers signed    Trichomonas    Yeast infection     SURGICAL HISTORY: Past Surgical History:  Procedure Laterality Date   BIOPSY  11/12/2019   Procedure: BIOPSY;  Surgeon: Aneita Gwendlyn DASEN, MD;  Location: [facility];  Service: Endoscopy;;   CESAREAN SECTION  04/11/2012   Procedure: CESAREAN SECTION;  Surgeon: Jon CINDERELLA Rummer, MD;  Location: [facility];  Service: Gynecology;  Laterality: N/A;  twins   COLONOSCOPY WITH PROPOFOL  N/A 11/12/2019   Procedure: COLONOSCOPY WITH PROPOFOL ;  Surgeon:  Aneita Gwendlyn DASEN, MD;  Location: [facility];  Service: Endoscopy;  Laterality: N/A;   GYNECOLOGIC CRYOSURGERY     TUBAL LIGATION     WISDOM TOOTH EXTRACTION  2007    SOCIAL HISTORY: Social History   Socioeconomic History   Marital status: Single    Spouse name: Not on file   Number of children: 3   Years of education: 16   Highest education level: Not on file  Occupational History   Occupation: CNA  Tobacco Use   Smoking status: Never   Smokeless tobacco: Never  Vaping Use   Vaping status: Never Used  Substance and Sexual Activity   Alcohol use: Yes    Comment: occasional alcohol   Drug use: No   Sexual activity: Yes    Birth control/protection: Surgical    Comment: btl  Other Topics Concern   Not on file  Social History Narrative   Not on file   Social Drivers of Health   Tobacco Use: Low Risk (05/18/2024)   Patient History    Smoking Tobacco Use: Never    Smokeless Tobacco Use: Never    Passive Exposure: Not on file  Financial Resource Strain: Not on file  Food Insecurity: Not on file  Transportation Needs: Not on file  Physical Activity: Not on file  Stress: Not on file  Social Connections: Not on file  Intimate Partner Violence: Not on file  Depression (PHQ2-9): Low Risk (04/18/2023)   Depression (PHQ2-9)    PHQ-2 Score: 0  Alcohol Screen: Not on file  Housing: Not on file  Utilities: Not on file  Health Literacy: Not on file    FAMILY HISTORY: Family History  Problem Relation Age of Onset   Hypertension Mother    Cancer Mother 29       BREAST   Thyroid  disease Mother    Diabetes Father    Hypertension Sister    Liver disease Sister    Lupus Maternal Aunt    Alcohol abuse Maternal Aunt    Drug abuse Maternal Aunt    Cancer Maternal Grandmother 46       Stomach cancer   Hypertension Maternal Grandmother    Diabetes Maternal Grandmother    Heart disease Maternal Grandfather    Stroke Maternal Grandfather    Asthma Cousin    Anesthesia  problems Neg Hx     ALLERGIES:  is allergic to bee pollen, dust mite extract, kiwi extract, pollen extract, other, soy allergy (obsolete), and venofer  [iron  sucrose].  MEDICATIONS:  Current Outpatient Medications  Medication Sig Dispense Refill   acetaminophen  (TYLENOL ) 500 MG tablet Take 2 tablets (1,000 mg total) by mouth every 8 (eight) hours as needed.     albuterol  (VENTOLIN  HFA) 108 (90 Base) MCG/ACT inhaler Inhale 2 puffs into the lungs every 6 (six) hours as needed for wheezing or shortness of breath. 8 g 0   Cetirizine HCl (ZYRTEC PO) Take by mouth.     cyclobenzaprine  (FLEXERIL ) 10 MG tablet Take 1 tablet (10 mg total) by mouth at bedtime. 30 tablet 5   omeprazole  (PRILOSEC) 20 MG capsule Take 1 capsule (20 mg total) by mouth daily. 15 capsule 0   triamterene -hydrochlorothiazide (MAXZIDE-25) 37.5-25 MG tablet Take 0.5 tablets by mouth daily. 45 tablet 1   Vitamin D , Ergocalciferol , (DRISDOL )  1.25 MG (50000 UNIT) CAPS capsule Take 1 capsule (50,000 Units total) by mouth every 7 (seven) days. Then take 1000 units daily 12 capsule 0   ZEPBOUND  2.5 MG/0.5ML Pen Inject 2.5 mg into the skin once a week. (Patient not taking: Reported on 09/09/2024) 2 mL 0   No current facility-administered medications for this visit.      PHYSICAL EXAMINATION: ECOG PERFORMANCE STATUS: 0 - Asymptomatic  Vitals:   09/09/24 1100  BP: 130/88  Pulse: 97  Resp: 18  Temp: 98.4 F (36.9 C)  SpO2: 97%   Filed Weights   09/09/24 1100  Weight: 202 lb 14.4 oz (92 kg)    GENERAL:alert, no distress and comfortable NECK: supple, thyroid  normal size, non-tender, without nodularity LYMPH:  no palpable lymphadenopathy in the cervical, axillary  LUNGS: clear to auscultation and percussion with normal breathing effort HEART: regular rate & rhythm and no murmurs and no lower extremity edema ABDOMEN:abdomen soft, non-tender and normal bowel sounds PSYCH: alert & oriented x 3 with fluent speech NEURO: no  focal motor/sensory deficits  LABORATORY DATA:  I have reviewed the data as listed Lab Results  Component Value Date   WBC 4.3 09/09/2024   HGB 10.9 (L) 09/09/2024   HCT 33.3 (L) 09/09/2024   MCV 77.1 (L) 09/09/2024   PLT 306 09/09/2024     Chemistry      Component Value Date/Time   NA 139 09/09/2024 1118   K 3.8 09/09/2024 1118   CL 105 09/09/2024 1118   CO2 24 09/09/2024 1118   BUN 8 09/09/2024 1118   CREATININE 0.93 09/09/2024 1118   CREATININE 0.89 05/29/2013 1403      Component Value Date/Time   CALCIUM  8.8 (L) 09/09/2024 1118   ALKPHOS 101 09/09/2024 1118   AST 16 09/09/2024 1118   ALT 11 09/09/2024 1118   BILITOT 0.2 09/09/2024 1118       RADIOGRAPHIC STUDIES: I have personally reviewed the radiological images as listed and agreed with the findings in the report. No results found.  All questions were answered. The patient knows to call the clinic with any problems, questions or concerns.     Amber Stalls, MD 09/09/2024 12:12 PM

## 2024-09-17 ENCOUNTER — Ambulatory Visit: Admitting: Sports Medicine

## 2024-09-18 ENCOUNTER — Encounter: Payer: Self-pay | Admitting: Family

## 2024-09-18 ENCOUNTER — Ambulatory Visit: Admitting: Family

## 2024-09-18 VITALS — BP 150/88 | HR 84 | Ht 61.5 in | Wt 204.0 lb

## 2024-09-18 DIAGNOSIS — I10 Essential (primary) hypertension: Secondary | ICD-10-CM

## 2024-09-18 DIAGNOSIS — M62830 Muscle spasm of back: Secondary | ICD-10-CM | POA: Diagnosis not present

## 2024-09-18 MED ORDER — TRIAMTERENE-HCTZ 37.5-25 MG PO TABS: 0.5000 | ORAL_TABLET | Freq: Every day | ORAL | 1 refills | Status: AC

## 2024-09-18 MED ORDER — PREDNISONE 20 MG PO TABS: 40.0000 mg | ORAL_TABLET | Freq: Every day | ORAL | 0 refills | Status: AC

## 2024-09-18 MED ORDER — TRAMADOL HCL 50 MG PO TABS: 50.0000 mg | ORAL_TABLET | Freq: Three times a day (TID) | ORAL | 0 refills | Status: AC | PRN

## 2024-09-18 NOTE — Progress Notes (Signed)
 "  Acute Office Visit  Subjective:     Patient ID: Erika David, female    DOB: 28-May-1976, 49 y.o.   MRN: [id number]  Chief Complaint  Patient presents with   Spasms    Back spasms started week ago radiadted to middle upper back and neck left side feels like pulling hurts when breathing     HPI Patient is in today with complaints of mid back spasms and neck spasms x 1 week.  She reports the pain being a 10 out of 10.  Worse with lying down.  Has been taking muscle relaxers that have helped her to get some rest.  Denies any known injury.  No obvious swelling.  She also has a history of hypertension and has been off of her blood pressure medication since December.  She needs a medication refill.  Denies any chest pain, shortness of breath, lightheadedness or dizziness.  Review of Systems  Constitutional: Negative.   Respiratory: Negative.    Cardiovascular: Negative.   Gastrointestinal: Negative.   Musculoskeletal:  Positive for back pain.  Skin: Negative.   Neurological: Negative.   Endo/Heme/Allergies: Negative.   Psychiatric/Behavioral: Negative.    All other systems reviewed and are negative.  Past Medical History:  Diagnosis Date   Abnormal Pap smear     LEEP?  LAST PAP 05/2009   Acute respiratory failure due to COVID-19 (HCC) 01/27/2020   Anemia 01/04/2012   Chlamydia    H/O candidiasis    H/O dysmenorrhea 03/13/02   H/O gonorrhea    treated   H/O menorrhagia 07/09/02   Infection 2006   GONORHRREA   Infection 2006   CHLAMYDIA   Infection    FREQ YEAST   Pelvic injury AS TEEN   SPORTS RELATED   Pelvic pain 07/11/10   Sterilization 01/04/2012   [facility] PP BTL Tubal papers signed    Trichomonas    Yeast infection     Social History   Socioeconomic History   Marital status: Single    Spouse name: Not on file   Number of children: 3   Years of education: 16   Highest education level: Not on file  Occupational History   Occupation: CNA   Tobacco Use   Smoking status: Never   Smokeless tobacco: Never  Vaping Use   Vaping status: Never Used  Substance and Sexual Activity   Alcohol use: Yes    Comment: occasional alcohol   Drug use: No   Sexual activity: Yes    Birth control/protection: Surgical    Comment: btl  Other Topics Concern   Not on file  Social History Narrative   Not on file   Social Drivers of Health   Tobacco Use: Low Risk (09/18/2024)   Patient History    Smoking Tobacco Use: Never    Smokeless Tobacco Use: Never    Passive Exposure: Not on file  Financial Resource Strain: Not on file  Food Insecurity: Not on file  Transportation Needs: Not on file  Physical Activity: Not on file  Stress: Not on file  Social Connections: Not on file  Intimate Partner Violence: Not on file  Depression (PHQ2-9): High Risk (09/18/2024)   Depression (PHQ2-9)    PHQ-2 Score: 11  Alcohol Screen: Not on file  Housing: Not on file  Utilities: Not on file  Health Literacy: Not on file    Past Surgical History:  Procedure Laterality Date   BIOPSY  11/12/2019   Procedure: BIOPSY;  Surgeon: Aneita,  Gwendlyn DASEN, MD;  Location: [facility];  Service: Endoscopy;;   CESAREAN SECTION  04/11/2012   Procedure: CESAREAN SECTION;  Surgeon: Jon CINDERELLA Rummer, MD;  Location: [facility];  Service: Gynecology;  Laterality: N/A;  twins   COLONOSCOPY WITH PROPOFOL  N/A 11/12/2019   Procedure: COLONOSCOPY WITH PROPOFOL ;  Surgeon: Aneita Gwendlyn DASEN, MD;  Location: [facility];  Service: Endoscopy;  Laterality: N/A;   GYNECOLOGIC CRYOSURGERY     TUBAL LIGATION     WISDOM TOOTH EXTRACTION  2007    Family History  Problem Relation Age of Onset   Hypertension Mother    Cancer Mother 33       BREAST   Thyroid  disease Mother    Diabetes Father    Hypertension Sister    Liver disease Sister    Lupus Maternal Aunt    Alcohol abuse Maternal Aunt    Drug abuse Maternal Aunt    Cancer Maternal Grandmother 63        Stomach cancer   Hypertension Maternal Grandmother    Diabetes Maternal Grandmother    Heart disease Maternal Grandfather    Stroke Maternal Grandfather    Asthma Cousin    Anesthesia problems Neg Hx     Allergies[1]  Medications Ordered Prior to Encounter[2]  BP (!) 150/88 (BP Location: Left Arm, Patient Position: Sitting)   Pulse 84   Ht 5' 1.5 (1.562 m)   Wt 204 lb (92.5 kg)   LMP 09/12/2024   SpO2 98%   BMI 37.92 kg/m chart      Objective:    BP (!) 150/88 (BP Location: Left Arm, Patient Position: Sitting)   Pulse 84   Ht 5' 1.5 (1.562 m)   Wt 204 lb (92.5 kg)   LMP 09/12/2024   SpO2 98%   BMI 37.92 kg/m    Physical Exam Vitals and nursing note reviewed.  Constitutional:      Appearance: Normal appearance. She is normal weight.  Cardiovascular:     Rate and Rhythm: Normal rate and regular rhythm.     Pulses: Normal pulses.     Heart sounds: Normal heart sounds.  Pulmonary:     Effort: Pulmonary effort is normal.     Breath sounds: Normal breath sounds.  Musculoskeletal:        General: No swelling.     Cervical back: Normal range of motion and neck supple.     Comments: Thoracic spine: Pain elicited with rotation of the torso.  No obvious swelling.  Tenderness to palpation of the thoracic spine and the cervical spine.  Muscle tension noted.  Skin:    General: Skin is warm and dry.  Neurological:     General: No focal deficit present.     Mental Status: She is alert and oriented to person, place, and time. Mental status is at baseline.  Psychiatric:        Mood and Affect: Mood normal.        Behavior: Behavior normal.        Thought Content: Thought content normal.        Judgment: Judgment normal.    No results found for any visits on 09/18/24.      Assessment & Plan:   Problem List Items Addressed This Visit     Primary hypertension   Relevant Medications   triamterene -hydrochlorothiazide (MAXZIDE-25) 37.5-25 MG tablet   Other  Visit Diagnoses       Back muscle spasm    -  Primary  Meds ordered this encounter  Medications   triamterene -hydrochlorothiazide (MAXZIDE-25) 37.5-25 MG tablet    Sig: Take 0.5 tablets by mouth daily.    Dispense:  45 tablet    Refill:  1   predniSONE  (DELTASONE ) 20 MG tablet    Sig: Take 2 tablets (40 mg total) by mouth daily with breakfast.    Dispense:  10 tablet    Refill:  0   traMADol  (ULTRAM ) 50 MG tablet    Sig: Take 1 tablet (50 mg total) by mouth every 8 (eight) hours as needed for up to 5 days.    Dispense:  15 tablet    Refill:  0   Call the office with any questions or concerns.  Recheck as scheduled and sooner as needed. No follow-ups on file.  Tiquan Bouch B [place], FNP       [1] Allergies Allergen Reactions   Bee Pollen Anaphylaxis    Tree pollen in environment Tree pollen in environment   Dust Mite Extract Anaphylaxis   Kiwi Extract Itching and Swelling   Pollen Extract Anaphylaxis    Tree pollen in environment   Other Rash   Soy Allergy (Obsolete) Other (See Comments)    Per allergy testing Per allergy testing   Venofer  [Iron  Sucrose] Hives and Itching    Patient developed hives and itching at the end of 30 minute observation period. Dr. Layla aware and will add premedications to future iron  infusions.   [2] Current Outpatient Medications on File Prior to Visit  Medication Sig Dispense Refill   acetaminophen  (TYLENOL ) 500 MG tablet Take 2 tablets (1,000 mg total) by mouth every 8 (eight) hours as needed.     albuterol  (VENTOLIN  HFA) 108 (90 Base) MCG/ACT inhaler Inhale 2 puffs into the lungs every 6 (six) hours as needed for wheezing or shortness of breath. 8 g 0   Cetirizine HCl (ZYRTEC PO) Take by mouth.     cyclobenzaprine  (FLEXERIL ) 10 MG tablet Take 1 tablet (10 mg total) by mouth at bedtime. 30 tablet 5   omeprazole  (PRILOSEC) 20 MG capsule Take 1 capsule (20 mg total) by mouth daily. 15 capsule 0   Vitamin D ,  Ergocalciferol , (DRISDOL ) 1.25 MG (50000 UNIT) CAPS capsule Take 1 capsule (50,000 Units total) by mouth every 7 (seven) days. Then take 1000 units daily 12 capsule 0   ZEPBOUND  2.5 MG/0.5ML Pen Inject 2.5 mg into the skin once a week. (Patient not taking: Reported on 09/18/2024) 2 mL 0   No current facility-administered medications on file prior to visit.  "

## 2024-09-23 ENCOUNTER — Telehealth: Payer: Self-pay | Admitting: Nurse Practitioner

## 2024-09-23 NOTE — Progress Notes (Signed)
 "               Erika David D.CLEMENTEEN [facility] [address] [place] [address] Phone: [phone]   Assessment and Plan:     1. Chronic bilateral thoracic back pain (Primary) 2. Somatic dysfunction of thoracic region 3. Somatic dysfunction of rib region 4. Somatic dysfunction of upper extremities -Chronic with exacerbation, initial sports medicine visit - History of intermittent flares of back pain with acute on chronic flare of upper back pain x 1 month.  Most consistent with thoracic musculoskeletal dysfunction, pain along thoracic paraspinals, rhomboids, left scapular pain - Continue and complete prednisone  course - Continue Flexeril  5 to 10 mg nightly as needed for muscle spasms - Start Voltaren gel topically over areas of pain - Use Tylenol  500 to 1000 mg tablets 2-3 times a day for day-to-day pain relief - Patient states that she was instructed to not use NSAIDs due to history of GI issues.  On chart review, I do not see absolute contraindication to NSAID use.  Patient has history of lymphocytic colitis documented in 2021.  Could further discuss and trial NSAID course in the future if appropriate - Start HEP for upper back, scapular - Patient elected for initial OMT today.  Tolerated well per note below. - Decision today to treat with OMT was based on Physical Exam  After verbal consent patient was treated with HVLA (high velocity low amplitude), ME (muscle energy), FPR (flex positional release), ST (soft tissue),  techniques in upper extremity, rib,   areas. Patient tolerated the procedure well with improvement in symptoms.  Patient educated on potential side effects of soreness and recommended to rest, hydrate, and use Tylenol  as needed for pain control.   15 additional minutes spent for educating Therapeutic Home Exercise Program.  This included exercises focusing on stretching, strengthening, with focus on eccentric aspects.   Long term goals include an  improvement in range of motion, strength, endurance as well as avoiding reinjury. Patient's frequency would include in 1-2 times a day, 3-5 times a week for a duration of 6-12 weeks. Proper technique shown and discussed handout in great detail with ATC.  All questions were discussed and answered.      Pertinent previous records reviewed include primary care note 09/18/2024   Follow Up: 4 weeks for reevaluation.  Could consider repeat OMT.  Could consider thoracic x-ray versus physical therapy versus discussing NSAID course   Subjective:   I, Erika David, am serving as a neurosurgeon for Doctor Morene David  Chief Complaint: thoracic back pain   HPI:   09/24/2024 Patient is a 49 year old female with thoracic back pain. Patient states pain started before christmas she caught a cramp. Massage gun and RICES have not helped. Pain radiates to her sides and behind the left shoulder. Thinks it could be from her period. Tylenol  and ibu for the pain.muscles relaxer's help a little. Tramadol  is to much but helped. Decreased ROM.   Relevant Historical Information: Hypertension, history of lymphocytic colitis  Additional pertinent review of systems negative.  Current Medications[1]   Objective:     Vitals:   09/24/24 0842  BP: 138/86  Pulse: (!) 106  SpO2: 98%  Weight: 202 lb (91.6 kg)  Height: 5' 1 (1.549 m)      Body mass index is 38.17 kg/m.    Physical Exam:    Gen: Appears well, nad, nontoxic and pleasant Psych: Alert and oriented, appropriate mood and affect  Neuro: sensation intact, strength is 5/5 in upper and lower extremities, muscle tone wnl Skin: no susupicious lesions or rashes  Back - Normal skin, Spine with normal alignment and no deformity.   T4-T8 tenderness to vertebral process palpation.   T4-T8 paraspinous muscles are   tender and without spasm TTP left rhomboid, bilateral trapezius gait normal     OMT Physical Exam:   Rib: Left ribs 4-8 stuck in inhalation  on left Thoracic: TTP paraspinal, T4-8 RLSR Upper extremity: Tenderness along left scapula.  Muscle energy performed to left scapula  Electronically signed by:  Erika David D.[facility] 9:11 AM 09/24/24     [1]  Current Outpatient Medications:    acetaminophen  (TYLENOL ) 500 MG tablet, Take 2 tablets (1,000 mg total) by mouth every 8 (eight) hours as needed., Disp: , Rfl:    albuterol  (VENTOLIN  HFA) 108 (90 Base) MCG/ACT inhaler, Inhale 2 puffs into the lungs every 6 (six) hours as needed for wheezing or shortness of breath., Disp: 8 g, Rfl: 0   Cetirizine HCl (ZYRTEC PO), Take by mouth., Disp: , Rfl:    cyclobenzaprine  (FLEXERIL ) 10 MG tablet, Take 1 tablet (10 mg total) by mouth at bedtime., Disp: 30 tablet, Rfl: 5   omeprazole  (PRILOSEC) 20 MG capsule, Take 1 capsule (20 mg total) by mouth daily., Disp: 15 capsule, Rfl: 0   predniSONE  (DELTASONE ) 20 MG tablet, Take 2 tablets (40 mg total) by mouth daily with breakfast., Disp: 10 tablet, Rfl: 0   triamterene -hydrochlorothiazide (MAXZIDE-25) 37.5-25 MG tablet, Take 0.5 tablets by mouth daily., Disp: 45 tablet, Rfl: 1   Vitamin D , Ergocalciferol , (DRISDOL ) 1.25 MG (50000 UNIT) CAPS capsule, Take 1 capsule (50,000 Units total) by mouth every 7 (seven) days. Then take 1000 units daily, Disp: 12 capsule, Rfl: 0  "

## 2024-09-23 NOTE — Telephone Encounter (Signed)
"   CLINICAL USE BELOW THIS LINE (use X to signify taken)  ____Form received and placed in providers office for signature. _X___Form completed and faxed to [facility]. _X___Form completed & sent MyChart message to patient that forms are ready for pick up _X___Charge sheet & copy of form in front office folder for office supervisor.   "

## 2024-09-23 NOTE — Telephone Encounter (Signed)
CLINICAL USE BELOW THIS LINE (use X to signify taken)  __X__Form received and placed in providers office for signature. ____Form completed and faxed to LOA Dept. ____Form completed & LVM to notify pt ready for pick up ____Charge sheet & copy of form in front office folder for office supervisor.   

## 2024-09-23 NOTE — Telephone Encounter (Signed)
 Patient dropped off document Accomodation Certification , to be filled out by provider. Patient requested to send it back via Fax within 7-days. Document is located in providers tray at front office.Please advise at Mobile [phone] (mobile)   Pt wants document faxed to the fax number included in the document, and also wants a copy for herself.

## 2024-09-24 ENCOUNTER — Ambulatory Visit: Admitting: Sports Medicine

## 2024-09-24 VITALS — BP 138/86 | HR 106 | Ht 61.0 in | Wt 202.0 lb

## 2024-09-24 DIAGNOSIS — M9908 Segmental and somatic dysfunction of rib cage: Secondary | ICD-10-CM | POA: Diagnosis not present

## 2024-09-24 DIAGNOSIS — G8929 Other chronic pain: Secondary | ICD-10-CM

## 2024-09-24 DIAGNOSIS — M9907 Segmental and somatic dysfunction of upper extremity: Secondary | ICD-10-CM

## 2024-09-24 DIAGNOSIS — M546 Pain in thoracic spine: Secondary | ICD-10-CM

## 2024-09-24 DIAGNOSIS — M9902 Segmental and somatic dysfunction of thoracic region: Secondary | ICD-10-CM

## 2024-09-24 NOTE — Patient Instructions (Signed)
 Continue and complete prednisone  course   Continue flexeril  nightly as needed for muscle spasm  Voltaren gel over areas of pain   Thoracic HEP   4 week follow up

## 2024-09-29 ENCOUNTER — Telehealth: Payer: Self-pay | Admitting: Nurse Practitioner

## 2024-09-29 NOTE — Telephone Encounter (Signed)
 Patient dropped off document FMLA, to be filled out by provider. Patient requested to send it back via Fax within 7-days. Document is located in providers tray at front office.Please advise at [phone]   FMLA paperwork came through fax.  I put in the dr box

## 2024-09-30 NOTE — Telephone Encounter (Signed)
 Called patient and informed her why I was calling. She informed me that Erika David should not have received that paperwork that it should have gone to her OBGYN office. I asked patient for the name of her GYN office. Information given to me was [facility]. I informed her that I will fax it to their office. She thanked me for calling and for faxing it to the office that it needs to go to.

## 2024-09-30 NOTE — Telephone Encounter (Signed)
CLINICAL USE BELOW THIS LINE (use X to signify taken)  __X__Form received and placed in providers office for signature. ____Form completed and faxed to LOA Dept. ____Form completed & LVM to notify pt ready for pick up ____Charge sheet & copy of form in front office folder for office supervisor.   

## 2024-09-30 NOTE — Telephone Encounter (Signed)
 Faxed FMLA form to [employer], [place]

## 2024-10-01 ENCOUNTER — Encounter: Payer: Self-pay | Admitting: Nurse Practitioner

## 2024-10-22 ENCOUNTER — Ambulatory Visit: Admitting: Sports Medicine

## 2024-11-11 ENCOUNTER — Ambulatory Visit (HOSPITAL_COMMUNITY): Admit: 2024-11-11 | Admitting: Student

## 2024-11-11 DIAGNOSIS — D259 Leiomyoma of uterus, unspecified: Secondary | ICD-10-CM

## 2024-11-11 SURGERY — HYSTERECTOMY, TOTAL, LAPAROSCOPIC, ROBOT-ASSISTED WITH SALPINGECTOMY
Anesthesia: General

## 2025-03-09 ENCOUNTER — Inpatient Hospital Stay: Admitting: Hematology and Oncology

## 2025-03-09 ENCOUNTER — Inpatient Hospital Stay
